# Patient Record
Sex: Female | Born: 1965
Health system: Southern US, Community
[De-identification: ages and names within clinical notes are randomized; demographics above are authoritative.]

## PROBLEM LIST (undated history)

## (undated) DIAGNOSIS — F329 Major depressive disorder, single episode, unspecified: Secondary | ICD-10-CM

## (undated) DIAGNOSIS — I10 Essential (primary) hypertension: Secondary | ICD-10-CM

## (undated) DIAGNOSIS — B009 Herpesviral infection, unspecified: Secondary | ICD-10-CM

## (undated) DIAGNOSIS — I509 Heart failure, unspecified: Secondary | ICD-10-CM

## (undated) DIAGNOSIS — B019 Varicella without complication: Secondary | ICD-10-CM

## (undated) DIAGNOSIS — D649 Anemia, unspecified: Secondary | ICD-10-CM

## (undated) DIAGNOSIS — F32A Depression, unspecified: Secondary | ICD-10-CM

## (undated) DIAGNOSIS — F419 Anxiety disorder, unspecified: Secondary | ICD-10-CM

## (undated) DIAGNOSIS — N760 Acute vaginitis: Secondary | ICD-10-CM

## (undated) DIAGNOSIS — B9689 Other specified bacterial agents as the cause of diseases classified elsewhere: Secondary | ICD-10-CM

## (undated) DIAGNOSIS — E559 Vitamin D deficiency, unspecified: Secondary | ICD-10-CM

## (undated) HISTORY — DX: Anemia, unspecified: D64.9

## (undated) HISTORY — DX: Varicella without complication: B01.9

## (undated) HISTORY — DX: Vitamin D deficiency, unspecified: E55.9

## (undated) HISTORY — DX: Major depressive disorder, single episode, unspecified: F32.9

## (undated) HISTORY — DX: Herpesviral infection, unspecified: B00.9

## (undated) HISTORY — DX: Depression, unspecified: F32.A

## (undated) HISTORY — DX: Other specified bacterial agents as the cause of diseases classified elsewhere: B96.89

## (undated) HISTORY — DX: Heart failure, unspecified: I50.9

## (undated) HISTORY — PX: FRACTURE SURGERY: SHX138

## (undated) HISTORY — PX: AUGMENTATION MAMMAPLASTY: SUR837

## (undated) HISTORY — PX: APPENDECTOMY: SHX54

## (undated) HISTORY — DX: Essential (primary) hypertension: I10

## (undated) HISTORY — DX: Anxiety disorder, unspecified: F41.9

## (undated) HISTORY — DX: Other specified bacterial agents as the cause of diseases classified elsewhere: N76.0

---

## 2004-10-19 ENCOUNTER — Encounter: Payer: Self-pay | Admitting: Orthopedic Surgery

## 2004-10-30 ENCOUNTER — Encounter: Payer: Self-pay | Admitting: Orthopedic Surgery

## 2007-05-22 ENCOUNTER — Other Ambulatory Visit: Payer: Self-pay

## 2007-05-22 ENCOUNTER — Emergency Department: Payer: Self-pay | Admitting: Emergency Medicine

## 2013-08-30 ENCOUNTER — Emergency Department: Payer: Self-pay | Admitting: Emergency Medicine

## 2016-09-28 ENCOUNTER — Emergency Department
Admission: EM | Admit: 2016-09-28 | Discharge: 2016-09-28 | Disposition: A | Discharge status: Home / Self Care | Payer: BLUE CROSS/BLUE SHIELD | Attending: Emergency Medicine | Admitting: Emergency Medicine

## 2016-09-28 ENCOUNTER — Encounter: Payer: Self-pay | Admitting: Emergency Medicine

## 2016-09-28 DIAGNOSIS — F17200 Nicotine dependence, unspecified, uncomplicated: Secondary | ICD-10-CM | POA: Insufficient documentation

## 2016-09-28 DIAGNOSIS — R112 Nausea with vomiting, unspecified: Secondary | ICD-10-CM | POA: Diagnosis present

## 2016-09-28 DIAGNOSIS — R197 Diarrhea, unspecified: Secondary | ICD-10-CM | POA: Diagnosis not present

## 2016-09-28 LAB — URINALYSIS, COMPLETE (UACMP) WITH MICROSCOPIC
Bacteria, UA: NONE SEEN
Bilirubin Urine: NEGATIVE
Glucose, UA: NEGATIVE mg/dL
Ketones, ur: NEGATIVE mg/dL
Leukocytes, UA: NEGATIVE
Nitrite: NEGATIVE
Protein, ur: NEGATIVE mg/dL
Specific Gravity, Urine: 1.016 (ref 1.005–1.030)
pH: 5 (ref 5.0–8.0)

## 2016-09-28 LAB — COMPREHENSIVE METABOLIC PANEL
ALT: 15 U/L (ref 14–54)
AST: 24 U/L (ref 15–41)
Albumin: 4.3 g/dL (ref 3.5–5.0)
Alkaline Phosphatase: 72 U/L (ref 38–126)
Anion gap: 8 (ref 5–15)
BUN: 17 mg/dL (ref 6–20)
CO2: 24 mmol/L (ref 22–32)
Calcium: 9.1 mg/dL (ref 8.9–10.3)
Chloride: 102 mmol/L (ref 101–111)
Creatinine, Ser: 0.67 mg/dL (ref 0.44–1.00)
GFR calc Af Amer: >60 mL/min (ref >60)
GFR calc non Af Amer: >60 mL/min (ref >60)
Glucose, Bld: 97 mg/dL (ref 65–99)
Potassium: 3.8 mmol/L (ref 3.5–5.1)
Sodium: 134 mmol/L — ABNORMAL LOW (ref 135–145)
Total Bilirubin: 0.8 mg/dL (ref 0.3–1.2)
Total Protein: 8 g/dL (ref 6.5–8.1)

## 2016-09-28 LAB — CBC
HCT: 44.2 % (ref 35.0–47.0)
Hemoglobin: 15.2 g/dL (ref 12.0–16.0)
MCH: 32.2 pg (ref 26.0–34.0)
MCHC: 34.3 g/dL (ref 32.0–36.0)
MCV: 93.8 fL (ref 80.0–100.0)
Platelets: 302 10*3/uL (ref 150–440)
RBC: 4.71 MIL/uL (ref 3.80–5.20)
RDW: 13.1 % (ref 11.5–14.5)
WBC: 7.9 10*3/uL (ref 3.6–11.0)

## 2016-09-28 LAB — LIPASE, BLOOD: Lipase: 24 U/L (ref 11–51)

## 2016-09-28 MED ORDER — ONDANSETRON HCL 4 MG/2ML IJ SOLN
4.0000 mg | Freq: Once | INTRAMUSCULAR | Status: AC
  Administered 2016-09-28: 4 mg via INTRAVENOUS
  Filled 2016-09-28: qty 2

## 2016-09-28 MED ORDER — SODIUM CHLORIDE 0.9 % IV BOLUS (SEPSIS)
1000.0000 mL | Freq: Once | INTRAVENOUS | Status: AC
  Administered 2016-09-28: 1000 mL via INTRAVENOUS

## 2016-09-28 MED ORDER — LOPERAMIDE HCL 2 MG PO CAPS
2.0000 mg | ORAL_CAPSULE | ORAL | Status: AC
  Administered 2016-09-28: 2 mg via ORAL
  Filled 2016-09-28: qty 1

## 2016-09-28 MED ORDER — ONDANSETRON 4 MG PO TBDP: 4.0000 mg | ORAL_TABLET | Freq: Four times a day (QID) | ORAL | 0 refills | Status: DC | PRN

## 2016-09-28 NOTE — Discharge Instructions (Signed)

## 2016-09-28 NOTE — ED Triage Notes (Signed)
Patient to ER for N/V/D x 2 days. Patient states a couple people at work have been sick and have had to go home. Patient reports 2 episodes per day. Patient states she feels fine, but then eats and vomits 45 mins later.

## 2016-09-28 NOTE — ED Provider Notes (Signed)
[facility] Emergency Department Provider Note   ____________________________________________   First MD Initiated Contact with Patient 09/28/16 0800     (approximate)  I have reviewed the triage vital signs and the nursing notes.   HISTORY  Chief Complaint Emesis and Diarrhea    HPI Shelly Stevenson is a 51 y.o. female reports she became sick on about Wednesday. Several people she is worked with have been sick with nausea and vomiting recently. She reports on Wednesday morning after eating cereal she suddenly became very nauseated and began to vomit multiple times. She's vomited several times each day mostly when trying to eat. She reports that yesterday she did vomit 2 times. Vomits up food and water. No black or bloody vomit or diarrhea. She does reports she's had loose stools, a couple daily for the last few days. Denies being in any pain or discomfort however, except uncomfortable when she does vomit.  No fevers or chills. No abdominal pain. Denies pregnancy. No chest pain or trouble breathing. Reports she feels "dehydrated".   History reviewed. No pertinent past medical history.  There are no active problems to display for this patient.   Past Surgical History:  Procedure Laterality Date  . APPENDECTOMY    . FRACTURE SURGERY     Left thumb    Prior to Admission medications   Medication Sig Start Date End Date Taking? Authorizing Provider  ondansetron (ZOFRAN ODT) 4 MG disintegrating tablet Take 1 tablet (4 mg total) by mouth every 6 (six) hours as needed for nausea or vomiting. 09/28/16   Sharyn Creamer, MD    Allergies Penicillins  No family history on file.  Social History Social History  Substance Use Topics  . Smoking status: Current Every Day Smoker  . Smokeless tobacco: Never Used  . Alcohol use No    Review of Systems Constitutional: No fever/chills Eyes: No visual changes. ENT: No sore throat. Cardiovascular: Denies chest  pain. Respiratory: Denies shortness of breath. Gastrointestinal: No abdominal pain.  Genitourinary: Negative for dysuria. Musculoskeletal: Negative for back pain. Skin: Negative for rash. Neurological: Negative for headaches, focal weakness or numbness.    ____________________________________________   PHYSICAL EXAM:  VITAL SIGNS: ED Triage Vitals  Enc Vitals Group     BP 09/28/16 0714 (!) 146/94     Pulse Rate 09/28/16 0714 77     Resp 09/28/16 0714 18     Temp 09/28/16 0714 98.4 F (36.9 C)     Temp Source 09/28/16 0714 Oral     SpO2 09/28/16 0714 97 %     Weight 09/28/16 0716 155 lb (70.3 kg)     Height 09/28/16 0716 5\' 5"  (1.651 m)     Head Circumference --      Peak Flow --      Pain Score --      Pain Loc --      Pain Edu? --      Excl. in GC? --     Constitutional: Alert and oriented. Well appearing and in no acute distress.Very pleasant. Eyes: Conjunctivae are normal. Head: Atraumatic. Nose: No congestion/rhinnorhea. Mouth/Throat: Mucous membranes are moist though slightly tacky. Neck: No stridor.   Cardiovascular: Normal rate, regular rhythm. Grossly normal heart sounds.  Good peripheral circulation. Respiratory: Normal respiratory effort.  No retractions. Lungs CTAB. Gastrointestinal: Soft and nontender. No distention. Negative Murphy. No rebound or guarding in any quadrant. Careful abdominal exam reveals no tenderness or discomfort elicited. No pain in McBurney's point. Musculoskeletal: No  lower extremity tenderness nor edema. Neurologic:  Normal speech and language. No gross focal neurologic deficits are appreciated.  Skin:  Skin is warm, dry and intact. No rash noted. Psychiatric: Mood and affect are normal. Speech and behavior are normal.  ____________________________________________   LABS (all labs ordered are listed, but only abnormal results are displayed)  Labs Reviewed  COMPREHENSIVE METABOLIC PANEL - Abnormal; Notable for the following:        Result Value   Sodium 134 (*)    All other components within normal limits  URINALYSIS, COMPLETE (UACMP) WITH MICROSCOPIC - Abnormal; Notable for the following:    Color, Urine YELLOW (*)    APPearance CLEAR (*)    Hgb urine dipstick MODERATE (*)    Squamous Epithelial / LPF 0-5 (*)    All other components within normal limits  LIPASE, BLOOD  CBC   ____________________________________________  EKG   ____________________________________________  RADIOLOGY   ____________________________________________   PROCEDURES  Procedure(s) performed: None  Procedures  Critical Care performed: No  ____________________________________________   INITIAL IMPRESSION / ASSESSMENT AND PLAN / ED COURSE  Pertinent labs & imaging results that were available during my care of the patient were reviewed by me and considered in my medical decision making (see chart for details).  Nausea vomiting and diarrhea. Nontoxic and well-appearing. Afebrile. Very reassuring examination with no evidence of an acute abdomen noted. Patient reports no pain but reports nausea with vomiting after eating and loose stools. Most likely a self-limited situation possibly a foodborne illness or gastroenteritis. No risk factors for C. difficile or bacterial infectious diarrheas.  Discussed with patient, we will hydrate her carefully. Reassess for ability to take by mouth prior to plans for discharge with careful return precautions.  ----------------------------------------- 9:24 AM on 09/28/2016 -----------------------------------------  Patient resting comfortably. No complaints or concerns.   I discussed careful return precautions with the patient. She is very agreeable.      ____________________________________________   FINAL CLINICAL IMPRESSION(S) / ED DIAGNOSES  Final diagnoses:  Nausea vomiting and diarrhea      NEW MEDICATIONS STARTED DURING THIS VISIT:  New Prescriptions   ONDANSETRON  (ZOFRAN ODT) 4 MG DISINTEGRATING TABLET    Take 1 tablet (4 mg total) by mouth every 6 (six) hours as needed for nausea or vomiting.     Note:  This document was prepared using Dragon voice recognition software and may include unintentional dictation errors.     Sharyn CreamerQuale, Tanja Gift, MD 09/28/16 [phone]

## 2017-02-17 ENCOUNTER — Other Ambulatory Visit: Payer: Self-pay | Admitting: Family Medicine

## 2017-02-17 DIAGNOSIS — Z1231 Encounter for screening mammogram for malignant neoplasm of breast: Secondary | ICD-10-CM

## 2017-03-19 ENCOUNTER — Emergency Department
Admission: EM | Admit: 2017-03-19 | Discharge: 2017-03-19 | Disposition: A | Discharge status: Home / Self Care | Payer: BLUE CROSS/BLUE SHIELD | Attending: Emergency Medicine | Admitting: Emergency Medicine

## 2017-03-19 ENCOUNTER — Encounter: Payer: Self-pay | Admitting: Emergency Medicine

## 2017-03-19 DIAGNOSIS — F17200 Nicotine dependence, unspecified, uncomplicated: Secondary | ICD-10-CM | POA: Insufficient documentation

## 2017-03-19 DIAGNOSIS — B349 Viral infection, unspecified: Secondary | ICD-10-CM | POA: Diagnosis not present

## 2017-03-19 DIAGNOSIS — J069 Acute upper respiratory infection, unspecified: Secondary | ICD-10-CM | POA: Diagnosis not present

## 2017-03-19 DIAGNOSIS — B9789 Other viral agents as the cause of diseases classified elsewhere: Secondary | ICD-10-CM

## 2017-03-19 DIAGNOSIS — R0981 Nasal congestion: Secondary | ICD-10-CM | POA: Diagnosis present

## 2017-03-19 MED ORDER — BENZONATATE 200 MG PO CAPS: 200.0000 mg | ORAL_CAPSULE | Freq: Three times a day (TID) | ORAL | 0 refills | Status: DC | PRN

## 2017-03-19 NOTE — Discharge Instructions (Signed)
Follow-up with your regular doctor or the acute care if you are worsening in 3-5 days, take it over-the-counter medications as needed, a prescription for Jerilynn Somessalon Perles has been given to you there for cough, return to the emergency department if your worsening

## 2017-03-19 NOTE — ED Triage Notes (Signed)
Patient presents to ED via POV from home. Patient states for the past week she has had the flu, she was diagnosed by her mother who is a Engineer, civil (consulting)nurse. Patient here today because she is still not feeling better. Patient reports nasal congestion. Afebrile here.

## 2017-03-19 NOTE — ED Provider Notes (Signed)
[facility] Emergency Department Provider Note  ____________________________________________   First MD Initiated Contact with Patient 03/19/17 1053     (approximate)  I have reviewed the triage vital signs and the nursing notes.   HISTORY  Chief Complaint Nasal Congestion    HPI Shelly Stevenson is a 51 y.o. female complains of cough and congestion with fever, states her fever broke last night, her temperature had been as high as 103, states she felt like she had the flu that she had the flu vaccine, the mucus is mainly clear but with some yellow, states she is feeling better than she did yesterday and will need a note to return to work, she denies vomiting or diarrhea  History reviewed. No pertinent past medical history.  There are no active problems to display for this patient.   Past Surgical History:  Procedure Laterality Date  . APPENDECTOMY    . FRACTURE SURGERY     Left thumb    Prior to Admission medications   Medication Sig Start Date End Date Taking? Authorizing Provider  benzonatate (TESSALON) 200 MG capsule Take 1 capsule (200 mg total) by mouth 3 (three) times daily as needed for cough. 03/19/17   Sherrie MustacheFisher, Roselyn BeringSusan W, PA-C  ondansetron (ZOFRAN ODT) 4 MG disintegrating tablet Take 1 tablet (4 mg total) by mouth every 6 (six) hours as needed for nausea or vomiting. 09/28/16   Sharyn CreamerQuale, Mark, MD    Allergies Penicillins  No family history on file.  Social History Social History   Tobacco Use  . Smoking status: Current Every Day Smoker  . Smokeless tobacco: Never Used  Substance Use Topics  . Alcohol use: No  . Drug use: No    Review of Systems  Constitutional: Positive fever/chills Eyes: No visual changes. ENT: No sore throat. Respiratory: Positive cough Genitourinary: Negative for dysuria. Musculoskeletal: Negative for back pain. Skin: Negative for rash.    ____________________________________________   PHYSICAL  EXAM:  VITAL SIGNS: ED Triage Vitals  Enc Vitals Group     BP 03/19/17 1040 (!) 153/101     Pulse Rate 03/19/17 1040 78     Resp 03/19/17 1040 15     Temp 03/19/17 1040 98 F (36.7 C)     Temp src --      SpO2 03/19/17 1040 99 %     Weight 03/19/17 1040 160 lb (72.6 kg)     Height 03/19/17 1040 5\' 6"  (1.676 m)     Head Circumference --      Peak Flow --      Pain Score 03/19/17 1101 3     Pain Loc --      Pain Edu? --      Excl. in GC? --     Constitutional: Alert and oriented. Well appearing and in no acute distress. Eyes: Conjunctivae are normal.  Head: Atraumatic. Nose: No congestion/rhinnorhea. Mouth/Throat: Mucous membranes are moist.  Throat is normal Cardiovascular: Normal rate, regular rhythm.  Heart sounds are normal Respiratory: Normal respiratory effort.  No retractions, lungs are clear to auscultation GU: deferred Musculoskeletal: FROM all extremities, warm and well perfused Neurologic:  Normal speech and language.  Skin:  Skin is warm, dry and intact. No rash noted. Psychiatric: Mood and affect are normal. Speech and behavior are normal.  ____________________________________________   LABS (all labs ordered are listed, but only abnormal results are displayed)  Labs Reviewed - No data to display ____________________________________________   ____________________________________________  RADIOLOGY  ____________________________________________   PROCEDURES  Procedure(s) performed: No      ____________________________________________   INITIAL IMPRESSION / ASSESSMENT AND PLAN / ED COURSE  Pertinent labs & imaging results that were available during my care of the patient were reviewed by me and considered in my medical decision making (see chart for details).  Patient is a 51 year old female who has flulike symptoms, since she has been sick for so many days a flu swab was not done, she states she feels better and is afebrile today, she  basically needs a work note and something for the cough, diagnosis is acute upper respiratory infection viral, she is to follow-up with her regular doctor if she is not better in 3-5 days, a work note was given, a prescription for [employer] Petroleum[employer]alon Perles 200 mg 3 times a day as needed for cough was also given, patient states she understands and will comply with the discharge instructions, she was discharged in stable condition      ____________________________________________   FINAL CLINICAL IMPRESSION(S) / ED DIAGNOSES  Final diagnoses:  Viral URI with cough      NEW MEDICATIONS STARTED DURING THIS VISIT:  This SmartLink is deprecated. Use AVSMEDLIST instead to display the medication list for a patient.   Note:  This document was prepared using Dragon voice recognition software and may include unintentional dictation errors.    Faythe GheeFisher, Susan W, PA-C 03/19/17 1119    Emily FilbertWilliams, Jonathan E, MD 03/19/17 [phone]

## 2017-03-19 NOTE — ED Notes (Signed)
Mask applied.

## 2017-03-24 ENCOUNTER — Other Ambulatory Visit (HOSPITAL_COMMUNITY)
Admission: RE | Admit: 2017-03-24 | Discharge: 2017-03-24 | Disposition: A | Discharge status: Home / Self Care | Payer: BLUE CROSS/BLUE SHIELD | Source: Ambulatory Visit | Attending: Internal Medicine | Admitting: Internal Medicine

## 2017-03-24 ENCOUNTER — Ambulatory Visit: Payer: BLUE CROSS/BLUE SHIELD | Admitting: Internal Medicine

## 2017-03-24 ENCOUNTER — Encounter: Payer: Self-pay | Admitting: Internal Medicine

## 2017-03-24 VITALS — BP 140/90 | HR 98 | Temp 98.1°F | Ht 65.25 in | Wt 169.1 lb

## 2017-03-24 DIAGNOSIS — F419 Anxiety disorder, unspecified: Secondary | ICD-10-CM | POA: Diagnosis not present

## 2017-03-24 DIAGNOSIS — Z0000 Encounter for general adult medical examination without abnormal findings: Secondary | ICD-10-CM

## 2017-03-24 DIAGNOSIS — M2550 Pain in unspecified joint: Secondary | ICD-10-CM | POA: Diagnosis not present

## 2017-03-24 DIAGNOSIS — Z113 Encounter for screening for infections with a predominantly sexual mode of transmission: Secondary | ICD-10-CM

## 2017-03-24 DIAGNOSIS — Z1211 Encounter for screening for malignant neoplasm of colon: Secondary | ICD-10-CM | POA: Insufficient documentation

## 2017-03-24 DIAGNOSIS — Z780 Asymptomatic menopausal state: Secondary | ICD-10-CM

## 2017-03-24 DIAGNOSIS — D649 Anemia, unspecified: Secondary | ICD-10-CM

## 2017-03-24 DIAGNOSIS — R232 Flushing: Secondary | ICD-10-CM | POA: Diagnosis not present

## 2017-03-24 DIAGNOSIS — G4700 Insomnia, unspecified: Secondary | ICD-10-CM | POA: Diagnosis not present

## 2017-03-24 DIAGNOSIS — Z1231 Encounter for screening mammogram for malignant neoplasm of breast: Secondary | ICD-10-CM | POA: Diagnosis not present

## 2017-03-24 DIAGNOSIS — F339 Major depressive disorder, recurrent, unspecified: Secondary | ICD-10-CM | POA: Diagnosis not present

## 2017-03-24 DIAGNOSIS — Z1239 Encounter for other screening for malignant neoplasm of breast: Secondary | ICD-10-CM

## 2017-03-24 DIAGNOSIS — R252 Cramp and spasm: Secondary | ICD-10-CM

## 2017-03-24 DIAGNOSIS — G5603 Carpal tunnel syndrome, bilateral upper limbs: Secondary | ICD-10-CM

## 2017-03-24 DIAGNOSIS — L409 Psoriasis, unspecified: Secondary | ICD-10-CM

## 2017-03-24 LAB — CBC WITH DIFFERENTIAL/PLATELET
Basophils Absolute: 62 cells/uL (ref 0–200)
Basophils Relative: 0.9 %
Eosinophils Absolute: 207 cells/uL (ref 15–500)
Eosinophils Relative: 3 %
HCT: 41 % (ref 35.0–45.0)
Hemoglobin: 14 g/dL (ref 11.7–15.5)
Lymphs Abs: 1787 cells/uL (ref 850–3900)
MCH: 31.8 pg (ref 27.0–33.0)
MCHC: 34.1 g/dL (ref 32.0–36.0)
MCV: 93.2 fL (ref 80.0–100.0)
MPV: 8.7 fL (ref 7.5–12.5)
Monocytes Relative: 7.8 %
Neutro Abs: 4306 cells/uL (ref 1500–7800)
Neutrophils Relative %: 62.4 %
Platelets: 284 10*3/uL (ref 140–400)
RBC: 4.4 10*6/uL (ref 3.80–5.10)
RDW: 12.9 % (ref 11.0–15.0)
Total Lymphocyte: 25.9 %
WBC mixed population: 538 cells/uL (ref 200–950)
WBC: 6.9 10*3/uL (ref 3.8–10.8)

## 2017-03-24 LAB — COMPREHENSIVE METABOLIC PANEL
AG Ratio: 1.6 (calc) (ref 1.0–2.5)
ALT: 14 U/L (ref 6–29)
AST: 18 U/L (ref 10–35)
Albumin: 4.9 g/dL (ref 3.6–5.1)
Alkaline phosphatase (APISO): 85 U/L (ref 33–130)
BUN: 15 mg/dL (ref 7–25)
CO2: 29 mmol/L (ref 20–32)
Calcium: 9.7 mg/dL (ref 8.6–10.4)
Chloride: 100 mmol/L (ref 98–110)
Creat: 0.75 mg/dL (ref 0.50–1.05)
Globulin: 3 g/dL (calc) (ref 1.9–3.7)
Glucose, Bld: 100 mg/dL — ABNORMAL HIGH (ref 65–99)
Potassium: 4.4 mmol/L (ref 3.5–5.3)
Sodium: 135 mmol/L (ref 135–146)
Total Bilirubin: 0.3 mg/dL (ref 0.2–1.2)
Total Protein: 7.9 g/dL (ref 6.1–8.1)

## 2017-03-24 MED ORDER — ZOLPIDEM TARTRATE 5 MG PO TABS: 5.0000 mg | ORAL_TABLET | Freq: Every evening | ORAL | 1 refills | Status: DC | PRN

## 2017-03-24 MED ORDER — ESCITALOPRAM OXALATE 10 MG PO TABS: 10.0000 mg | ORAL_TABLET | Freq: Every day | ORAL | 0 refills | Status: DC

## 2017-03-24 MED ORDER — TRIAMCINOLONE ACETONIDE 0.1 % EX CREA: TOPICAL_CREAM | CUTANEOUS | 1 refills | Status: DC

## 2017-03-24 MED ORDER — VENLAFAXINE HCL ER 37.5 MG PO CP24: 37.5000 mg | ORAL_CAPSULE | Freq: Every day | ORAL | 1 refills | Status: DC

## 2017-03-24 MED ORDER — CLOBETASOL PROPIONATE 0.05 % EX SOLN: 1.0000 "application " | Freq: Two times a day (BID) | CUTANEOUS | 0 refills | Status: DC | PRN

## 2017-03-24 MED ORDER — ALPRAZOLAM 0.5 MG PO TABS: 0.5000 mg | ORAL_TABLET | Freq: Every day | ORAL | 1 refills | Status: DC | PRN

## 2017-03-24 NOTE — Patient Instructions (Addendum)
Try Zeasorb AF Powder over the counter for sweating to keep you dry under breasts  Take Lexapro 10 mg x 1 week then stop and start Effexor daily in am    Venlafaxine extended-release capsules What is this medicine? VENLAFAXINE(VEN la fax een) is used to treat depression, anxiety and panic disorder. This medicine may be used for other purposes; ask your health care provider or pharmacist if you have questions. COMMON BRAND NAME(S): Effexor XR What should I tell my health care provider before I take this medicine? They need to know if you have any of these conditions: -bleeding disorders -glaucoma -heart disease -high blood pressure -high cholesterol -kidney disease -liver disease -low levels of sodium in the blood -mania or bipolar disorder -seizures -suicidal thoughts, plans, or attempt; a previous suicide attempt by you or a family -take medicines that treat or prevent blood clots -thyroid disease -an unusual or allergic reaction to venlafaxine, desvenlafaxine, other medicines, foods, dyes, or preservatives -pregnant or trying to get pregnant -breast-feeding How should I use this medicine? Take this medicine by mouth with a full glass of water. Follow the directions on the prescription label. Do not cut, crush, or chew this medicine. Take it with food. If needed, the capsule may be carefully opened and the entire contents sprinkled on a spoonful of cool applesauce. Swallow the applesauce/pellet mixture right away without chewing and follow with a glass of water to ensure complete swallowing of the pellets. Try to take your medicine at about the same time each day. Do not take your medicine more often than directed. Do not stop taking this medicine suddenly except upon the advice of your doctor. Stopping this medicine too quickly may cause serious side effects or your condition may worsen. A special MedGuide will be given to you by the pharmacist with each prescription and refill. Be sure  to read this information carefully each time. Talk to your pediatrician regarding the use of this medicine in children. Special care may be needed. Overdosage: If you think you have taken too much of this medicine contact a poison control center or emergency room at once. NOTE: This medicine is only for you. Do not share this medicine with others. What if I miss a dose? If you miss a dose, take it as soon as you can. If it is almost time for your next dose, take only that dose. Do not take double or extra doses. What may interact with this medicine? Do not take this medicine with any of the following medications: -certain medicines for fungal infections like fluconazole, itraconazole, ketoconazole, posaconazole, voriconazole -cisapride -desvenlafaxine -dofetilide -dronedarone -duloxetine -levomilnacipran -linezolid -MAOIs like Carbex, Eldepryl, Marplan, Nardil, and Parnate -methylene blue (injected into a vein) -milnacipran -pimozide -thioridazine -ziprasidone This medicine may also interact with the following medications: -amphetamines -aspirin and aspirin-like medicines -certain medicines for depression, anxiety, or psychotic disturbances -certain medicines for migraine headaches like almotriptan, eletriptan, frovatriptan, naratriptan, rizatriptan, sumatriptan, zolmitriptan -certain medicines for sleep -certain medicines that treat or prevent blood clots like dalteparin, enoxaparin, warfarin -cimetidine -clozapine -diuretics -fentanyl -furazolidone -indinavir -isoniazid -lithium -metoprolol -NSAIDS, medicines for pain and inflammation, like ibuprofen or naproxen -other medicines that prolong the QT interval (cause an abnormal heart rhythm) -procarbazine -rasagiline -supplements like St. John's wort, kava kava, valerian -tramadol -tryptophan This list may not describe all possible interactions. Give your health care provider a list of all the medicines, herbs,  non-prescription drugs, or dietary supplements you use. Also tell them if you smoke, drink alcohol,  or use illegal drugs. Some items may interact with your medicine. What should I watch for while using this medicine? Tell your doctor if your symptoms do not get better or if they get worse. Visit your doctor or health care professional for regular checks on your progress. Because it may take several weeks to see the full effects of this medicine, it is important to continue your treatment as prescribed by your doctor. Patients and their families should watch out for new or worsening thoughts of suicide or depression. Also watch out for sudden changes in feelings such as feeling anxious, agitated, panicky, irritable, hostile, aggressive, impulsive, severely restless, overly excited and hyperactive, or not being able to sleep. If this happens, especially at the beginning of treatment or after a change in dose, call your health care professional. This medicine can cause an increase in blood pressure. Check with your doctor for instructions on monitoring your blood pressure while taking this medicine. You may get drowsy or dizzy. Do not drive, use machinery, or do anything that needs mental alertness until you know how this medicine affects you. Do not stand or sit up quickly, especially if you are an older patient. This reduces the risk of dizzy or fainting spells. Alcohol may interfere with the effect of this medicine. Avoid alcoholic drinks. Your mouth may get dry. Chewing sugarless gum, sucking hard candy and drinking plenty of water will help. Contact your doctor if the problem does not go away or is severe. What side effects may I notice from receiving this medicine? Side effects that you should report to your doctor or health care professional as soon as possible: -allergic reactions like skin rash, itching or hives, swelling of the face, lips, or tongue -anxious -breathing  problems -confusion -changes in vision -chest pain -confusion -elevated mood, decreased need for sleep, racing thoughts, impulsive behavior -eye pain -fast, irregular heartbeat -feeling faint or lightheaded, falls -feeling agitated, angry, or irritable -hallucination, loss of contact with reality -high blood pressure -loss of balance or coordination -palpitations -redness, blistering, peeling or loosening of the skin, including inside the mouth -restlessness, pacing, inability to keep still -seizures -stiff muscles -suicidal thoughts or other mood changes -trouble passing urine or change in the amount of urine -trouble sleeping -unusual bleeding or bruising -unusually weak or tired -vomiting Side effects that usually do not require medical attention (report to your doctor or health care professional if they continue or are bothersome): -change in sex drive or performance -change in appetite or weight -constipation -dizziness -dry mouth -headache -increased sweating -nausea -tired This list may not describe all possible side effects. Call your doctor for medical advice about side effects. You may report side effects to FDA at 1-800-FDA-1088. Where should I keep my medicine? Keep out of the reach of children. Store at a controlled temperature between 20 and 25 degrees C (68 degrees and 77 degrees F), in a dry place. Throw away any unused medicine after the expiration date. NOTE: This sheet is a summary. It may not cover all possible information. If you have questions about this medicine, talk to your doctor, pharmacist, or health care provider.  2018 Elsevier/Gold Standard (2015-08-17 18:38:02)  Intertrigo Intertrigo is skin irritation (inflammation) that happens in warm, moist areas of the body. The irritation can cause a rash and make skin raw and itchy. The rash is usually pink or red. It happens mostly between folds of skin or where skin rubs together, such  as:  Toes.  Armpits.  Groin.  Belly.  Breasts.  Buttocks.  This condition is not passed from person to person (is not contagious). Follow these instructions at home:  Keep the affected area clean and dry.  Do not scratch your skin.  Stay cool as much as possible. Use an air conditioner or fan, if you can.  Apply over-the-counter and prescription medicines only as told by your doctor.  If you were prescribed an antibiotic medicine, use it as told by your doctor. Do not stop using the antibiotic even if your condition starts to get better.  Keep all follow-up visits as told by your doctor. This is important. How is this prevented?  Stay at a healthy weight.  Keep your feet dry. This is very important if you have diabetes. Wear cotton or wool socks.  Take care of and protect the skin in your groin and butt area as told by your doctor.  Do not wear tight clothes. Wear clothes that: ? Are loose. ? Take away moisture from your body. ? Are made of cotton.  Wear a bra that gives good support, if needed.  Shower and dry yourself fully after being active.  Keep your blood sugar under control if you have diabetes. Contact a doctor if:  Your symptoms do not get better with treatment.  Your symptoms get worse or they spread.  You notice more redness and warmth.  You have a fever. This information is not intended to replace advice given to you by your health care provider. Make sure you discuss any questions you have with your health care provider. Document Released: 04/20/2010 Document Revised: 08/24/2015 Document Reviewed: 09/19/2014 Elsevier Interactive Patient Education  [employer]2018 [employer]

## 2017-03-24 NOTE — Progress Notes (Signed)
Chief Complaint  Patient presents with  . Establish Care   New patient establish care 1. Agreeable to STD check  2. Psoriasis to scalp, ears, b/l elbows, face, dx'ed age 51 y.o  3. C/o muscle cramps in thighs at times and lower leg edema with working 12 hour shifts 4. C/o numbness/tingling b/l hands she thinks she has CTS b/l  5. Anxiety/depression/stress/memory/insomnia problems uncontrolled but she wants to go off Lexapro on 20 mg qd now PHQ 9 score 11. She has appt with Dr. Suzie Portela 04/2017 but last time she called he could not work her in and she does not want to see him again. She takes Xanax 0.5 1/2 to 1 pill qd prn and Ambien 10 mg qhs for sleep. Advised her max dose for women is 5 mg and will change the dose. She has tried Melatonin in the past it gave her nightmares, Buspar caused wt gain , nausea, Zoloft and Paxil did not work.  6. C/o hot flashes and spotted with cycle in the summer. She wants to try medication for hot flashes   Review of Systems  Constitutional: Negative for fever and weight loss.  HENT: Negative for hearing loss.   Eyes:       No vision problems   Respiratory: Negative for shortness of breath.   Cardiovascular: Positive for leg swelling. Negative for chest pain.  Gastrointestinal: Negative for abdominal pain and blood in stool.  Genitourinary:       +hot flashes   Musculoskeletal: Positive for joint pain.  Skin: Positive for rash.  Psychiatric/Behavioral: Positive for depression and memory loss. The patient is nervous/anxious and has insomnia.    Past Medical History:  Diagnosis Date  . Anemia    with PICA sx's   . Anxiety   . Chicken pox   . Depression    Past Surgical History:  Procedure Laterality Date  . APPENDECTOMY     51 y.o.   . FRACTURE SURGERY     Left thumb   Family History  Problem Relation Age of Onset  . Arthritis Mother   . Cancer Mother        breast  . Hearing loss Mother   . Hypertension Mother   . Arthritis Father   .  Cancer Father        colon dx'ed age 45s   . Hypertension Father   . Heart disease Father        MI  . Diabetes Father   . Mental illness Son        [facility]   Social History   Socioeconomic History  . Marital status: Divorced    Spouse name: Not on file  . Number of children: Not on file  . Years of education: Not on file  . Highest education level: Not on file  Social Needs  . Financial resource strain: Not on file  . Food insecurity - worry: Not on file  . Food insecurity - inability: Not on file  . Transportation needs - medical: Not on file  . Transportation needs - non-medical: Not on file  Occupational History  . Not on file  Tobacco Use  . Smoking status: Current Every Day Smoker  . Smokeless tobacco: Never Used  . Tobacco comment: <1ppd x 30 years no FH lung cancer   Substance and Sexual Activity  . Alcohol use: Yes    Comment: socially per pt   . Drug use: No  . Sexual activity: Yes  Comment: men  Other Topics Concern  . 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 ALPRAZolam (XANAX) 0.5 MG tablet Take 1 tablet (0.5 mg total) by mouth daily as needed for anxiety.  . benzonatate (TESSALON) 200 MG capsule Take 1 capsule (200 mg total) by mouth 3 (three) times daily as needed for cough.  . escitalopram (LEXAPRO) 10 MG tablet Take 1 tablet (10 mg total) by mouth daily. X 7 days then stop  . ondansetron (ZOFRAN ODT) 4 MG disintegrating tablet Take 1 tablet (4 mg total) by mouth every 6 (six) hours as needed for nausea or vomiting.  Marland Kitchen. zolpidem (AMBIEN) 5 MG tablet Take 1 tablet (5 mg total) by mouth at bedtime as needed for sleep.  . [DISCONTINUED] ALPRAZolam (XANAX) 0.5 MG tablet   . [DISCONTINUED] escitalopram (LEXAPRO) 20 MG tablet   . [DISCONTINUED] zolpidem (AMBIEN) 10 MG tablet    Allergies   Allergen Reactions  . Penicillins Swelling    Angioedema  . Codeine Rash    Elevated heartrate   No results found for this or any previous visit (from the past 2160 hour(s)). Objective  Body mass index is 27.93 kg/m. Wt Readings from Last 3 Encounters:  03/24/17 169 lb 2 oz (76.7 kg)  03/19/17 160 lb (72.6 kg)  09/28/16 155 lb (70.3 kg)   Temp Readings from Last 3 Encounters:  03/24/17 98.1 F (36.7 C) (Oral)  03/19/17 98 F (36.7 C)  09/28/16 98.4 F (36.9 C) (Oral)   BP Readings from Last 3 Encounters:  03/24/17 140/90  03/19/17 (!) 143/97  09/28/16 (!) 157/73   Pulse Readings from Last 3 Encounters:  03/24/17 98  03/19/17 78  09/28/16 63   O2 sat room air 96%  Physical Exam  Constitutional: She is oriented to person, place, and time and well-developed, well-nourished, and in no distress. Vital signs are normal.  HENT:  Head: Normocephalic and atraumatic.  Mouth/Throat: Oropharynx is clear and moist and mucous membranes are normal.  Mild flakiness behind right ear   Eyes: Conjunctivae are normal. Pupils are equal, round, and reactive to light.  Cardiovascular: Normal rate, regular rhythm and normal heart sounds.  No murmur heard. Neg leg edema b/l   Pulmonary/Chest: Effort normal and breath sounds normal.  Abdominal: Soft. Bowel sounds are normal. There is no tenderness.  Neurological: She is alert and oriented to person, place, and time. Gait normal. Gait normal.  Skin: Skin is warm, dry and intact.  Psychiatric: Mood, memory, affect and judgment normal.  Nursing note and vitals reviewed.   Assessment   1. Wellness  2. Anxiety/depression PHQ 9 score 11 today/insomnia/memory problems  3. Psoriasis ? Psoriatic arthritis worse on elbows b/l r/o other  4. Muscle cramps  5. C/w CTS b/l  6. Menopausal sx's  7. HM  Plan  1.  Check labs CMET, CBC, UA, TSH, T4, FSH, anemia labs, RA labs, hep B, STD check  Will need to check lipid at f/u not fasting   2. Taper off lexapro 20 >10 mg qd. Start effexor 37.5 room to inc. If needed in future Reduced ambien from 10 mg to 5 qhs prn  Prn Xanax 0.5   Refer to [facility] further tx Does not want to see Dr. Suzie PortelaMoffitt any longer  3. W/u as above  Trial TMC if this does not help increase strength with different agent   Clobetasol to scalp prn   Refer to dermatology   4. Labs as above  Increase hydration  5.  Rx wrist splints qhs and at work  6.  Check FSH sx's  Trial effexor XL 7.  Had flu and Tdap  Disc shingrix pt wants to hold on getting mom got shingles after shingles vaccine   Smoker <1ppd x 30 years no FH lung cancer does not want to quit rec cessation.   Drinks etoh "socially", no drugs   Will do pap at f/u no h/o abnormal per pt cycles irregular and spotted this summer   Refer colonoscopy Dr. Servando SnareWohl  Refer dermatology [facility]   [facility]   rec Zeasorb AF to intertrigo areas   Provider: Dr. French Anaracy McLean-Scocuzza-[facility]

## 2017-03-25 LAB — URINALYSIS, ROUTINE W REFLEX MICROSCOPIC
Bacteria, UA: NONE SEEN /HPF
Bilirubin Urine: NEGATIVE
Glucose, UA: NEGATIVE
Hyaline Cast: NONE SEEN /LPF
Ketones, ur: NEGATIVE
Leukocytes, UA: NEGATIVE
Nitrite: NEGATIVE
Protein, ur: NEGATIVE
Specific Gravity, Urine: 1.015 (ref 1.001–1.03)
Squamous Epithelial / LPF: NONE SEEN /HPF (ref <=5)
WBC, UA: NONE SEEN /HPF (ref 0–5)
pH: 7 (ref 5.0–8.0)

## 2017-03-26 ENCOUNTER — Other Ambulatory Visit: Payer: Self-pay | Admitting: Internal Medicine

## 2017-03-26 DIAGNOSIS — Z113 Encounter for screening for infections with a predominantly sexual mode of transmission: Secondary | ICD-10-CM

## 2017-03-27 LAB — URINE CYTOLOGY ANCILLARY ONLY
Chlamydia: NEGATIVE
Neisseria Gonorrhea: NEGATIVE
Trichomonas: NEGATIVE

## 2017-03-29 LAB — TSH: TSH: 3.04 mIU/L

## 2017-03-29 LAB — ANA: Anti Nuclear Antibody(ANA): POSITIVE — AB

## 2017-03-29 LAB — HSV 1 ANTIBODY, IGG: HSV 1 Glycoprotein G Ab, IgG: <0.9 index

## 2017-03-29 LAB — HIV ANTIBODY (ROUTINE TESTING W REFLEX): HIV 1&2 Ab, 4th Generation: NONREACTIVE

## 2017-03-29 LAB — IRON,TIBC AND FERRITIN PANEL
%SAT: 22 % (calc) (ref 11–50)
Ferritin: 32 ng/mL (ref 10–232)
Iron: 89 ug/dL (ref 45–160)
TIBC: 408 mcg/dL (calc) (ref 250–450)

## 2017-03-29 LAB — HSV 2 ANTIBODY, IGG: HSV 2 Glycoprotein G Ab, IgG: 23 index — ABNORMAL HIGH

## 2017-03-29 LAB — SEDIMENTATION RATE: Sed Rate: 11 mm/h (ref 0–30)

## 2017-03-29 LAB — TEST AUTHORIZATION

## 2017-03-29 LAB — HSV 1/2 AB (IGM), IFA W/RFLX TITER
HSV 1 IgM Screen: NEGATIVE
HSV 2 IgM Screen: NEGATIVE

## 2017-03-29 LAB — HEPATITIS B SURFACE ANTIGEN: Hepatitis B Surface Ag: NONREACTIVE

## 2017-03-29 LAB — T4, FREE: Free T4: 1 ng/dL (ref 0.8–1.8)

## 2017-03-29 LAB — HEPATITIS C ANTIBODY
Hepatitis C Ab: NONREACTIVE
SIGNAL TO CUT-OFF: 0.05 (ref <1.00)

## 2017-03-29 LAB — CYCLIC CITRUL PEPTIDE ANTIBODY, IGG: Cyclic Citrullin Peptide Ab: <16 UNITS

## 2017-03-29 LAB — RHEUMATOID FACTOR: Rhuematoid fact SerPl-aCnc: <14 IU/mL (ref <14)

## 2017-03-29 LAB — ANTI-NUCLEAR AB-TITER (ANA TITER): ANA Titer 1: 1:80 {titer} — ABNORMAL HIGH

## 2017-03-29 LAB — C-REACTIVE PROTEIN: CRP: 2.5 mg/L (ref <8.0)

## 2017-03-29 LAB — RPR: RPR Ser Ql: NONREACTIVE

## 2017-03-29 LAB — HEPATITIS B CORE ANTIBODY, TOTAL: Hep B Core Total Ab: NONREACTIVE

## 2017-03-29 LAB — HEPATITIS B SURFACE ANTIBODY, QUANTITATIVE: Hepatitis B-Post: <5 m[IU]/mL — ABNORMAL LOW (ref >=10)

## 2017-03-29 LAB — FOLLICLE STIMULATING HORMONE: FSH: 86.4 m[IU]/mL

## 2017-04-02 ENCOUNTER — Other Ambulatory Visit: Payer: Self-pay | Admitting: Internal Medicine

## 2017-04-02 DIAGNOSIS — N760 Acute vaginitis: Principal | ICD-10-CM

## 2017-04-02 DIAGNOSIS — B9689 Other specified bacterial agents as the cause of diseases classified elsewhere: Secondary | ICD-10-CM

## 2017-04-02 LAB — URINE CYTOLOGY ANCILLARY ONLY: Candida vaginitis: NEGATIVE

## 2017-04-02 MED ORDER — METRONIDAZOLE 500 MG PO TABS: 500.0000 mg | ORAL_TABLET | Freq: Two times a day (BID) | ORAL | 0 refills | Status: DC

## 2017-04-15 ENCOUNTER — Other Ambulatory Visit: Payer: Self-pay

## 2017-04-15 DIAGNOSIS — Z1211 Encounter for screening for malignant neoplasm of colon: Secondary | ICD-10-CM

## 2017-04-16 ENCOUNTER — Telehealth: Payer: Self-pay

## 2017-04-16 NOTE — Telephone Encounter (Signed)
Gastroenterology Pre-Procedure Review  Request Date: 05/02/17 Requesting Physician: Dr. Maximino Greenlandahiliani  PATIENT REVIEW QUESTIONS: The patient responded to the following health history questions as indicated:    1. Are you having any GI issues? no 2. Do you have a personal history of Polyps? no 3. Do you have a family history of Colon Cancer or Polyps? yes (Dad colon cancer) 4. Diabetes Mellitus? no 5. Joint replacements in the past 12 months?no 6. Major health problems in the past 3 months?no 7. Any artificial heart valves, MVP, or defibrillator?no    MEDICATIONS & ALLERGIES:    Patient reports the following regarding taking any anticoagulation/antiplatelet therapy:   Plavix, Coumadin, Eliquis, Xarelto, Lovenox, Pradaxa, Brilinta, or Effient? no Aspirin? no  Patient confirms/reports the following medications:  Current Outpatient Medications  Medication Sig Dispense Refill  . ALPRAZolam (XANAX) 0.5 MG tablet Take 1 tablet (0.5 mg total) by mouth daily as needed for anxiety. 30 tablet 1  . benzonatate (TESSALON) 200 MG capsule Take 1 capsule (200 mg total) by mouth 3 (three) times daily as needed for cough. 30 capsule 0  . clobetasol (TEMOVATE) 0.05 % external solution Apply 1 application topically 2 (two) times daily as needed. Scalp 50 mL 0  . escitalopram (LEXAPRO) 10 MG tablet Take 1 tablet (10 mg total) by mouth daily. X 7 days then stop 7 tablet 0  . metroNIDAZOLE (FLAGYL) 500 MG tablet Take 1 tablet (500 mg total) by mouth 2 (two) times daily. 14 tablet 0  . ondansetron (ZOFRAN ODT) 4 MG disintegrating tablet Take 1 tablet (4 mg total) by mouth every 6 (six) hours as needed for nausea or vomiting. 20 tablet 0  . triamcinolone cream (KENALOG) 0.1 % Bid elbows avoid face/groin/private 454 g 1  . venlafaxine XR (EFFEXOR XR) 37.5 MG 24 hr capsule Take 1 capsule (37.5 mg total) by mouth daily with breakfast. 30 capsule 1  . zolpidem (AMBIEN) 5 MG tablet Take 1 tablet (5 mg total) by mouth  at bedtime as needed for sleep. 30 tablet 1   No current facility-administered medications for this visit.     Patient confirms/reports the following allergies:  Allergies  Allergen Reactions  . Penicillins Swelling    Angioedema  . Codeine Rash    Elevated heartrate    No orders of the defined types were placed in this encounter.   AUTHORIZATION INFORMATION Primary Insurance: 1D#: Group #:  Secondary Insurance: 1D#: Group #:  SCHEDULE INFORMATION: Date: 05/02/17 Time: Location:[facility]

## 2017-04-28 ENCOUNTER — Other Ambulatory Visit (HOSPITAL_COMMUNITY)
Admission: RE | Admit: 2017-04-28 | Discharge: 2017-04-28 | Disposition: A | Discharge status: Home / Self Care | Payer: BLUE CROSS/BLUE SHIELD | Source: Ambulatory Visit | Attending: Internal Medicine | Admitting: Internal Medicine

## 2017-04-28 ENCOUNTER — Ambulatory Visit (INDEPENDENT_AMBULATORY_CARE_PROVIDER_SITE_OTHER): Payer: BLUE CROSS/BLUE SHIELD | Admitting: Internal Medicine

## 2017-04-28 ENCOUNTER — Encounter: Payer: Self-pay | Admitting: Internal Medicine

## 2017-04-28 VITALS — BP 136/90 | HR 76 | Temp 98.4°F | Ht 62.5 in | Wt 175.2 lb

## 2017-04-28 DIAGNOSIS — F419 Anxiety disorder, unspecified: Secondary | ICD-10-CM

## 2017-04-28 DIAGNOSIS — R7689 Other specified abnormal immunological findings in serum: Secondary | ICD-10-CM | POA: Insufficient documentation

## 2017-04-28 DIAGNOSIS — Z13220 Encounter for screening for lipoid disorders: Secondary | ICD-10-CM | POA: Diagnosis not present

## 2017-04-28 DIAGNOSIS — Z124 Encounter for screening for malignant neoplasm of cervix: Secondary | ICD-10-CM

## 2017-04-28 DIAGNOSIS — R768 Other specified abnormal immunological findings in serum: Secondary | ICD-10-CM | POA: Insufficient documentation

## 2017-04-28 DIAGNOSIS — Z23 Encounter for immunization: Secondary | ICD-10-CM

## 2017-04-28 MED ORDER — ALPRAZOLAM 0.5 MG PO TABS: 0.5000 mg | ORAL_TABLET | Freq: Every day | ORAL | 1 refills | Status: DC | PRN

## 2017-04-28 NOTE — Patient Instructions (Signed)
Please follow in 4 months  Ive already referred you to dermatology, psychiatry and mammogram [facility] these  Take care   Hepatitis B Vaccine, Recombinant injection What is this medicine? HEPATITIS B VACCINE (hep uh TAHY tis B VAK seen) is a vaccine. It is used to prevent an infection with the hepatitis B virus. This medicine may be used for other purposes; ask your health care provider or pharmacist if you have questions. COMMON BRAND NAME(S): Engerix-B, Recombivax HB What should I tell my health care provider before I take this medicine? They need to know if you have any of these conditions: -fever, infection -heart disease -hepatitis B infection -immune system problems -kidney disease -an unusual or allergic reaction to vaccines, yeast, other medicines, foods, dyes, or preservatives -pregnant or trying to get pregnant -breast-feeding How should I use this medicine? This vaccine is for injection into a muscle. It is given by a health care professional. A copy of Vaccine Information Statements will be given before each vaccination. Read this sheet carefully each time. The sheet may change frequently. Talk to your pediatrician regarding the use of this medicine in children. While this drug may be prescribed for children as young as newborn for selected conditions, precautions do apply. Overdosage: If you think you have taken too much of this medicine contact a poison control center or emergency room at once. NOTE: This medicine is only for you. Do not share this medicine with others. What if I miss a dose? It is important not to miss your dose. Call your doctor or health care professional if you are unable to keep an appointment. What may interact with this medicine? -medicines that suppress your immune function like adalimumab, anakinra, infliximab -medicines to treat cancer -steroid medicines like prednisone or cortisone This list may not describe all possible  interactions. Give your health care provider a list of all the medicines, herbs, non-prescription drugs, or dietary supplements you use. Also tell them if you smoke, drink alcohol, or use illegal drugs. Some items may interact with your medicine. What should I watch for while using this medicine? See your health care provider for all shots of this vaccine as directed. You must have 3 shots of this vaccine for protection from hepatitis B infection. Tell your doctor right away if you have any serious or unusual side effects after getting this vaccine. What side effects may I notice from receiving this medicine? Side effects that you should report to your doctor or health care professional as soon as possible: -allergic reactions like skin rash, itching or hives, swelling of the face, lips, or tongue -breathing problems -confused, irritated -fast, irregular heartbeat -flu-like syndrome -numb, tingling pain -seizures -unusually weak or tired Side effects that usually do not require medical attention (report to your doctor or health care professional if they continue or are bothersome): -diarrhea -fever -headache -loss of appetite -muscle pain -nausea -pain, redness, swelling, or irritation at site where injected -tiredness This list may not describe all possible side effects. Call your doctor for medical advice about side effects. You may report side effects to FDA at 1-800-FDA-1088. Where should I keep my medicine? This drug is given in a hospital or clinic and will not be stored at home. NOTE: This sheet is a summary. It may not cover all possible information. If you have questions about this medicine, talk to your doctor, pharmacist, or health care provider.  2018 Elsevier/Gold Standard (2013-07-19 13:26:01)

## 2017-04-28 NOTE — Progress Notes (Signed)
Chief Complaint  Patient presents with  . Follow-up   Follow up  1. Wants hep B injection  2. Reviewed labs HSV 2 + already knew about exposure per pt and had only 1 outbreak, +ANA 1:80 titer needs to f/u with rheumatology in the future  3. H/o BV last noted on urine tests tx'ed Flagyl x 1 week    Review of Systems  Constitutional: Negative for weight loss.  HENT: Negative for hearing loss.   Respiratory: Negative for shortness of breath.   Cardiovascular: Negative for chest pain.  Gastrointestinal: Negative for abdominal pain.  Skin: Negative for rash.  Psychiatric/Behavioral: The patient is nervous/anxious.    Past Medical History:  Diagnosis Date  . Anemia    with PICA sx's   . Anxiety   . Chicken pox   . Depression   . HSV (herpes simplex virus) infection    Past Surgical History:  Procedure Laterality Date  . APPENDECTOMY     52 y.o.   . FRACTURE SURGERY     Left thumb   Family History  Problem Relation Age of Onset  . Arthritis Mother   . Cancer Mother        breast  . Hearing loss Mother   . Hypertension Mother   . Arthritis Father   . Cancer Father        colon dx'ed age 6s   . Hypertension Father   . Heart disease Father        MI  . Diabetes Father   . Mental illness Son        [facility]   Social History   Socioeconomic History  . Marital status: Divorced    Spouse name: Not on file  . Number of children: Not on file  . Years of education: Not on file  . Highest education level: Not on file  Social Needs  . Financial resource strain: Not on file  . Food insecurity - worry: Not on file  . Food insecurity - inability: Not on file  . Transportation needs - medical: Not on file  . Transportation needs - non-medical: Not on file  Occupational History  . Not on file  Tobacco Use  . Smoking status: Current Every Day Smoker  . Smokeless tobacco: Never Used  . Tobacco comment: <1ppd x 30 years no FH lung cancer   Substance and Sexual Activity  .  Alcohol use: Yes    Comment: socially per pt   . Drug use: No  . Sexual activity: Yes    Comment: men  Other Topics Concern  . 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 ALPRAZolam (XANAX) 0.5 MG tablet Take 1 tablet (0.5 mg total) by mouth daily as needed for anxiety.  . benzonatate (TESSALON) 200 MG capsule Take 1 capsule (200 mg total) by mouth 3 (three) times daily as needed for cough.  . clobetasol (TEMOVATE) 0.05 % external solution Apply 1 application topically 2 (two) times daily as needed. Scalp  . ondansetron (ZOFRAN ODT) 4 MG disintegrating tablet Take 1 tablet (4 mg total) by mouth every 6 (six) hours as needed for nausea or vomiting.  . triamcinolone cream (KENALOG) 0.1 % Bid elbows avoid face/groin/private  .  venlafaxine XR (EFFEXOR XR) 37.5 MG 24 hr capsule Take 1 capsule (37.5 mg total) by mouth daily with breakfast.  . zolpidem (AMBIEN) 5 MG tablet Take 1 tablet (5 mg total) by mouth at bedtime as needed for sleep.  . [DISCONTINUED] ALPRAZolam (XANAX) 0.5 MG tablet Take 1 tablet (0.5 mg total) by mouth daily as needed for anxiety.   Allergies  Allergen Reactions  . Penicillins Swelling    Angioedema  . Codeine Rash    Elevated heartrate   Recent Results (from the past 2160 hour(s))  Urine cytology ancillary only     Status: None   Collection Time: 03/24/17 12:00 AM  Result Value Ref Range   Chlamydia Negative     Comment: Normal Reference Range - Negative   Neisseria gonorrhea Negative     Comment: Normal Reference Range - Negative   Trichomonas Negative     Comment: Normal Reference Range - Negative  Urine cytology ancillary only     Status: Abnormal   Collection Time: 03/24/17 12:00 AM  Result Value Ref Range   Bacterial vaginitis (A)     **POSITIVE for Atopobium vaginae, POSITIVE for  Megasphaera 1, POSITIVE for Gardnerella vaginalis, POSITIVE for BVAB2**    Comment: Normal Reference Range - Negative   Candida vaginitis Negative for Candida Vaginitis Microorganisms     Comment: Normal Reference Range - Negative  Urinalysis, Routine w reflex microscopic     Status: Abnormal   Collection Time: 03/24/17 12:13 PM  Result Value Ref Range   Color, Urine YELLOW YELLOW   APPearance CLEAR CLEAR   Specific Gravity, Urine 1.015 1.001 - 1.03   pH 7.0 5.0 - 8.0   Glucose, UA NEGATIVE NEGATIVE   Bilirubin Urine NEGATIVE NEGATIVE   Ketones, ur NEGATIVE NEGATIVE   Hgb urine dipstick TRACE (A) NEGATIVE   Protein, ur NEGATIVE NEGATIVE   Nitrite NEGATIVE NEGATIVE   Leukocytes, UA NEGATIVE NEGATIVE   WBC, UA NONE SEEN 0 - 5 /HPF   RBC / HPF 0-2 0 - 2 /HPF   Squamous Epithelial / LPF NONE SEEN < OR = 5 /HPF   Bacteria, UA NONE SEEN NONE SEEN /HPF   Hyaline Cast NONE SEEN NONE SEEN /LPF  TSH     Status: None   Collection Time: 03/24/17 12:14 PM  Result Value Ref Range   TSH 3.04 mIU/L    Comment:           Reference Range .           > or = 20 Years  0.40-4.50 .                Pregnancy Ranges           First trimester    0.26-2.66           Second trimester   0.55-2.73           Third trimester    0.43-2.91   T4, free     Status: None   Collection Time: 03/24/17 12:14 PM  Result Value Ref Range   Free T4 1.0 0.8 - 1.8 ng/dL  Antinuclear Antib (ANA)     Status: Abnormal   Collection Time: 03/24/17 12:14 PM  Result Value Ref Range   Anit Nuclear Antibody(ANA) POSITIVE (A) NEGATIVE    Comment: ANA IFA is a first line screen for detecting the presence of up to approximately 150 autoantibodies in various autoimmune diseases. A positive ANA IFA result is suggestive of autoimmune  disease and reflexes to titer and pattern. Further laboratory testing may be considered if clinically indicated. . Visit Physician FAQs for interpretation of all antibodies in the [place],  prevalence, and association with diseases at http://education.[facility].com/ [email or web address] .   Sedimentation rate     Status: None   Collection Time: 03/24/17 12:14 PM  Result Value Ref Range   Sed Rate 11 0 - 30 mm/h  C-reactive protein     Status: None   Collection Time: 03/24/17 12:14 PM  Result Value Ref Range   CRP 2.5 <8.0 mg/L  Cyclic citrul peptide antibody, IgG     Status: None   Collection Time: 03/24/17 12:14 PM  Result Value Ref Range   Cyclic Citrullin Peptide Ab <40 UNITS    Comment: Reference Range Negative:            <20 Weak Positive:       20-39 Moderate Positive:   40-59 Strong Positive:     >59 .   Rheumatoid Factor     Status: None   Collection Time: 03/24/17 12:14 PM  Result Value Ref Range   Rhuematoid fact SerPl-aCnc <14 <14 IU/mL  FSH     Status: None   Collection Time: 03/24/17 12:14 PM  Result Value Ref Range   FSH 86.4 mIU/mL    Comment:                     Reference Range .              Follicular Phase       2.5-10.2              Mid-cycle Peak         3.1-17.7              Luteal Phase           1.5- 9.1              Postmenopausal       23.0-116.3              .   Iron, TIBC and Ferritin Panel     Status: None   Collection Time: 03/24/17 12:14 PM  Result Value Ref Range   Iron 89 45 - 160 mcg/dL   TIBC 981 191 - 478 mcg/dL (calc)   %SAT 22 11 - 50 % (calc)   Ferritin 32 10 - 232 ng/mL  RPR     Status: None   Collection Time: 03/24/17 12:14 PM  Result Value Ref Range   RPR Ser Ql NON-REACTIVE NON-REACTI  HIV antibody     Status: None   Collection Time: 03/24/17 12:14 PM  Result Value Ref Range   HIV 1&2 Ab, 4th Generation NON-REACTIVE NON-REACTI    Comment: HIV-1 antigen and HIV-1/HIV-2 antibodies were not detected. There is no laboratory evidence of HIV infection. Marland Kitchen PLEASE NOTE: This information has been disclosed to you from records whose confidentiality may be protected by state law.  If your state requires  such protection, then the state law prohibits you from making any further disclosure of the information without the specific written consent of the person to whom it pertains, or as otherwise permitted by law. A general authorization for the release of medical or other information is NOT sufficient for this purpose. . For additional information please refer to http://education.[facility].com/faq/FAQ106 (This link is being provided for informational/ educational purposes only.) . Marland Kitchen The performance of this assay has not been  clinically validated in patients less than 63 years old. .   Hepatitis B surface antibody     Status: Abnormal   Collection Time: 03/24/17 12:14 PM  Result Value Ref Range   Hepatitis B-Post <5 (L) > OR = 10 mIU/mL    Comment: . Patient does not have immunity to hepatitis B virus. . For additional information, please refer to http://education.[facility].com/faq/FAQ105 (This link is being provided for informational/ educational purposes only).   Hepatitis B core antibody, total     Status: None   Collection Time: 03/24/17 12:14 PM  Result Value Ref Range   Hep B Core Total Ab NON-REACTIVE NON-REACTI  Hepatitis C antibody     Status: None   Collection Time: 03/24/17 12:14 PM  Result Value Ref Range   Hepatitis C Ab NON-REACTIVE NON-REACTI   SIGNAL TO CUT-OFF 0.05 <1.00  Hepatitis B surface antigen     Status: None   Collection Time: 03/24/17 12:14 PM  Result Value Ref Range   Hepatitis B Surface Ag NON-REACTIVE NON-REACTI  HSV 2 antibody, IgG     Status: Abnormal   Collection Time: 03/24/17 12:14 PM  Result Value Ref Range   HSV 2 Glycoprotein G Ab, IgG 23.00 (H) index    Comment:                           Index          Interpretation                           -----          --------------                           <0.90          Negative                           0.90-1.09      Equivocal                           >1.09           Positive . This assay utilizes recombinant type-specific antigens to differentiate HSV-1 from HSV-2 infections. A positive result cannot distinguish between recent and past infection. If recent HSV infection is suspected but the results are negative or equivocal, the assay should be repeated in 4-6 weeks. The performance characteristics of the assay have not been established for pediatric populations, immunocompromised patients, or neonatal screening.   HSV 1/2 Ab (IgM), IFA w/rflx Titer     Status: None   Collection Time: 03/24/17 12:14 PM  Result Value Ref Range   HSV 1 IgM Screen Negative Negative   HSV 2 IgM Screen Negative Negative    Comment: . The IFA procedure for measuring IgM antibodies to HSV 1 and HSV 2 detects both type-common and type-specific HSV antibodies. Thus, IgM reactivity to both HSV 1 and HSV 2 may represent crossreactive HSV antibodies rather than exposure to both HSV 1 and HSV 2. . This test was developed and its analytical performance characteristics have been determined by [employer], [place], [place]. It has not been cleared or approved by the FDA. This assay has been validated pursuant to the CLIA regulations and is used for clinical purposes. Marland Kitchen  HSV 1 antibody, IgG     Status: None   Collection Time: 03/24/17 12:14 PM  Result Value Ref Range   HSV 1 Glycoprotein G Ab, IgG <0.90 index    Comment:                           Index          Interpretation                           -----          --------------                           <0.90          Negative                           0.90-1.09      Equivocal                           >1.09          Positive . This assay utilizes recombinant type-specific antigens to differentiate HSV-1 from HSV-2 infections. A positive result cannot distinguish between recent and past infection. If recent HSV infection is suspected but the results are negative or equivocal, the  assay should be repeated in 4-6 weeks. The performance characteristics of the assay have not been established for pediatric populations, immunocompromised patients, or neonatal screening.   TEST AUTHORIZATION     Status: None   Collection Time: 03/24/17 12:14 PM  Result Value Ref Range   TEST NAME: HSV 1 IGG, TYPE SPECIFIC HSV     TEST CODE: [id number]    CLIENT CONTACT: LATOYA WRIGHT    REPORT ALWAYS MESSAGE SIGNATURE      Comment: . The laboratory testing on this patient was verbally requested or confirmed by the ordering physician or his or her authorized representative after contact with an employee of [employer]. Federal regulations require that we maintain on file written authorization for all laboratory testing.  Accordingly we are asking that the ordering physician or his or her authorized representative sign a copy of this report and promptly return it to the client service representative. . . Signature:____________________________________________________ . Please fax this signed page to [phone] or return it via your [employer] courier.   Anti-nuclear ab-titer (ANA titer)     Status: Abnormal   Collection Time: 03/24/17 12:14 PM  Result Value Ref Range   ANA Pattern 1 NUCLEOLAR (A)     Comment: Nucleolar pattern is associated with systemic sclerosis (scleroderma), systemic sclerosis/ polymyositis overlap and Sjogren's syndrome.    ANA Titer 1 1:80 (H) titer    Comment: A low level ANA titer may be present in pre-clinical autoimmune diseases and normal individuals.                 Reference Range                 <1:40        Negative                 1:40-1:80    Low Antibody Level                 >1:80  Elevated Antibody Level .   Comprehensive metabolic panel     Status: Abnormal   Collection Time: 03/24/17 12:16 PM  Result Value Ref Range   Glucose, Bld 100 (H) 65 - 99 mg/dL    Comment: .            Fasting reference  interval . For someone without known diabetes, a glucose value between 100 and 125 mg/dL is consistent with prediabetes and should be confirmed with a follow-up test. .    BUN 15 7 - 25 mg/dL   Creat 1.61 0.96 - 0.45 mg/dL    Comment: For patients >19 years of age, the reference limit for Creatinine is approximately 13% higher for people identified as African-American. .    BUN/Creatinine Ratio NOT APPLICABLE 6 - 22 (calc)   Sodium 135 135 - 146 mmol/L   Potassium 4.4 3.5 - 5.3 mmol/L   Chloride 100 98 - 110 mmol/L   CO2 29 20 - 32 mmol/L   Calcium 9.7 8.6 - 10.4 mg/dL   Total Protein 7.9 6.1 - 8.1 g/dL   Albumin 4.9 3.6 - 5.1 g/dL   Globulin 3.0 1.9 - 3.7 g/dL (calc)   AG Ratio 1.6 1.0 - 2.5 (calc)   Total Bilirubin 0.3 0.2 - 1.2 mg/dL   Alkaline phosphatase (APISO) 85 33 - 130 U/L   AST 18 10 - 35 U/L   ALT 14 6 - 29 U/L  CBC with Differential/Platelet     Status: None   Collection Time: 03/24/17 12:16 PM  Result Value Ref Range   WBC 6.9 3.8 - 10.8 Thousand/uL   RBC 4.40 3.80 - 5.10 Million/uL   Hemoglobin 14.0 11.7 - 15.5 g/dL   HCT 40.9 81.1 - 91.4 %   MCV 93.2 80.0 - 100.0 fL   MCH 31.8 27.0 - 33.0 pg   MCHC 34.1 32.0 - 36.0 g/dL   RDW 78.2 95.6 - 21.3 %   Platelets 284 140 - 400 Thousand/uL   MPV 8.7 7.5 - 12.5 fL   Neutro Abs 4,306 1,500 - 7,800 cells/uL   Lymphs Abs 1,787 850 - 3,900 cells/uL   WBC mixed population 538 200 - 950 cells/uL   Eosinophils Absolute 207 15 - 500 cells/uL   Basophils Absolute 62 0 - 200 cells/uL   Neutrophils Relative % 62.4 %   Total Lymphocyte 25.9 %   Monocytes Relative 7.8 %   Eosinophils Relative 3.0 %   Basophils Relative 0.9 %   Objective  Body mass index is 31.54 kg/m. Wt Readings from Last 3 Encounters:  04/28/17 175 lb 4 oz (79.5 kg)  03/24/17 169 lb 2 oz (76.7 kg)  03/19/17 160 lb (72.6 kg)   Temp Readings from Last 3 Encounters:  04/28/17 98.4 F (36.9 C) (Oral)  03/24/17 98.1 F (36.7 C) (Oral)   03/19/17 98 F (36.7 C)   BP Readings from Last 3 Encounters:  04/28/17 136/90  03/24/17 140/90  03/19/17 (!) 143/97   Pulse Readings from Last 3 Encounters:  04/28/17 76  03/24/17 98  03/19/17 78   O2 sat room air 97%  Physical Exam  Constitutional: She is oriented to person, place, and time and well-developed, well-nourished, and in no distress. Vital signs are normal.  HENT:  Head: Normocephalic and atraumatic.  Mouth/Throat: Oropharynx is clear and moist and mucous membranes are normal.  Cardiovascular: Normal rate, regular rhythm and normal heart sounds.  Pulmonary/Chest: Effort normal and breath sounds normal.  Abdominal: Soft. Bowel  sounds are normal. There is no tenderness.  Genitourinary: Uterus normal, cervix normal, right adnexa normal, left adnexa normal and vulva normal. Thin  odorless  white and vaginal discharge found.  Genitourinary Comments: Mild bleeding from cervix  Neurological: She is alert and oriented to person, place, and time. Gait normal. Gait normal.  Skin: Skin is warm, dry and intact.  Psychiatric: Mood, memory, affect and judgment normal.  Nursing note and vitals reviewed.   Assessment   1. Anxiety  2. HSV 2 known to pt only 1 outbreak in past  3. Depression/anxiety  4. +ANA titer 1:80  5. Psoriasis  6. H m Plan  1. Refilled Xanax  2. NTd monitor  3. Cont meds  Refilled xanax  Has not heard about psych referral  4. Consider rheum referral at f/u  5. Pt has not heard about dermatology referral needs tbse as well  6.  Had flu and Tdap  Given hep B vaccine today will need in 2 months and 6 months   Pap today  Has not heard from mammo referral  Colonoscopy Friday  rec smoking cessation  Zeasorb af to skin folds  Check with Efraim Kaufmannmelissa about referrals   Provider: Dr. French Anaracy McLean-Scocuzza-[facility]

## 2017-04-29 ENCOUNTER — Other Ambulatory Visit (INDEPENDENT_AMBULATORY_CARE_PROVIDER_SITE_OTHER): Payer: BLUE CROSS/BLUE SHIELD

## 2017-04-29 DIAGNOSIS — Z13220 Encounter for screening for lipoid disorders: Secondary | ICD-10-CM

## 2017-04-29 LAB — LIPID PANEL
Cholesterol: 204 mg/dL — ABNORMAL HIGH (ref 0–200)
HDL: 75.2 mg/dL (ref >39.00)
LDL Cholesterol: 106 mg/dL — ABNORMAL HIGH (ref 0–99)
NonHDL: 128.84
Total CHOL/HDL Ratio: 3
Triglycerides: 112 mg/dL (ref 0.0–149.0)
VLDL: 22.4 mg/dL (ref 0.0–40.0)

## 2017-04-30 LAB — CYTOLOGY - PAP
Bacterial vaginitis: POSITIVE — AB
Candida vaginitis: NEGATIVE
Diagnosis: NEGATIVE
Diagnosis: REACTIVE
HPV: NOT DETECTED

## 2017-05-01 ENCOUNTER — Telehealth: Payer: Self-pay | Admitting: Internal Medicine

## 2017-05-01 NOTE — Telephone Encounter (Signed)
Pt given results per notes of Dr Marty HeckMcLean-Socuzza on 04/29/17 Unable to document in result note due to result note not being routed to [facility].

## 2017-05-01 NOTE — Telephone Encounter (Signed)
Please advise 

## 2017-05-01 NOTE — Telephone Encounter (Signed)
Medication is not on current list. Patient last OV [id number]

## 2017-05-01 NOTE — Telephone Encounter (Signed)
Pt asking for a RX for Phentramine.

## 2017-05-02 ENCOUNTER — Ambulatory Visit: Payer: BLUE CROSS/BLUE SHIELD | Admitting: Anesthesiology

## 2017-05-02 ENCOUNTER — Encounter
Admission: RE | Disposition: A | Discharge status: Home / Self Care | Payer: Self-pay | Source: Ambulatory Visit | Attending: Gastroenterology

## 2017-05-02 ENCOUNTER — Ambulatory Visit
Admission: RE | Admit: 2017-05-02 | Discharge: 2017-05-02 | Disposition: A | Discharge status: Home / Self Care | Payer: BLUE CROSS/BLUE SHIELD | Source: Ambulatory Visit | Attending: Gastroenterology | Admitting: Gastroenterology

## 2017-05-02 DIAGNOSIS — F329 Major depressive disorder, single episode, unspecified: Secondary | ICD-10-CM | POA: Diagnosis not present

## 2017-05-02 DIAGNOSIS — Z79899 Other long term (current) drug therapy: Secondary | ICD-10-CM | POA: Insufficient documentation

## 2017-05-02 DIAGNOSIS — F419 Anxiety disorder, unspecified: Secondary | ICD-10-CM | POA: Insufficient documentation

## 2017-05-02 DIAGNOSIS — K5730 Diverticulosis of large intestine without perforation or abscess without bleeding: Secondary | ICD-10-CM | POA: Diagnosis not present

## 2017-05-02 DIAGNOSIS — Z1211 Encounter for screening for malignant neoplasm of colon: Secondary | ICD-10-CM | POA: Diagnosis not present

## 2017-05-02 DIAGNOSIS — F17200 Nicotine dependence, unspecified, uncomplicated: Secondary | ICD-10-CM | POA: Diagnosis not present

## 2017-05-02 HISTORY — PX: COLONOSCOPY WITH PROPOFOL: SHX5780

## 2017-05-02 LAB — POCT PREGNANCY, URINE: Preg Test, Ur: NEGATIVE

## 2017-05-02 SURGERY — COLONOSCOPY WITH PROPOFOL
Anesthesia: General

## 2017-05-02 MED ORDER — LIDOCAINE HCL (PF) 1 % IJ SOLN
INTRAMUSCULAR | Status: AC
  Administered 2017-05-02: 0.3 mL via INTRADERMAL
  Filled 2017-05-02: qty 2

## 2017-05-02 MED ORDER — FENTANYL CITRATE (PF) 100 MCG/2ML IJ SOLN
INTRAMUSCULAR | Status: AC
  Filled 2017-05-02: qty 2

## 2017-05-02 MED ORDER — PROPOFOL 10 MG/ML IV BOLUS
INTRAVENOUS | Status: DC | PRN
  Administered 2017-05-02: 100 mg via INTRAVENOUS

## 2017-05-02 MED ORDER — LIDOCAINE HCL (PF) 1 % IJ SOLN
2.0000 mL | Freq: Once | INTRAMUSCULAR | Status: AC
  Administered 2017-05-02: 0.3 mL via INTRADERMAL

## 2017-05-02 MED ORDER — SODIUM CHLORIDE 0.9 % IV SOLN
INTRAVENOUS | Status: DC
  Administered 2017-05-02: 10:00:00 via INTRAVENOUS
  Administered 2017-05-02: 1000 mL via INTRAVENOUS

## 2017-05-02 MED ORDER — FENTANYL CITRATE (PF) 100 MCG/2ML IJ SOLN
INTRAMUSCULAR | Status: DC | PRN
  Administered 2017-05-02 (×2): 50 ug via INTRAVENOUS

## 2017-05-02 MED ORDER — PHENYLEPHRINE HCL 10 MG/ML IJ SOLN
INTRAMUSCULAR | Status: DC | PRN
  Administered 2017-05-02: 100 ug via INTRAVENOUS

## 2017-05-02 MED ORDER — LIDOCAINE 2% (20 MG/ML) 5 ML SYRINGE
INTRAMUSCULAR | Status: DC | PRN
  Administered 2017-05-02: 30 mg via INTRAVENOUS

## 2017-05-02 MED ORDER — PROPOFOL 500 MG/50ML IV EMUL
INTRAVENOUS | Status: DC | PRN
  Administered 2017-05-02: 140 ug/kg/min via INTRAVENOUS

## 2017-05-02 MED ORDER — PROPOFOL 500 MG/50ML IV EMUL
INTRAVENOUS | Status: AC
  Filled 2017-05-02: qty 50

## 2017-05-02 NOTE — Anesthesia Post-op Follow-up Note (Signed)
Anesthesia QCDR form completed.        

## 2017-05-02 NOTE — Anesthesia Postprocedure Evaluation (Signed)
Anesthesia Post Note  Patient: Shelly Stevenson  Procedure(s) Performed: COLONOSCOPY WITH PROPOFOL (N/A )  Patient location during evaluation: Endoscopy Anesthesia Type: General Level of consciousness: awake and alert and oriented Pain management: pain level controlled Vital Signs Assessment: post-procedure vital signs reviewed and stable Respiratory status: spontaneous breathing, nonlabored ventilation and respiratory function stable Cardiovascular status: blood pressure returned to baseline and stable Postop Assessment: no signs of nausea or vomiting Anesthetic complications: no     Last Vitals:  Vitals:   05/02/17 1107 05/02/17 1117  BP: (!) 112/54 113/76  Pulse: 69 67  Resp: 15 17  Temp:    SpO2: 100% 99%    Last Pain:  Vitals:   05/02/17 1117  TempSrc:   PainSc: 0-No pain                 Zahmir Lalla

## 2017-05-02 NOTE — Transfer of Care (Signed)
Immediate Anesthesia Transfer of Care Note  Patient: Shelly Stevenson  Procedure(s) Performed: COLONOSCOPY WITH PROPOFOL (N/A )  Patient Location: PACU and Endoscopy Unit  Anesthesia Type:General  Level of Consciousness: awake and patient cooperative  Airway & Oxygen Therapy: Patient Spontanous Breathing and Patient connected to nasal cannula oxygen  Post-op Assessment: Report given to RN and Post -op Vital signs reviewed and stable  Post vital signs: Reviewed and stable  Last Vitals:  Vitals:   05/02/17 0858 05/02/17 1057  BP: 136/83   Pulse: 72   Resp: 17 (P) 14  Temp: (!) 36.1 C (!) (P) 36.3 C  SpO2: 98%     Last Pain:  Vitals:   05/02/17 0858  TempSrc: Tympanic         Complications: No apparent anesthesia complications

## 2017-05-02 NOTE — Anesthesia Preprocedure Evaluation (Addendum)
Anesthesia Evaluation  Patient identified by MRN, date of birth, ID band Patient awake    Reviewed: Allergy & Precautions, NPO status , Patient's Chart, lab work & pertinent test results  History of Anesthesia Complications Negative for: history of anesthetic complications  Airway Mallampati: II  TM Distance: >3 FB Neck ROM: Full    Dental no notable dental hx.    Pulmonary neg sleep apnea, neg COPD, Current Smoker,    breath sounds clear to auscultation- rhonchi (-) wheezing      Cardiovascular Exercise Tolerance: Good (-) hypertension(-) CAD, (-) Past MI, (-) Cardiac Stents and (-) CABG  Rhythm:Regular Rate:Normal - Systolic murmurs and - Diastolic murmurs    Neuro/Psych PSYCHIATRIC DISORDERS Anxiety Depression negative neurological ROS     GI/Hepatic negative GI ROS, Neg liver ROS,   Endo/Other  negative endocrine ROSneg diabetes  Renal/GU negative Renal ROS     Musculoskeletal negative musculoskeletal ROS (+)   Abdominal (+) - obese,   Peds  Hematology  (+) anemia ,   Anesthesia Other Findings Past Medical History: No date: Anemia     Comment:  with PICA sx's  No date: Anxiety No date: Chicken pox No date: Depression No date: HSV (herpes simplex virus) infection   Reproductive/Obstetrics                             Anesthesia Physical Anesthesia Plan  ASA: II  Anesthesia Plan: General   Post-op Pain Management:    Induction: Intravenous  PONV Risk Score and Plan: 1 and Propofol infusion  Airway Management Planned: Natural Airway  Additional Equipment:   Intra-op Plan:   Post-operative Plan:   Informed Consent: I have reviewed the patients History and Physical, chart, labs and discussed the procedure including the risks, benefits and alternatives for the proposed anesthesia with the patient or authorized representative who has indicated his/her understanding and  acceptance.   Dental advisory given  Plan Discussed with: CRNA and Anesthesiologist  Anesthesia Plan Comments:         Anesthesia Quick Evaluation

## 2017-05-02 NOTE — H&P (Signed)
Melodie BouillonVarnita Demitria Hay, MD [address], [address], [place], [place], [address] [address], [address], [place], [place], [address] Phone: [phone]  Fax: [phone]  Primary Care Physician:  McLean-Scocuzza, Pasty Spillersracy N, MD   Pre-Procedure History & Physical: HPI:  Shelly Stevenson is a 52 y.o. female is here for a colonoscopy.   Past Medical History:  Diagnosis Date  . Anemia    with PICA sx's   . Anxiety   . Chicken pox   . Depression   . HSV (herpes simplex virus) infection     Past Surgical History:  Procedure Laterality Date  . APPENDECTOMY     52 y.o.   . FRACTURE SURGERY     Left thumb    Prior to Admission medications   Medication Sig Start Date End Date Taking? Authorizing Provider  ALPRAZolam Prudy Feeler(XANAX) 0.5 MG tablet Take 1 tablet (0.5 mg total) by mouth daily as needed for anxiety. 04/28/17   McLean-Scocuzza, Pasty Spillersracy N, MD  benzonatate (TESSALON) 200 MG capsule Take 1 capsule (200 mg total) by mouth 3 (three) times daily as needed for cough. 03/19/17   Fisher, Roselyn BeringSusan W, PA-C  clobetasol (TEMOVATE) 0.05 % external solution Apply 1 application topically 2 (two) times daily as needed. Scalp 03/24/17   McLean-Scocuzza, Pasty Spillersracy N, MD  ondansetron (ZOFRAN ODT) 4 MG disintegrating tablet Take 1 tablet (4 mg total) by mouth every 6 (six) hours as needed for nausea or vomiting. 09/28/16   Sharyn CreamerQuale, Mark, MD  triamcinolone cream (KENALOG) 0.1 % Bid elbows avoid face/groin/private 03/24/17   McLean-Scocuzza, Pasty Spillersracy N, MD  venlafaxine XR (EFFEXOR XR) 37.5 MG 24 hr capsule Take 1 capsule (37.5 mg total) by mouth daily with breakfast. 03/24/17   McLean-Scocuzza, Pasty Spillersracy N, MD  zolpidem (AMBIEN) 5 MG tablet Take 1 tablet (5 mg total) by mouth at bedtime as needed for sleep. 03/24/17   McLean-Scocuzza, Pasty Spillersracy N, MD    Allergies as of 04/15/2017 - Review Complete 03/24/2017  Allergen Reaction Noted  . Penicillins Swelling 09/28/2016  . Codeine Rash 03/24/2017    Family History  Problem Relation  Age of Onset  . Arthritis Mother   . Cancer Mother        breast  . Hearing loss Mother   . Hypertension Mother   . Arthritis Father   . Cancer Father        colon dx'ed age 4670s   . Hypertension Father   . Heart disease Father        MI  . Diabetes Father   . Mental illness Son        [facility]    Social History   Socioeconomic History  . Marital status: Divorced    Spouse name: Not on file  . Number of children: Not on file  . Years of education: Not on file  . Highest education level: Not on file  Social Needs  . Financial resource strain: Not on file  . Food insecurity - worry: Not on file  . Food insecurity - inability: Not on file  . Transportation needs - medical: Not on file  . Transportation needs - non-medical: Not on file  Occupational History  . Not on file  Tobacco Use  . Smoking status: Current Every Day Smoker  . Smokeless tobacco: Never Used  . Tobacco comment: <1ppd x 30 years no FH lung cancer   Substance and Sexual Activity  . Alcohol use: Yes    Comment: socially per pt   . Drug use:  No  . Sexual activity: Yes    Comment: men  Other Topics Concern  . 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 Neck:  Supple; no masses or thyromegaly. Lungs:  Clear throughout to auscultation, normal respiratory effort.    Heart:  +S1, +S2, Regular rate and rhythm, No edema. Abdomen:  Soft, nontender and nondistended. Normal bowel sounds, without guarding, and without rebound.   Neurologic:  Alert and  oriented x4;  grossly  normal neurologically.  Impression/Plan: Shelly Stevenson is here for a colonoscopy to be performed for average risk screening.  Risks, benefits, limitations, and alternatives regarding  colonoscopy have been reviewed with the patient.  Questions have been answered.  All parties agreeable.   Pasty Spillers, MD  05/02/2017, 10:24 AM

## 2017-05-02 NOTE — Op Note (Signed)
[facility] Patient Name: Shelly Stevenson Procedure Date: 05/02/2017 10:17 AM MRN: [id number] Account #: [id number] Date of Birth: 11-Jun-1965 Admit Type: Outpatient Age: 52 Room: [facility] ENDO ROOM 1 Gender: Female Note Status: Finalized Procedure:            Colonoscopy Indications:          Screening for colorectal malignant neoplasm Providers:            Treyson Axel B. Maximino Greenland MD, MD Medicines:            Monitored Anesthesia Care Complications:        No immediate complications. Procedure:            Pre-Anesthesia Assessment:                       - ASA Grade Assessment: II - A patient with mild                        systemic disease.                       - Prior to the procedure, a History and Physical was                        performed, and patient medications, allergies and                        sensitivities were reviewed. The patient's tolerance of                        previous anesthesia was reviewed.                       After obtaining informed consent, the colonoscope was                        passed under direct vision. Throughout the procedure,                        the patient's blood pressure, pulse, and oxygen                        saturations were monitored continuously. The                        Colonoscope was introduced through the anus and                        advanced to the the cecum, identified by appendiceal                        orifice and ileocecal valve. The colonoscopy was                        performed with ease. The patient tolerated the                        procedure well. The quality of the bowel preparation                        was fair. A lot of water and suctioning was used  to                        clean the colon. Findings:      The perianal and digital rectal examinations were normal.      The rectum, sigmoid colon, descending colon, transverse colon, ascending       colon and cecum appeared  normal.      The entire examined colon appeared normal.      The retroflexed view of the distal rectum and anal verge was normal and       showed no anal or rectal abnormalities.      A few small-mouthed diverticula were found in the sigmoid colon. Impression:           - Preparation of the colon was fair.                       - The rectum, sigmoid colon, descending colon,                        transverse colon, ascending colon and cecum are normal.                       - The entire examined colon is normal.                       - The distal rectum and anal verge are normal on                        retroflexion view.                       - Diverticulosis in the sigmoid colon.                       - No specimens collected. Recommendation:       - Discharge patient to home (with escort).                       - Advance diet as tolerated.                       - Continue present medications.                       - Repeat colonoscopy in 5 years for screening purposes,                        due to fair prep on this exam (patient had solid food                        the day prior to the procedure).                       - The findings and recommendations were discussed with                        the patient.                       - The findings and recommendations were discussed with  the patient's family.                       - Return to primary care physician as previously                        scheduled.                       - High fiber diet. Procedure Code(s):    --- Professional ---                       Z6109G0121, Colorectal cancer screening; colonoscopy on                        individual not meeting criteria for high risk Diagnosis Code(s):    --- Professional ---                       Z12.11, Encounter for screening for malignant neoplasm                        of colon CPT copyright 2016 American Medical Association. All rights reserved. The  codes documented in this report are preliminary and upon coder review may  be revised to meet current compliance requirements.  Melodie BouillonVarnita Champion Corales, MD Michel BickersVarnita B. Maximino Greenlandahiliani MD, MD 05/02/2017 10:55:20 AM This report has been signed electronically. Number of Addenda: 0 Note Initiated On: 05/02/2017 10:17 AM Scope Withdrawal Time: 0 hours 16 minutes 54 seconds  Total Procedure Duration: 0 hours 23 minutes 52 seconds  Estimated Blood Loss: Estimated blood loss: none.      [facility]

## 2017-05-05 ENCOUNTER — Encounter: Payer: Self-pay | Admitting: Gastroenterology

## 2017-05-05 ENCOUNTER — Telehealth: Payer: Self-pay | Admitting: Internal Medicine

## 2017-05-05 NOTE — Telephone Encounter (Signed)
Patient stated at last OV she discussed with PCP weight loss concerns and would like to know if PCP will prescribe Phentermine? Patient has signed DPR can leave message on voicemail.

## 2017-05-06 NOTE — Telephone Encounter (Signed)
Please advise 

## 2017-05-07 ENCOUNTER — Other Ambulatory Visit: Payer: Self-pay | Admitting: Internal Medicine

## 2017-05-07 DIAGNOSIS — B9689 Other specified bacterial agents as the cause of diseases classified elsewhere: Secondary | ICD-10-CM

## 2017-05-07 DIAGNOSIS — E669 Obesity, unspecified: Secondary | ICD-10-CM

## 2017-05-07 DIAGNOSIS — N760 Acute vaginitis: Secondary | ICD-10-CM

## 2017-05-07 MED ORDER — METRONIDAZOLE 0.75 % VA GEL: 1.0000 | Freq: Two times a day (BID) | VAGINAL | 0 refills | Status: DC

## 2017-05-07 MED ORDER — PHENTERMINE HCL 37.5 MG PO TABS: 37.5000 mg | ORAL_TABLET | Freq: Every day | ORAL | 0 refills | Status: DC

## 2017-05-07 NOTE — Telephone Encounter (Signed)
Will Rx adipex please sch f/u in 1 month

## 2017-05-07 NOTE — Telephone Encounter (Signed)
Patient schedule appointment in one month.

## 2017-05-13 ENCOUNTER — Telehealth: Payer: Self-pay | Admitting: Internal Medicine

## 2017-05-13 ENCOUNTER — Other Ambulatory Visit: Payer: BLUE CROSS/BLUE SHIELD

## 2017-05-13 NOTE — Telephone Encounter (Signed)
Sent labs via efax

## 2017-05-13 NOTE — Telephone Encounter (Signed)
Copied from CRM [phone]. Topic: Quick Communication - Lab Results >> May 13, 2017 11:23 AM Darletta MollLander, Lumin L wrote: Patient called to inform Dr. Shirlee LatchMclean that she would like her  04/29/2017 lipid lab results faxed to [facility] at her work. Fax: [phone]  ATTN: Babette Relicammy

## 2017-05-14 ENCOUNTER — Other Ambulatory Visit: Payer: Self-pay | Admitting: Internal Medicine

## 2017-05-14 DIAGNOSIS — F419 Anxiety disorder, unspecified: Secondary | ICD-10-CM

## 2017-05-14 DIAGNOSIS — G4700 Insomnia, unspecified: Secondary | ICD-10-CM

## 2017-05-14 DIAGNOSIS — F32A Depression, unspecified: Secondary | ICD-10-CM

## 2017-05-14 DIAGNOSIS — F329 Major depressive disorder, single episode, unspecified: Secondary | ICD-10-CM

## 2017-06-09 ENCOUNTER — Ambulatory Visit: Payer: BLUE CROSS/BLUE SHIELD | Admitting: Internal Medicine

## 2017-06-13 ENCOUNTER — Ambulatory Visit (INDEPENDENT_AMBULATORY_CARE_PROVIDER_SITE_OTHER): Payer: BLUE CROSS/BLUE SHIELD | Admitting: Internal Medicine

## 2017-06-13 ENCOUNTER — Encounter: Payer: Self-pay | Admitting: Internal Medicine

## 2017-06-13 VITALS — BP 122/82 | HR 74 | Temp 98.1°F | Resp 16 | Ht 62.5 in | Wt 172.0 lb

## 2017-06-13 DIAGNOSIS — N760 Acute vaginitis: Secondary | ICD-10-CM | POA: Diagnosis not present

## 2017-06-13 DIAGNOSIS — Z1283 Encounter for screening for malignant neoplasm of skin: Secondary | ICD-10-CM

## 2017-06-13 DIAGNOSIS — G4700 Insomnia, unspecified: Secondary | ICD-10-CM | POA: Diagnosis not present

## 2017-06-13 DIAGNOSIS — F339 Major depressive disorder, recurrent, unspecified: Secondary | ICD-10-CM

## 2017-06-13 DIAGNOSIS — E785 Hyperlipidemia, unspecified: Secondary | ICD-10-CM | POA: Diagnosis not present

## 2017-06-13 DIAGNOSIS — Z780 Asymptomatic menopausal state: Secondary | ICD-10-CM

## 2017-06-13 DIAGNOSIS — F419 Anxiety disorder, unspecified: Secondary | ICD-10-CM | POA: Diagnosis not present

## 2017-06-13 DIAGNOSIS — B9689 Other specified bacterial agents as the cause of diseases classified elsewhere: Secondary | ICD-10-CM

## 2017-06-13 DIAGNOSIS — E669 Obesity, unspecified: Secondary | ICD-10-CM | POA: Diagnosis not present

## 2017-06-13 DIAGNOSIS — R21 Rash and other nonspecific skin eruption: Secondary | ICD-10-CM

## 2017-06-13 DIAGNOSIS — Z23 Encounter for immunization: Secondary | ICD-10-CM

## 2017-06-13 MED ORDER — METRONIDAZOLE 500 MG PO TABS: 500.0000 mg | ORAL_TABLET | Freq: Two times a day (BID) | ORAL | 0 refills | Status: DC

## 2017-06-13 MED ORDER — PHENTERMINE HCL 37.5 MG PO TABS: 37.5000 mg | ORAL_TABLET | Freq: Every day | ORAL | 0 refills | Status: DC

## 2017-06-13 MED ORDER — ZOLPIDEM TARTRATE 5 MG PO TABS: 5.0000 mg | ORAL_TABLET | Freq: Every evening | ORAL | 1 refills | Status: DC | PRN

## 2017-06-13 MED ORDER — VENLAFAXINE HCL ER 75 MG PO CP24: 75.0000 mg | ORAL_CAPSULE | Freq: Every day | ORAL | Status: DC

## 2017-06-13 NOTE — Patient Instructions (Addendum)
Refer to dermatology Dr. Lucy AntiguaArin Isenstein [address] [phone]  F/u in 1 month   Cholesterol Cholesterol is a white, waxy, fat-like substance that is needed by the human body in small amounts. The liver makes all the cholesterol we need. Cholesterol is carried from the liver by the blood through the blood vessels. Deposits of cholesterol (plaques) may build up on blood vessel (artery) walls. Plaques make the arteries narrower and stiffer. Cholesterol plaques increase the risk for heart attack and stroke. You cannot feel your cholesterol level even if it is very high. The only way to know that it is high is to have a blood test. Once you know your cholesterol levels, you should keep a record of the test results. Work with your health care provider to keep your levels in the desired range. What do the results mean?  Total cholesterol is a rough measure of all the cholesterol in your blood.  LDL (low-density lipoprotein) is the "bad" cholesterol. This is the type that causes plaque to build up on the artery walls. You want this level to be low.  HDL (high-density lipoprotein) is the "good" cholesterol because it cleans the arteries and carries the LDL away. You want this level to be high.  Triglycerides are fat that the body can either burn for energy or store. High levels are closely linked to heart disease. What are the desired levels of cholesterol?  Total cholesterol below 200.  LDL below 100 for people who are at risk, below 70 for people at very high risk.  HDL above 40 is good. A level of 60 or higher is considered to be protective against heart disease.  Triglycerides below 150. How can I lower my cholesterol? Diet Follow your diet program as told by your health care provider.  Choose fish or white meat chicken and [place]key, roasted or baked. Limit fatty cuts of red meat, fried foods, and processed meats, such as sausage and lunch meats.  Eat lots of fresh fruits and  vegetables.  Choose whole grains, beans, pasta, potatoes, and cereals.  Choose olive oil, corn oil, or canola oil, and use only small amounts.  Avoid butter, mayonnaise, shortening, or palm kernel oils.  Avoid foods with trans fats.  Drink skim or nonfat milk and eat low-fat or nonfat yogurt and cheeses. Avoid whole milk, cream, ice cream, egg yolks, and full-fat cheeses.  Healthier desserts include angel food cake, ginger snaps, animal crackers, hard candy, popsicles, and low-fat or nonfat frozen yogurt. Avoid pastries, cakes, pies, and cookies.  Exercise  Follow your exercise program as told by your health care provider. A regular program: ? Helps to decrease LDL and raise HDL. ? Helps with weight control.  Do things that increase your activity level, such as gardening, walking, and taking the stairs.  Ask your health care provider about ways that you can be more active in your daily life.  Medicine  Take over-the-counter and prescription medicines only as told by your health care provider. ? Medicine may be prescribed by your health care provider to help lower cholesterol and decrease the risk for heart disease. This is usually done if diet and exercise have failed to bring down cholesterol levels. ? If you have several risk factors, you may need medicine even if your levels are normal.  This information is not intended to replace advice given to you by your health care provider. Make sure you discuss any questions you have with your health care provider. Document  Released: 12/11/2000 Document Revised: 10/14/2015 Document Reviewed: 09/16/2015 Elsevier Interactive Patient Education  2018 [employer].    Bacterial Vaginosis Bacterial vaginosis is a vaginal infection that occurs when the normal balance of bacteria in the vagina is disrupted. It results from an overgrowth of certain bacteria. This is the most common vaginal infection among women ages 107-44. Because bacterial  vaginosis increases your risk for STIs (sexually transmitted infections), getting treated can help reduce your risk for chlamydia, gonorrhea, herpes, and HIV (human immunodeficiency virus). Treatment is also important for preventing complications in pregnant women, because this condition can cause an early (premature) delivery. What are the causes? This condition is caused by an increase in harmful bacteria that are normally present in small amounts in the vagina. However, the reason that the condition develops is not fully understood. What increases the risk? The following factors may make you more likely to develop this condition:  Having a new sexual partner or multiple sexual partners.  Having unprotected sex.  Douching.  Having an intrauterine device (IUD).  Smoking.  Drug and alcohol abuse.  Taking certain antibiotic medicines.  Being pregnant.  You cannot get bacterial vaginosis from toilet seats, bedding, swimming pools, or contact with objects around you. What are the signs or symptoms? Symptoms of this condition include:  Grey or white vaginal discharge. The discharge can also be watery or foamy.  A fish-like odor with discharge, especially after sexual intercourse or during menstruation.  Itching in and around the vagina.  Burning or pain with urination.  Some women with bacterial vaginosis have no signs or symptoms. How is this diagnosed? This condition is diagnosed based on:  Your medical history.  A physical exam of the vagina.  Testing a sample of vaginal fluid under a microscope to look for a large amount of bad bacteria or abnormal cells. Your health care provider may use a cotton swab or a small wooden spatula to collect the sample.  How is this treated? This condition is treated with antibiotics. These may be given as a pill, a vaginal cream, or a medicine that is put into the vagina (suppository). If the condition comes back after treatment, a second  round of antibiotics may be needed. Follow these instructions at home: Medicines  Take over-the-counter and prescription medicines only as told by your health care provider.  Take or use your antibiotic as told by your health care provider. Do not stop taking or using the antibiotic even if you start to feel better. General instructions  If you have a female sexual partner, tell her that you have a vaginal infection. She should see her health care provider and be treated if she has symptoms. If you have a female sexual partner, he does not need treatment.  During treatment: ? Avoid sexual activity until you finish treatment. ? Do not douche. ? Avoid alcohol as directed by your health care provider. ? Avoid breastfeeding as directed by your health care provider.  Drink enough water and fluids to keep your urine clear or pale yellow.  Keep the area around your vagina and rectum clean. ? Wash the area daily with warm water. ? Wipe yourself from front to back after using the toilet.  Keep all follow-up visits as told by your health care provider. This is important. How is this prevented?  Do not douche.  Wash the outside of your vagina with warm water only.  Use protection when having sex. This includes latex condoms and dental dams.  Limit how many sexual partners you have. To help prevent bacterial vaginosis, it is best to have sex with just one partner (monogamous).  Make sure you and your sexual partner are tested for STIs.  Wear cotton or cotton-lined underwear.  Avoid wearing tight pants and pantyhose, especially during summer.  Limit the amount of alcohol that you drink.  Do not use any products that contain nicotine or tobacco, such as cigarettes and e-cigarettes. If you need help quitting, ask your health care provider.  Do not use illegal drugs. Where to find more information:  Centers for Disease Control and Prevention: SolutionApps.co.za  American Sexual Health  Association (ASHA): www.ashastd.org  U.S. Department of Health and Health and safety inspector, Office on Women's Health: ConventionalMedicines.si or http://www.anderson-williamson.info/ Contact a health care provider if:  Your symptoms do not improve, even after treatment.  You have more discharge or pain when urinating.  You have a fever.  You have pain in your abdomen.  You have pain during sex.  You have vaginal bleeding between periods. Summary  Bacterial vaginosis is a vaginal infection that occurs when the normal balance of bacteria in the vagina is disrupted.  Because bacterial vaginosis increases your risk for STIs (sexually transmitted infections), getting treated can help reduce your risk for chlamydia, gonorrhea, herpes, and HIV (human immunodeficiency virus). Treatment is also important for preventing complications in pregnant women, because the condition can cause an early (premature) delivery.  This condition is treated with antibiotic medicines. These may be given as a pill, a vaginal cream, or a medicine that is put into the vagina (suppository). This information is not intended to replace advice given to you by your health care provider. Make sure you discuss any questions you have with your health care provider. Document Released: 03/18/2005 Document Revised: 07/22/2016 Document Reviewed: 12/02/2015 Elsevier Interactive Patient Education  [employer].

## 2017-06-13 NOTE — Progress Notes (Signed)
Chief Complaint  Patient presents with  . Follow-up   F/u  1. Lost 3 lbs on Adipex and wants refill  2. She wants #2 of hepatitis B vacine  3. Anxiety better she saw Dr. Kateri Plummer at [facility] and has another appt 07/03/17 4. C/o rash to b/l lower legs and no sx's and she notices when she gets hot 5. BV has not tried topical metrogel vaginally due to cost and want to repeat the pills  6. Disc hot flashes and her fried uses Divigel to shoulder. Reviewed side effects of HRT and pt declines to use this for hot flashes effexor just increased to 75 mg qd disc with pt this may help   Review of Systems  Constitutional: Positive for weight loss.  HENT: Negative for hearing loss.   Eyes: Negative for blurred vision.  Respiratory: Negative for shortness of breath.   Cardiovascular: Negative for chest pain.  Gastrointestinal: Negative for abdominal pain.  Genitourinary:       Vaginal discharge   Skin: Positive for rash.  Psychiatric/Behavioral: The patient is nervous/anxious.    Past Medical History:  Diagnosis Date  . Anemia    with PICA sx's   . Anxiety   . Bacterial vaginosis   . Chicken pox   . Depression   . HSV (herpes simplex virus) infection    Past Surgical History:  Procedure Laterality Date  . APPENDECTOMY     52 y.o.   . COLONOSCOPY WITH PROPOFOL N/A 05/02/2017   Procedure: COLONOSCOPY WITH PROPOFOL;  Surgeon: Pasty Spillers, MD;  Location: [facility] ENDOSCOPY;  Service: Endoscopy;  Laterality: N/A;  . FRACTURE SURGERY     Left thumb   Family History  Problem Relation Age of Onset  . Arthritis Mother   . Cancer Mother        breast  . Hearing loss Mother   . Hypertension Mother   . Arthritis Father   . Cancer Father        colon dx'ed age 68s   . Hypertension Father   . Heart disease Father        MI  . Diabetes Father   . Mental illness Son        [facility]   Social History   Socioeconomic History  . Marital status: Divorced    Spouse name: Not on  file  . Number of children: Not on file  . Years of education: Not on file  . Highest education level: Not on file  Social Needs  . Financial resource strain: Not on file  . Food insecurity - worry: Not on file  . Food insecurity - inability: Not on file  . Transportation needs - medical: Not on file  . Transportation needs - non-medical: Not on file  Occupational History  . Not on file  Tobacco Use  . Smoking status: Current Every Day Smoker  . Smokeless tobacco: Never Used  . Tobacco comment: <1ppd x 30 years no FH lung cancer   Substance and Sexual Activity  . Alcohol use: Yes    Comment: socially per pt   . Drug use: No  . Sexual activity: Yes    Comment: men  Other Topics Concern  . Not on file  Social History Narrative   Works in [employer]    12 grade education    Enjoys yard work    1 dog    3 kids 2 boys and 1 girl and 1 grandson [employer]  Current Meds  Medication Sig  . ALPRAZolam (XANAX) 0.5 MG tablet Take 1 tablet (0.5 mg total) by mouth daily as needed for anxiety.  . clobetasol (TEMOVATE) 0.05 % external solution Apply 1 application topically 2 (two) times daily as needed. Scalp  . phentermine (ADIPEX-P) 37.5 MG tablet Take 1 tablet (37.5 mg total) by mouth daily before breakfast.  . triamcinolone cream (KENALOG) 0.1 % Bid elbows avoid face/groin/private  . venlafaxine XR (EFFEXOR XR) 37.5 MG 24 hr capsule Take 1 capsule (37.5 mg total) by mouth daily with breakfast.  . zolpidem (AMBIEN) 5 MG tablet Take 1 tablet (5 mg total) by mouth at bedtime as needed for sleep.  . [DISCONTINUED] benzonatate (TESSALON) 200 MG capsule Take 1 capsule (200 mg total) by mouth 3 (three) times daily as needed for cough.  . [DISCONTINUED] ondansetron (ZOFRAN ODT) 4 MG disintegrating tablet Take 1 tablet (4 mg total) by mouth every 6 (six) hours as needed for nausea or vomiting.  . [DISCONTINUED] phentermine (ADIPEX-P) 37.5 MG tablet Take 1 tablet (37.5 mg total) by  mouth daily before breakfast. (Patient taking differently: Take 75 mg by mouth daily before breakfast. )  . [DISCONTINUED] zolpidem (AMBIEN) 5 MG tablet Take 1 tablet (5 mg total) by mouth at bedtime as needed for sleep.   Allergies  Allergen Reactions  . Penicillins Swelling    Angioedema  . Codeine Rash    Elevated heartrate   Recent Results (from the past 2160 hour(s))  Urine cytology ancillary only     Status: None   Collection Time: 03/24/17 12:00 AM  Result Value Ref Range   Chlamydia Negative     Comment: Normal Reference Range - Negative   Neisseria gonorrhea Negative     Comment: Normal Reference Range - Negative   Trichomonas Negative     Comment: Normal Reference Range - Negative  Urine cytology ancillary only     Status: Abnormal   Collection Time: 03/24/17 12:00 AM  Result Value Ref Range   Bacterial vaginitis (A)     **POSITIVE for Atopobium vaginae, POSITIVE for Megasphaera 1, POSITIVE for Gardnerella vaginalis, POSITIVE for BVAB2**    Comment: Normal Reference Range - Negative   Candida vaginitis Negative for Candida Vaginitis Microorganisms     Comment: Normal Reference Range - Negative  Urinalysis, Routine w reflex microscopic     Status: Abnormal   Collection Time: 03/24/17 12:13 PM  Result Value Ref Range   Color, Urine YELLOW YELLOW   APPearance CLEAR CLEAR   Specific Gravity, Urine 1.015 1.001 - 1.03   pH 7.0 5.0 - 8.0   Glucose, UA NEGATIVE NEGATIVE   Bilirubin Urine NEGATIVE NEGATIVE   Ketones, ur NEGATIVE NEGATIVE   Hgb urine dipstick TRACE (A) NEGATIVE   Protein, ur NEGATIVE NEGATIVE   Nitrite NEGATIVE NEGATIVE   Leukocytes, UA NEGATIVE NEGATIVE   WBC, UA NONE SEEN 0 - 5 /HPF   RBC / HPF 0-2 0 - 2 /HPF   Squamous Epithelial / LPF NONE SEEN < OR = 5 /HPF   Bacteria, UA NONE SEEN NONE SEEN /HPF   Hyaline Cast NONE SEEN NONE SEEN /LPF  TSH     Status: None   Collection Time: 03/24/17 12:14 PM  Result Value Ref Range   TSH 3.04 mIU/L     Comment:           Reference Range .           > or = 20 Years  0.40-4.50 .  Pregnancy Ranges           First trimester    0.26-2.66           Second trimester   0.55-2.73           Third trimester    0.43-2.91   T4, free     Status: None   Collection Time: 03/24/17 12:14 PM  Result Value Ref Range   Free T4 1.0 0.8 - 1.8 ng/dL  Antinuclear Antib (ANA)     Status: Abnormal   Collection Time: 03/24/17 12:14 PM  Result Value Ref Range   Anit Nuclear Antibody(ANA) POSITIVE (A) NEGATIVE    Comment: ANA IFA is a first line screen for detecting the presence of up to approximately 150 autoantibodies in various autoimmune diseases. A positive ANA IFA result is suggestive of autoimmune disease and reflexes to titer and pattern. Further laboratory testing may be considered if clinically indicated. . Visit Physician FAQs for interpretation of all antibodies in the [place], prevalence, and association with diseases at http://education.[facility].com/ [email or web address] .   Sedimentation rate     Status: None   Collection Time: 03/24/17 12:14 PM  Result Value Ref Range   Sed Rate 11 0 - 30 mm/h  C-reactive protein     Status: None   Collection Time: 03/24/17 12:14 PM  Result Value Ref Range   CRP 2.5 <8.0 mg/L  Cyclic citrul peptide antibody, IgG     Status: None   Collection Time: 03/24/17 12:14 PM  Result Value Ref Range   Cyclic Citrullin Peptide Ab <87 UNITS    Comment: Reference Range Negative:            <20 Weak Positive:       20-39 Moderate Positive:   40-59 Strong Positive:     >59 .   Rheumatoid Factor     Status: None   Collection Time: 03/24/17 12:14 PM  Result Value Ref Range   Rhuematoid fact SerPl-aCnc <14 <14 IU/mL  FSH     Status: None   Collection Time: 03/24/17 12:14 PM  Result Value Ref Range   FSH 86.4 mIU/mL    Comment:                     Reference Range .              Follicular Phase       2.5-10.2              Mid-cycle Peak          3.1-17.7              Luteal Phase           1.5- 9.1              Postmenopausal       23.0-116.3              .   Iron, TIBC and Ferritin Panel     Status: None   Collection Time: 03/24/17 12:14 PM  Result Value Ref Range   Iron 89 45 - 160 mcg/dL   TIBC 564 332 - 951 mcg/dL (calc)   %SAT 22 11 - 50 % (calc)   Ferritin 32 10 - 232 ng/mL  RPR     Status: None   Collection Time: 03/24/17 12:14 PM  Result Value Ref Range   RPR Ser Ql NON-REACTIVE NON-REACTI  HIV antibody     Status: None  Collection Time: 03/24/17 12:14 PM  Result Value Ref Range   HIV 1&2 Ab, 4th Generation NON-REACTIVE NON-REACTI    Comment: HIV-1 antigen and HIV-1/HIV-2 antibodies were not detected. There is no laboratory evidence of HIV infection. Marland Kitchen PLEASE NOTE: This information has been disclosed to you from records whose confidentiality may be protected by state law.  If your state requires such protection, then the state law prohibits you from making any further disclosure of the information without the specific written consent of the person to whom it pertains, or as otherwise permitted by law. A general authorization for the release of medical or other information is NOT sufficient for this purpose. . For additional information please refer to http://education.[facility].com/faq/FAQ106 (This link is being provided for informational/ educational purposes only.) . Marland Kitchen The performance of this assay has not been clinically validated in patients less than 10 years old. .   Hepatitis B surface antibody     Status: Abnormal   Collection Time: 03/24/17 12:14 PM  Result Value Ref Range   Hepatitis B-Post <5 (L) > OR = 10 mIU/mL    Comment: . Patient does not have immunity to hepatitis B virus. . For additional information, please refer to http://education.[facility].com/faq/FAQ105 (This link is being provided for informational/ educational purposes only).   Hepatitis B core antibody,  total     Status: None   Collection Time: 03/24/17 12:14 PM  Result Value Ref Range   Hep B Core Total Ab NON-REACTIVE NON-REACTI  Hepatitis C antibody     Status: None   Collection Time: 03/24/17 12:14 PM  Result Value Ref Range   Hepatitis C Ab NON-REACTIVE NON-REACTI   SIGNAL TO CUT-OFF 0.05 <1.00  Hepatitis B surface antigen     Status: None   Collection Time: 03/24/17 12:14 PM  Result Value Ref Range   Hepatitis B Surface Ag NON-REACTIVE NON-REACTI  HSV 2 antibody, IgG     Status: Abnormal   Collection Time: 03/24/17 12:14 PM  Result Value Ref Range   HSV 2 Glycoprotein G Ab, IgG 23.00 (H) index    Comment:                           Index          Interpretation                           -----          --------------                           <0.90          Negative                           0.90-1.09      Equivocal                           >1.09          Positive . This assay utilizes recombinant type-specific antigens to differentiate HSV-1 from HSV-2 infections. A positive result cannot distinguish between recent and past infection. If recent HSV infection is suspected but the results are negative or equivocal, the assay should be repeated in 4-6 weeks. The performance characteristics of the assay have not been established for pediatric populations, immunocompromised patients,  or neonatal screening.   HSV 1/2 Ab (IgM), IFA w/rflx Titer     Status: None   Collection Time: 03/24/17 12:14 PM  Result Value Ref Range   HSV 1 IgM Screen Negative Negative   HSV 2 IgM Screen Negative Negative    Comment: . The IFA procedure for measuring IgM antibodies to HSV 1 and HSV 2 detects both type-common and type-specific HSV antibodies. Thus, IgM reactivity to both HSV 1 and HSV 2 may represent crossreactive HSV antibodies rather than exposure to both HSV 1 and HSV 2. . This test was developed and its analytical performance characteristics have been determined by [employer], [place], [place]. It has not been cleared or approved by the FDA. This assay has been validated pursuant to the CLIA regulations and is used for clinical purposes. Marland Kitchen   HSV 1 antibody, IgG     Status: None   Collection Time: 03/24/17 12:14 PM  Result Value Ref Range   HSV 1 Glycoprotein G Ab, IgG <0.90 index    Comment:                           Index          Interpretation                           -----          --------------                           <0.90          Negative                           0.90-1.09      Equivocal                           >1.09          Positive . This assay utilizes recombinant type-specific antigens to differentiate HSV-1 from HSV-2 infections. A positive result cannot distinguish between recent and past infection. If recent HSV infection is suspected but the results are negative or equivocal, the assay should be repeated in 4-6 weeks. The performance characteristics of the assay have not been established for pediatric populations, immunocompromised patients, or neonatal screening.   TEST AUTHORIZATION     Status: None   Collection Time: 03/24/17 12:14 PM  Result Value Ref Range   TEST NAME: HSV 1 IGG, TYPE SPECIFIC HSV     TEST CODE: [id number]    CLIENT CONTACT: LATOYA WRIGHT    REPORT ALWAYS MESSAGE SIGNATURE      Comment: . The laboratory testing on this patient was verbally requested or confirmed by the ordering physician or his or her authorized representative after contact with an employee of [employer]. Federal regulations require that we maintain on file written authorization for all laboratory testing.  Accordingly we are asking that the ordering physician or his or her authorized representative sign a copy of this report and promptly return it to the client service representative. . . Signature:____________________________________________________ . Please fax this signed page to  [phone] or return it via your [employer] courier.   Anti-nuclear ab-titer (ANA titer)     Status: Abnormal   Collection Time: 03/24/17 12:14 PM  Result  Value Ref Range   ANA Pattern 1 NUCLEOLAR (A)     Comment: Nucleolar pattern is associated with systemic sclerosis (scleroderma), systemic sclerosis/ polymyositis overlap and Sjogren's syndrome.    ANA Titer 1 1:80 (H) titer    Comment: A low level ANA titer may be present in pre-clinical autoimmune diseases and normal individuals.                 Reference Range                 <1:40        Negative                 1:40-1:80    Low Antibody Level                 >1:80        Elevated Antibody Level .   Comprehensive metabolic panel     Status: Abnormal   Collection Time: 03/24/17 12:16 PM  Result Value Ref Range   Glucose, Bld 100 (H) 65 - 99 mg/dL    Comment: .            Fasting reference interval . For someone without known diabetes, a glucose value between 100 and 125 mg/dL is consistent with prediabetes and should be confirmed with a follow-up test. .    BUN 15 7 - 25 mg/dL   Creat 1.61 0.96 - 0.45 mg/dL    Comment: For patients >23 years of age, the reference limit for Creatinine is approximately 13% higher for people identified as African-American. .    BUN/Creatinine Ratio NOT APPLICABLE 6 - 22 (calc)   Sodium 135 135 - 146 mmol/L   Potassium 4.4 3.5 - 5.3 mmol/L   Chloride 100 98 - 110 mmol/L   CO2 29 20 - 32 mmol/L   Calcium 9.7 8.6 - 10.4 mg/dL   Total Protein 7.9 6.1 - 8.1 g/dL   Albumin 4.9 3.6 - 5.1 g/dL   Globulin 3.0 1.9 - 3.7 g/dL (calc)   AG Ratio 1.6 1.0 - 2.5 (calc)   Total Bilirubin 0.3 0.2 - 1.2 mg/dL   Alkaline phosphatase (APISO) 85 33 - 130 U/L   AST 18 10 - 35 U/L   ALT 14 6 - 29 U/L  CBC with Differential/Platelet     Status: None   Collection Time: 03/24/17 12:16 PM  Result Value Ref Range   WBC 6.9 3.8 - 10.8 Thousand/uL   RBC 4.40 3.80 - 5.10 Million/uL   Hemoglobin  14.0 11.7 - 15.5 g/dL   HCT 40.9 81.1 - 91.4 %   MCV 93.2 80.0 - 100.0 fL   MCH 31.8 27.0 - 33.0 pg   MCHC 34.1 32.0 - 36.0 g/dL   RDW 78.2 95.6 - 21.3 %   Platelets 284 140 - 400 Thousand/uL   MPV 8.7 7.5 - 12.5 fL   Neutro Abs 4,306 1,500 - 7,800 cells/uL   Lymphs Abs 1,787 850 - 3,900 cells/uL   WBC mixed population 538 200 - 950 cells/uL   Eosinophils Absolute 207 15 - 500 cells/uL   Basophils Absolute 62 0 - 200 cells/uL   Neutrophils Relative % 62.4 %   Total Lymphocyte 25.9 %   Monocytes Relative 7.8 %   Eosinophils Relative 3.0 %   Basophils Relative 0.9 %  Cytology - PAP     Status: Abnormal   Collection Time: 04/28/17 12:00 AM  Result Value Ref Range   Adequacy  Satisfactory for evaluation  endocervical/transformation zone component PRESENT.   Diagnosis      -  NEGATIVE FOR INTRAEPITHELIAL LESIONS OR MALIGNANCY   Diagnosis -  BENIGN REACTIVE/REPARATIVE CHANGES    Bacterial vaginitis POSITIVE for Gardnerella vaginalis (A)     Comment: Normal Reference Range - Negative   Candida vaginitis Negative for Candida species     Comment: Normal Reference Range - Negative   HPV NOT DETECTED     Comment: Normal Reference Range - NOT Detected   Material Submitted CervicoVaginal Pap [ThinPrep Imaged]   Lipid panel     Status: Abnormal   Collection Time: 04/29/17 10:22 AM  Result Value Ref Range   Cholesterol 204 (H) 0 - 200 mg/dL    Comment: ATP III Classification       Desirable:  < 200 mg/dL               Borderline High:  200 - 239 mg/dL          High:  > = 409240 mg/dL   Triglycerides 811.9112.0 0.0 - 149.0 mg/dL    Comment: Normal:  <147<150 mg/dLBorderline High:  150 - 199 mg/dL   HDL 82.9575.20 >62.13>39.00 mg/dL   VLDL 08.622.4 0.0 - 57.840.0 mg/dL   LDL Cholesterol 469106 (H) 0 - 99 mg/dL   Total CHOL/HDL Ratio 3     Comment:                Men          Women1/2 Average Risk     3.4          3.3Average Risk          5.0          4.42X Average Risk          9.6          7.13X Average Risk           15.0          11.0                       NonHDL 128.84     Comment: NOTE:  Non-HDL goal should be 30 mg/dL higher than patient's LDL goal (i.e. LDL goal of < 70 mg/dL, would have non-HDL goal of < 100 mg/dL)  Pregnancy, urine POC     Status: None   Collection Time: 05/02/17  9:05 AM  Result Value Ref Range   Preg Test, Ur NEGATIVE NEGATIVE    Comment:        THE SENSITIVITY OF THIS METHODOLOGY IS >24 mIU/mL    Objective  Body mass index is 30.96 kg/m. Wt Readings from Last 3 Encounters:  06/13/17 172 lb (78 kg)  05/02/17 175 lb (79.4 kg)  04/28/17 175 lb 4 oz (79.5 kg)   Temp Readings from Last 3 Encounters:  06/13/17 98.1 F (36.7 C) (Oral)  05/02/17 (!) 97.3 F (36.3 C)  04/28/17 98.4 F (36.9 C) (Oral)   BP Readings from Last 3 Encounters:  06/13/17 122/82  05/02/17 113/76  04/28/17 136/90   Pulse Readings from Last 3 Encounters:  06/13/17 74  05/02/17 67  04/28/17 76   O2 Sat room air 97%  Physical Exam  Constitutional: She is oriented to person, place, and time and well-developed, well-nourished, and in no distress. Vital signs are normal.  HENT:  Head: Normocephalic and atraumatic.  Mouth/Throat: Oropharynx is clear and moist and mucous membranes are  normal.  Eyes: Conjunctivae are normal. Pupils are equal, round, and reactive to light.  Cardiovascular: Normal rate, regular rhythm and normal heart sounds.  Pulmonary/Chest: Effort normal and breath sounds normal.  Neurological: She is alert and oriented to person, place, and time. Gait normal. Gait normal.  Skin: Skin is warm and dry. Rash noted.     Psychiatric: Mood, memory, affect and judgment normal.  Nursing note and vitals reviewed.   Assessment   1. Obesity and HLD  2. Anxiety/insomnia 3. Rash b/l lower legs ? Heat vs vasculitis vs dermatitis vs ? H/o psoriasis. Needs tbse  4. Bacterial vaginosis  5. Hot flashes  6. HM Plan  1. Refilled Adipiex see in 1 month  2. Rx refill Ambien 5 mg qhs  prn on Xanax 0.5 as well  On effexor 75 mg qd  F/u with CBC Dr. Kateri Plummer as well appt 07/03/17  3.  Referred to Dr. Roseanne Kaufman for appt and also tbse  Unclear etiology of rash on legs  4. Flagyl bid x 1 week topical too expensive  5. Disc side effects HRT pt does not want to try See how higher dose of Effexor work  6.  Had flu and Tdap  Given hep B vaccine 2/3 today   Pap today 04/28/17 neg neg hpv +BV (see above) mammo sch 06/24/17  Colonoscopy had 05/02/17 normal diverticulosis repeat in 5 years  rec smoking cessation  Refer to dermatology today Dr. Roseanne Kaufman    Provider: Dr. French Ana McLean-Scocuzza-[facility]

## 2017-06-24 ENCOUNTER — Ambulatory Visit
Admission: RE | Admit: 2017-06-24 | Discharge: 2017-06-24 | Disposition: A | Discharge status: Home / Self Care | Payer: BLUE CROSS/BLUE SHIELD | Source: Ambulatory Visit | Attending: Internal Medicine | Admitting: Internal Medicine

## 2017-06-24 DIAGNOSIS — Z1231 Encounter for screening mammogram for malignant neoplasm of breast: Secondary | ICD-10-CM | POA: Insufficient documentation

## 2017-06-25 ENCOUNTER — Other Ambulatory Visit: Payer: Self-pay | Admitting: Internal Medicine

## 2017-06-25 DIAGNOSIS — N6489 Other specified disorders of breast: Secondary | ICD-10-CM

## 2017-06-25 DIAGNOSIS — R928 Other abnormal and inconclusive findings on diagnostic imaging of breast: Secondary | ICD-10-CM

## 2017-07-02 ENCOUNTER — Ambulatory Visit: Payer: BLUE CROSS/BLUE SHIELD

## 2017-07-08 ENCOUNTER — Other Ambulatory Visit: Payer: Self-pay | Admitting: Internal Medicine

## 2017-07-08 ENCOUNTER — Ambulatory Visit
Admission: RE | Admit: 2017-07-08 | Discharge: 2017-07-08 | Disposition: A | Discharge status: Home / Self Care | Payer: BLUE CROSS/BLUE SHIELD | Source: Ambulatory Visit | Attending: Internal Medicine | Admitting: Internal Medicine

## 2017-07-08 DIAGNOSIS — R928 Other abnormal and inconclusive findings on diagnostic imaging of breast: Secondary | ICD-10-CM

## 2017-07-08 DIAGNOSIS — N6489 Other specified disorders of breast: Secondary | ICD-10-CM

## 2017-07-21 ENCOUNTER — Ambulatory Visit: Payer: BLUE CROSS/BLUE SHIELD | Admitting: Internal Medicine

## 2017-07-21 DIAGNOSIS — Z2089 Contact with and (suspected) exposure to other communicable diseases: Secondary | ICD-10-CM

## 2017-07-22 ENCOUNTER — Telehealth: Payer: Self-pay | Admitting: Internal Medicine

## 2017-07-22 ENCOUNTER — Other Ambulatory Visit: Payer: Self-pay | Admitting: Internal Medicine

## 2017-07-22 DIAGNOSIS — F419 Anxiety disorder, unspecified: Secondary | ICD-10-CM

## 2017-07-22 MED ORDER — ALPRAZOLAM 0.5 MG PO TABS: 0.5000 mg | ORAL_TABLET | Freq: Every day | ORAL | 1 refills | Status: DC | PRN

## 2017-07-22 NOTE — Telephone Encounter (Signed)
Refill request forXananx, last seen 15MAR2019, last filled [id number].  Please advise.

## 2017-07-22 NOTE — Telephone Encounter (Signed)
Pt needs a refill on ALPRAZolam (XANAX) 0.5 MG tablet. Please advise

## 2017-07-30 ENCOUNTER — Ambulatory Visit (INDEPENDENT_AMBULATORY_CARE_PROVIDER_SITE_OTHER): Payer: BLUE CROSS/BLUE SHIELD

## 2017-07-30 ENCOUNTER — Ambulatory Visit (INDEPENDENT_AMBULATORY_CARE_PROVIDER_SITE_OTHER): Payer: BLUE CROSS/BLUE SHIELD | Admitting: Internal Medicine

## 2017-07-30 ENCOUNTER — Encounter: Payer: Self-pay | Admitting: Internal Medicine

## 2017-07-30 VITALS — BP 126/68 | HR 75 | Temp 97.8°F | Ht 62.5 in | Wt 166.6 lb

## 2017-07-30 DIAGNOSIS — M545 Low back pain, unspecified: Secondary | ICD-10-CM | POA: Insufficient documentation

## 2017-07-30 DIAGNOSIS — R768 Other specified abnormal immunological findings in serum: Secondary | ICD-10-CM | POA: Diagnosis not present

## 2017-07-30 DIAGNOSIS — E669 Obesity, unspecified: Secondary | ICD-10-CM

## 2017-07-30 DIAGNOSIS — F419 Anxiety disorder, unspecified: Secondary | ICD-10-CM

## 2017-07-30 DIAGNOSIS — B9689 Other specified bacterial agents as the cause of diseases classified elsewhere: Secondary | ICD-10-CM | POA: Diagnosis not present

## 2017-07-30 DIAGNOSIS — E785 Hyperlipidemia, unspecified: Secondary | ICD-10-CM

## 2017-07-30 DIAGNOSIS — N760 Acute vaginitis: Secondary | ICD-10-CM

## 2017-07-30 DIAGNOSIS — G4700 Insomnia, unspecified: Secondary | ICD-10-CM

## 2017-07-30 DIAGNOSIS — G8929 Other chronic pain: Secondary | ICD-10-CM | POA: Diagnosis not present

## 2017-07-30 MED ORDER — PREDNISONE 20 MG PO TABS: 20.0000 mg | ORAL_TABLET | Freq: Every day | ORAL | 0 refills | Status: DC

## 2017-07-30 MED ORDER — OXYCODONE-ACETAMINOPHEN 5-325 MG PO TABS: 1.0000 | ORAL_TABLET | Freq: Two times a day (BID) | ORAL | 0 refills | Status: DC | PRN

## 2017-07-30 MED ORDER — ZOLPIDEM TARTRATE 5 MG PO TABS: 5.0000 mg | ORAL_TABLET | Freq: Every evening | ORAL | 2 refills | Status: DC | PRN

## 2017-07-30 MED ORDER — CYCLOBENZAPRINE HCL 5 MG PO TABS: 5.0000 mg | ORAL_TABLET | Freq: Every evening | ORAL | 0 refills | Status: DC | PRN

## 2017-07-30 MED ORDER — PHENTERMINE HCL 37.5 MG PO TABS: 37.5000 mg | ORAL_TABLET | Freq: Every day | ORAL | 0 refills | Status: DC

## 2017-07-30 NOTE — Progress Notes (Signed)
Chief Complaint  Patient presents with  . Follow-up   F/u  1. C/o low back pain w/o radiation 5/10 today tried aleve without much relief. Back pain is throbbing, She has a chiropractor at work who just worked on right shoulder which helped.  2. +ANA pending rheumatology to schedule pt has not scheduled and dermatology appt is scheduled.   3. She needs medication refills of adipex and has lost 6 lbs also needs ambien. She has psych f/u 07/2017    Review of Systems  Constitutional: Positive for weight loss.  HENT: Negative for hearing loss.   Eyes: Negative for blurred vision.  Respiratory: Negative for shortness of breath.   Cardiovascular: Negative for chest pain.  Gastrointestinal: Negative for abdominal pain.  Musculoskeletal: Positive for back pain and joint pain.  Skin: Negative for rash.  Psychiatric/Behavioral: The patient has insomnia.    Past Medical History:  Diagnosis Date  . Anemia    with PICA sx's   . Anxiety   . Bacterial vaginosis   . Chicken pox   . Depression   . HSV (herpes simplex virus) infection    Past Surgical History:  Procedure Laterality Date  . APPENDECTOMY     52 y.o.   . AUGMENTATION MAMMAPLASTY Bilateral   . COLONOSCOPY WITH PROPOFOL N/A 05/02/2017   Procedure: COLONOSCOPY WITH PROPOFOL;  Surgeon: Pasty Spillers, MD;  Location: [facility] ENDOSCOPY;  Service: Endoscopy;  Laterality: N/A;  . FRACTURE SURGERY     Left thumb   Family History  Problem Relation Age of Onset  . Arthritis Mother   . Cancer Mother        breast  . Hearing loss Mother   . Hypertension Mother   . Breast cancer Mother 5  . Arthritis Father   . Cancer Father        colon dx'ed age 9s   . Hypertension Father   . Heart disease Father        MI  . Diabetes Father   . Mental illness Son        [facility]   Social History   Socioeconomic History  . Marital status: Divorced    Spouse name: Not on file  . Number of children: Not on file  . Years of education: Not  on file  . Highest education level: Not on file  Occupational History  . Not on file  Social Needs  . Financial resource strain: Not on file  . Food insecurity:    Worry: Not on file    Inability: Not on file  . Transportation needs:    Medical: Not on file    Non-medical: Not on file  Tobacco Use  . Smoking status: Current Every Day Smoker  . Smokeless tobacco: Never Used  . Tobacco comment: <1ppd x 30 years no FH lung cancer   Substance and Sexual Activity  . Alcohol use: Yes    Comment: socially per pt   . Drug use: No  . Sexual activity: Yes    Comment: men  Lifestyle  . Physical activity:    Days per week: Not on file    Minutes per session: Not on file  . Stress: Not on file  Relationships  . Social connections:    Talks on phone: Not on file    Gets together: Not on file    Attends religious service: Not on file    Active member of club or organization: Not on file    Attends  meetings of clubs or organizations: Not on file    Relationship status: Not on file  . Intimate partner violence:    Fear of current or ex partner: Not on file    Emotionally abused: Not on file    Physically abused: Not on file    Forced sexual activity: Not on file  Other Topics Concern  . 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 ALPRAZolam (XANAX) 0.5 MG tablet Take 1 tablet (0.5 mg total) by mouth daily as needed for anxiety.  . clobetasol (TEMOVATE) 0.05 % external solution Apply 1 application topically 2 (two) times daily as needed. Scalp  . phentermine (ADIPEX-P) 37.5 MG tablet Take 1 tablet (37.5 mg total) by mouth daily before breakfast.  . triamcinolone cream (KENALOG) 0.1 % Bid elbows avoid face/groin/private  . venlafaxine XR (EFFEXOR-XR) 75 MG 24 hr capsule Take 1 capsule (75 mg total) by mouth daily with  breakfast.  . zolpidem (AMBIEN) 5 MG tablet Take 1 tablet (5 mg total) by mouth at bedtime as needed for sleep.  . [DISCONTINUED] metroNIDAZOLE (FLAGYL) 500 MG tablet Take 1 tablet (500 mg total) by mouth 2 (two) times daily.  . [DISCONTINUED] phentermine (ADIPEX-P) 37.5 MG tablet Take 1 tablet (37.5 mg total) by mouth daily before breakfast.  . [DISCONTINUED] zolpidem (AMBIEN) 5 MG tablet Take 1 tablet (5 mg total) by mouth at bedtime as needed for sleep.   Allergies  Allergen Reactions  . Penicillins Swelling    Angioedema  . Codeine Rash    Elevated heartrate   Recent Results (from the past 2160 hour(s))  Pregnancy, urine POC     Status: None   Collection Time: 05/02/17  9:05 AM  Result Value Ref Range   Preg Test, Ur NEGATIVE NEGATIVE    Comment:        THE SENSITIVITY OF THIS METHODOLOGY IS >24 mIU/mL    Objective  Body mass index is 29.99 kg/m. Wt Readings from Last 3 Encounters:  07/30/17 166 lb 9.6 oz (75.6 kg)  06/13/17 172 lb (78 kg)  05/02/17 175 lb (79.4 kg)   Temp Readings from Last 3 Encounters:  07/30/17 97.8 F (36.6 C) (Oral)  06/13/17 98.1 F (36.7 C) (Oral)  05/02/17 (!) 97.3 F (36.3 C)   BP Readings from Last 3 Encounters:  07/30/17 126/68  06/13/17 122/82  05/02/17 113/76   Pulse Readings from Last 3 Encounters:  07/30/17 75  06/13/17 74  05/02/17 67    Physical Exam  Constitutional: She is oriented to person, place, and time. Vital signs are normal. She appears well-developed and well-nourished. She is cooperative.  HENT:  Head: Normocephalic and atraumatic.  Mouth/Throat: Oropharynx is clear and moist and mucous membranes are normal.  Eyes: Pupils are equal, round, and reactive to light. Conjunctivae are normal.  Cardiovascular: Normal rate, regular rhythm and normal heart sounds.  Pulmonary/Chest: Effort normal and breath sounds normal.  Musculoskeletal:       Lumbar back: She exhibits tenderness.  Neg str8 leg test b/l    Neurological: She is alert and oriented to person, place, and time. Gait normal.  Skin: Skin is warm, dry and intact.  Psychiatric: She has a normal mood and affect.  Her speech is normal and behavior is normal. Judgment and thought content normal. Cognition and memory are normal.  Nursing note and vitals reviewed.   Assessment   1. Low back pain w/o radiculopathy b/l left and right  2. Obesity with wt loss 6 lbs since last visit  3. Insomnia, anxiety and depression  4. +ANA 5. HM Plan  1. Xray low back today  Pt wants to wait on PT due to time and money will disc with chiropractor at her job  Prn percocet, flexeril, prednisone 20 mg x 5 days, prn otc meds  Demonstrated back exercises  2. 3/3 adipex today  F/u in 3-4 months  Cont. Healthy diet choices and exericse  3. Refilled Remus Loffler  F/u with psych  4.  Has not sch appt rheumatology  5.   Had flu and Tdap  Given hep B vaccine 2/3, 3rd due 10/26/17   Pap today 04/28/17 neg neg hpv +BV (see above) sx's resolved  mammo sch 07/08/17 neg  Colonoscopy had 05/02/17 normal diverticulosis repeat in 5 years [facility]  rec smoking cessation  Refer to dermatology today Dr. Roseanne Kaufman appt pending   Provider: Dr. French Ana McLean-Scocuzza-[facility]

## 2017-07-30 NOTE — Progress Notes (Signed)
Pre visit review using our clinic review tool, if applicable. No additional management support is needed unless otherwise documented below in the visit note. 

## 2017-07-30 NOTE — Patient Instructions (Addendum)
F/u in 3 months sooner if needed   Back Pain, Adult Many adults have back pain from time to time. Common causes of back pain include:  A strained muscle or ligament.  Wear and tear (degeneration) of the spinal disks.  Arthritis.  A hit to the back.  Back pain can be short-lived (acute) or last a long time (chronic). A physical exam, lab tests, and imaging studies may be done to find the cause of your pain. Follow these instructions at home: Managing pain and stiffness  Take over-the-counter and prescription medicines only as told by your health care provider.  If directed, apply heat to the affected area as often as told by your health care provider. Use the heat source that your health care provider recommends, such as a moist heat pack or a heating pad. ? Place a towel between your skin and the heat source. ? Leave the heat on for 20-30 minutes. ? Remove the heat if your skin turns bright red. This is especially important if you are unable to feel pain, heat, or cold. You have a greater risk of getting burned.  If directed, apply ice to the injured area: ? Put ice in a plastic bag. ? Place a towel between your skin and the bag. ? Leave the ice on for 20 minutes, 2-3 times a day for the first 2-3 days. Activity  Do not stay in bed. Resting more than 1-2 days can delay your recovery.  Take short walks on even surfaces as soon as you are able. Try to increase the length of time you walk each day.  Do not sit, drive, or stand in one place for more than 30 minutes at a time. Sitting or standing for long periods of time can put stress on your back.  Use proper lifting techniques. When you bend and lift, use positions that put less stress on your back: ? C-Road your knees. ? Keep the load close to your body. ? Avoid twisting.  Exercise regularly as told by your health care provider. Exercising will help your back heal faster. This also helps prevent back injuries by keeping muscles  strong and flexible.  Your health care provider may recommend that you see a physical therapist. This person can help you come up with a safe exercise program. Do any exercises as told by your physical therapist. Lifestyle  Maintain a healthy weight. Extra weight puts stress on your back and makes it difficult to have good posture.  Avoid activities or situations that make you feel anxious or stressed. Learn ways to manage anxiety and stress. One way to manage stress is through exercise. Stress and anxiety increase muscle tension and can make back pain worse. General instructions  Sleep on a firm mattress in a comfortable position. Try lying on your side with your knees slightly bent. If you lie on your back, put a pillow under your knees.  Follow your treatment plan as told by your health care provider. This may include: ? Cognitive or behavioral therapy. ? Acupuncture or massage therapy. ? Meditation or yoga. Contact a health care provider if:  You have pain that is not relieved with rest or medicine.  You have increasing pain going down into your legs or buttocks.  Your pain does not improve in 2 weeks.  You have pain at night.  You lose weight.  You have a fever or chills. Get help right away if:  You develop new bowel or bladder control problems.  You have unusual weakness or numbness in your arms or legs.  You develop nausea or vomiting.  You develop abdominal pain.  You feel faint. Summary  Many adults have back pain from time to time. A physical exam, lab tests, and imaging studies may be done to find the cause of your pain.  Use proper lifting techniques. When you bend and lift, use positions that put less stress on your back.  Take over-the-counter and prescription medicines and apply heat or ice as directed by your health care provider. This information is not intended to replace advice given to you by your health care provider. Make sure you discuss any  questions you have with your health care provider. Document Released: 03/18/2005 Document Revised: 04/22/2016 Document Reviewed: 04/22/2016 Elsevier Interactive Patient Education  [employer].

## 2017-08-01 NOTE — Progress Notes (Signed)
error 

## 2017-08-22 ENCOUNTER — Ambulatory Visit: Payer: BLUE CROSS/BLUE SHIELD | Admitting: Internal Medicine

## 2017-08-26 ENCOUNTER — Ambulatory Visit: Payer: BLUE CROSS/BLUE SHIELD | Admitting: Internal Medicine

## 2017-08-27 ENCOUNTER — Other Ambulatory Visit: Payer: Self-pay | Admitting: Internal Medicine

## 2017-08-27 DIAGNOSIS — M545 Low back pain, unspecified: Secondary | ICD-10-CM

## 2017-08-27 DIAGNOSIS — G8929 Other chronic pain: Secondary | ICD-10-CM

## 2017-08-27 NOTE — Telephone Encounter (Signed)
Oxycodone refill Last Refill:07/30/17 #10 Last OV: 07/30/17 PCP: Dr. Judie Grieve Pharmacy:[facility]    [address]  Flexeril refill Last Refill:07/30/17 #30 Last OV: 07/30/17

## 2017-08-27 NOTE — Telephone Encounter (Signed)
Copied from CRM [id number]. Topic: Quick Communication - Rx Refill/Question >> Aug 27, 2017 11:51 AM Louie Bun, Rosey Bath D wrote: Medication: oxyCODONE-acetaminophen (PERCOCET) 5-325 MG tablet, cyclobenzaprine (FLEXERIL) 5 MG tablet   Has the patient contacted their pharmacy? Yes (Agent: If no, request that the patient contact the pharmacy for the refill.) (Agent: If yes, when and what did the pharmacy advise?)  Preferred Pharmacy (with phone number or street name): [id number] - [place] ([place]), [place] - [address]  Agent: Please be advised that RX refills may take up to 3 business days. We ask that you follow-up with your pharmacy.

## 2017-08-28 ENCOUNTER — Telehealth: Payer: Self-pay | Admitting: Internal Medicine

## 2017-08-28 ENCOUNTER — Other Ambulatory Visit: Payer: Self-pay | Admitting: Internal Medicine

## 2017-08-28 DIAGNOSIS — M545 Low back pain, unspecified: Secondary | ICD-10-CM

## 2017-08-28 DIAGNOSIS — G8929 Other chronic pain: Secondary | ICD-10-CM

## 2017-08-28 MED ORDER — CYCLOBENZAPRINE HCL 5 MG PO TABS: 5.0000 mg | ORAL_TABLET | Freq: Every evening | ORAL | 0 refills | Status: DC | PRN

## 2017-08-28 NOTE — Telephone Encounter (Signed)
Pain medications not long term denied percocet   Does she want PT or referral to ortho spine or neurosurgery for her back  Can try mobic does she want to do that?   and alternate with tylenol 500 mg up to 6 pills in 1 day   TMS

## 2017-08-29 NOTE — Telephone Encounter (Signed)
Left detailed VM informing patient of below and to return call and let us know her answers to the questions from Dr. French Ana

## 2017-09-09 ENCOUNTER — Telehealth: Payer: Self-pay

## 2017-09-09 ENCOUNTER — Telehealth: Payer: Self-pay | Admitting: Internal Medicine

## 2017-09-09 NOTE — Telephone Encounter (Signed)
Copied from CRM [phone]. Topic: Referral - Request >> Sep 09, 2017  3:32 PM Cipriano BunkerLambe, Annette S wrote: Reason for CRM:  pt. Is asking for referral for Rheumatologist in [place]. Per doctor

## 2017-09-09 NOTE — Telephone Encounter (Signed)
Copied from CRM [phone] [phone]. Topic: Referral - Request >> Sep 09, 2017  3:32 PM Cipriano BunkerLambe, Annette S wrote: Reason for CRM:  pt. Is asking for referral for Rheumatologist in [place].  >> Sep 09, 2017  3:36 PM Cipriano BunkerLambe, Annette S wrote: She is also asking for the Dermatologist referral too.

## 2017-09-09 NOTE — Telephone Encounter (Signed)
Sent phone encounter for this

## 2017-09-10 NOTE — Telephone Encounter (Signed)
Please advise 

## 2017-09-23 ENCOUNTER — Other Ambulatory Visit: Payer: Self-pay | Admitting: Internal Medicine

## 2017-09-23 DIAGNOSIS — R768 Other specified abnormal immunological findings in serum: Secondary | ICD-10-CM

## 2017-09-23 NOTE — Telephone Encounter (Signed)
Dermatology referral placed please check status  Made rheumatology referral   TMS

## 2017-09-24 ENCOUNTER — Encounter: Payer: Self-pay | Admitting: Internal Medicine

## 2017-09-24 NOTE — Telephone Encounter (Signed)
[facility] had tried calling her multiple times to schedule but she never returned their call. I will give her [facility] number to call to schedule this.

## 2017-09-26 ENCOUNTER — Telehealth: Payer: Self-pay | Admitting: Internal Medicine

## 2017-09-26 NOTE — Telephone Encounter (Signed)
Copied from CRM [id number]. Topic: Quick Communication - See Telephone Encounter >> Sep 26, 2017  9:38 AM Mare LoanBurton, Donna F wrote: Pt is needing a rx called in for a yeast infection and tessalon pearls   Best number [phone] ok to leave a message   [facility] [address]

## 2017-09-29 NOTE — Telephone Encounter (Signed)
Please advise 

## 2017-09-29 NOTE — Telephone Encounter (Signed)
Ok to refill 

## 2017-09-30 NOTE — Telephone Encounter (Signed)
Yes infection or bacterial vaginosis?  Why does she need tessalon perles?  Has she tried Mucinex DM or robitussin DM otc? For cough ? If cough not better needs appt    Sounds like needs appt if having new discharge sx's we have tx'ed BV in the past    TMS

## 2017-10-01 NOTE — Telephone Encounter (Signed)
Left message for patient to return call back. [facility] may give obtain information.  

## 2017-10-01 NOTE — Telephone Encounter (Signed)
Patient thinks she has BV.  She needs tessalon pearls for a cough she's had off and on that is irritating at night.   She has tried both otc medications and says she's spent too much money on those and it's not helping.  Please advise.

## 2017-10-03 NOTE — Telephone Encounter (Signed)
Called left message for patient to call office she will need appointment o be evaluated for BV and cough. [facility] pool may advise and schedule.

## 2017-10-03 NOTE — Telephone Encounter (Signed)
Patient will call back and schedule as soon as she checks schedule at work.

## 2017-10-28 ENCOUNTER — Ambulatory Visit: Payer: BLUE CROSS/BLUE SHIELD

## 2017-10-29 ENCOUNTER — Ambulatory Visit: Payer: BLUE CROSS/BLUE SHIELD

## 2017-10-31 ENCOUNTER — Ambulatory Visit: Payer: BLUE CROSS/BLUE SHIELD | Admitting: Internal Medicine

## 2017-10-31 ENCOUNTER — Encounter: Payer: Self-pay | Admitting: Internal Medicine

## 2017-10-31 DIAGNOSIS — Z0289 Encounter for other administrative examinations: Secondary | ICD-10-CM

## 2017-11-18 ENCOUNTER — Other Ambulatory Visit: Payer: Self-pay | Admitting: Internal Medicine

## 2017-11-18 DIAGNOSIS — M545 Low back pain, unspecified: Secondary | ICD-10-CM

## 2017-11-18 DIAGNOSIS — G8929 Other chronic pain: Secondary | ICD-10-CM

## 2017-11-18 NOTE — Telephone Encounter (Signed)
Copied from CRM [id number]. Topic: Quick Communication - Rx Refill/Question >> Nov 18, 2017 12:38 PM Baldo DaubAlexander, Amber L wrote: Medication: cyclobenzaprine (FLEXERIL) 5 MG tablet  Has the patient contacted their pharmacy? Yes - states they need script from doctor (Agent: If no, request that the patient contact the pharmacy for the refill.) (Agent: If yes, when and what did the pharmacy advise?)  Preferred Pharmacy (with phone number or street name): [facility] [address] - [place] ([place]), [place] - [address] [phone] (Phone) [phone] (Fax)  Agent: Please be advised that RX refills may take up to 3 business days. We ask that you follow-up with your pharmacy.

## 2017-11-18 NOTE — Telephone Encounter (Signed)
Refill of flexeril  LOV 07/30/17  Dr. Judie GrieveMcLean-Scocuzza  LRF 08/28/17  #30  0 refills  [facility] [address] - [place] ([place]), [place] - [address]   [phone] (Phone) [phone] (Fax)

## 2017-11-25 ENCOUNTER — Ambulatory Visit: Payer: BLUE CROSS/BLUE SHIELD

## 2017-11-26 ENCOUNTER — Other Ambulatory Visit: Payer: Self-pay | Admitting: Internal Medicine

## 2017-11-26 ENCOUNTER — Telehealth: Payer: Self-pay | Admitting: Internal Medicine

## 2017-11-26 DIAGNOSIS — F419 Anxiety disorder, unspecified: Secondary | ICD-10-CM

## 2017-11-26 MED ORDER — ALPRAZOLAM 0.5 MG PO TABS: 0.5000 mg | ORAL_TABLET | Freq: Every day | ORAL | 2 refills | Status: DC | PRN

## 2017-11-26 NOTE — Telephone Encounter (Signed)
Copied from CRM [id number]. Topic: Quick Communication - Rx Refill/Question >> Nov 26, 2017  8:49 AM Shelly Stevenson, Shelly Stevenson wrote: Pt lost medications  Medication: ALPRAZolam (XANAX) 0.5 MG tablet [[id number]][[id number]]  zolpidem (AMBIEN) 5 MG tablet [[id number]][[id number]]   Has the patient contacted their pharmacy? Yes.   (Agent: If no, request that the patient contact the pharmacy for the refill.) (Agent: If yes, when and what did the pharmacy advise?)  [facility]  Agent: Please be advised that RX refills may take up to 3 business days. We ask that you follow-up with your pharmacy.

## 2017-12-04 ENCOUNTER — Ambulatory Visit (INDEPENDENT_AMBULATORY_CARE_PROVIDER_SITE_OTHER): Payer: BLUE CROSS/BLUE SHIELD

## 2017-12-04 DIAGNOSIS — Z23 Encounter for immunization: Secondary | ICD-10-CM

## 2017-12-04 MED ORDER — CYCLOBENZAPRINE HCL 5 MG PO TABS: 5.0000 mg | ORAL_TABLET | Freq: Every evening | ORAL | 2 refills | Status: DC | PRN

## 2017-12-04 NOTE — Progress Notes (Signed)
Patient comes in for Hepatitis B injection .  Injected left deltoid . Patient tolerated injection well.  

## 2017-12-06 ENCOUNTER — Other Ambulatory Visit: Payer: Self-pay

## 2017-12-06 ENCOUNTER — Encounter: Payer: Self-pay | Admitting: *Deleted

## 2017-12-06 ENCOUNTER — Emergency Department
Admission: EM | Admit: 2017-12-06 | Discharge: 2017-12-06 | Disposition: A | Discharge status: Home / Self Care | Payer: BLUE CROSS/BLUE SHIELD | Attending: Emergency Medicine | Admitting: Emergency Medicine

## 2017-12-06 DIAGNOSIS — F17210 Nicotine dependence, cigarettes, uncomplicated: Secondary | ICD-10-CM | POA: Insufficient documentation

## 2017-12-06 DIAGNOSIS — M62830 Muscle spasm of back: Secondary | ICD-10-CM | POA: Diagnosis not present

## 2017-12-06 DIAGNOSIS — Z79899 Other long term (current) drug therapy: Secondary | ICD-10-CM | POA: Diagnosis not present

## 2017-12-06 DIAGNOSIS — M5489 Other dorsalgia: Secondary | ICD-10-CM | POA: Diagnosis present

## 2017-12-06 MED ORDER — ORPHENADRINE CITRATE 30 MG/ML IJ SOLN
60.0000 mg | Freq: Once | INTRAMUSCULAR | Status: AC
  Administered 2017-12-06: 60 mg via INTRAMUSCULAR
  Filled 2017-12-06: qty 2

## 2017-12-06 MED ORDER — MELOXICAM 15 MG PO TABS: 15.0000 mg | ORAL_TABLET | Freq: Every day | ORAL | 0 refills | Status: DC

## 2017-12-06 MED ORDER — KETOROLAC TROMETHAMINE 30 MG/ML IJ SOLN
30.0000 mg | Freq: Once | INTRAMUSCULAR | Status: AC
  Administered 2017-12-06: 30 mg via INTRAMUSCULAR
  Filled 2017-12-06: qty 1

## 2017-12-06 MED ORDER — METHOCARBAMOL 500 MG PO TABS: 500.0000 mg | ORAL_TABLET | Freq: Four times a day (QID) | ORAL | 0 refills | Status: DC

## 2017-12-06 NOTE — ED Triage Notes (Signed)
Pt to ED reporting chronic back pain and spasms that have worsened today. Pt reports the"knots" in her lower back have been hurting today with intermittent spasms.

## 2017-12-06 NOTE — ED Notes (Addendum)
Pt presents to ED with c/o lower back pain and spasms. Pt states hx of scoliosis, arthritis, and lower back spasms. Pt states no known injury, is being followed by Dr. Jaci Lazier at [facility].

## 2017-12-06 NOTE — ED Provider Notes (Signed)
[facility] Emergency Department Provider Note  ____________________________________________  Time seen: Approximately 3:18 PM  I have reviewed the triage vital signs and the nursing notes.   HISTORY  Chief Complaint Back Pain    HPI Shelly Stevenson is a 52 y.o. female patient presents the emergency department complaining of diffuse back pain and spasms.  Patient reports that she has a history of scoliosis, degenerative disc disease, intermittent muscle spasms.  Patient states that she has no known injury.  No falls, direct trauma to the area.  Patient reports that it is gradually been worsening today.  Patient states that she had to leave work due to the tightness and pain.  She denies any radicular symptoms in the upper or lower extremity.  Patient has not tried medication prior to arrival.   Patient denies any headache, visual changes, chest pain, shortness of breath, abdominal pain, nausea vomiting, dysuria, polyuria, hematuria.  No bowel or bladder dysfunction, saddle anesthesia, paresthesias.   Past Medical History:  Diagnosis Date  . Anemia    with PICA sx's   . Anxiety   . Bacterial vaginosis   . Chicken pox   . Depression   . HSV (herpes simplex virus) infection     Patient Active Problem List   Diagnosis Date Noted  . Low back pain 07/30/2017  . HLD (hyperlipidemia) 06/13/2017  . Bacterial vaginosis 06/13/2017  . Obesity (BMI 30-39.9) 06/13/2017  . Rash 06/13/2017  . Special screening for malignant neoplasms, colon   . Positive ANA (antinuclear antibody) 04/28/2017  . Anxiety 03/24/2017  . Depression, recurrent (HCC) 03/24/2017  . Menopause 03/24/2017  . Insomnia 03/24/2017  . Psoriasis 03/24/2017  . Arthralgia 03/24/2017  . Hot flashes 03/24/2017  . Anemia 03/24/2017  . Bilateral carpal tunnel syndrome 03/24/2017    Past Surgical History:  Procedure Laterality Date  . APPENDECTOMY     52 y.o.   . AUGMENTATION MAMMAPLASTY Bilateral    . COLONOSCOPY WITH PROPOFOL N/A 05/02/2017   Procedure: COLONOSCOPY WITH PROPOFOL;  Surgeon: Pasty Spillers, MD;  Location: [facility] ENDOSCOPY;  Service: Endoscopy;  Laterality: N/A;  . FRACTURE SURGERY     Left thumb    Prior to Admission medications   Medication Sig Start Date End Date Taking? Authorizing Provider  ALPRAZolam Prudy Feeler) 0.5 MG tablet Take 1 tablet (0.5 mg total) by mouth daily as needed for anxiety. 11/26/17   McLean-Scocuzza, Pasty Spillers, MD  clobetasol (TEMOVATE) 0.05 % external solution Apply 1 application topically 2 (two) times daily as needed. Scalp 03/24/17   McLean-Scocuzza, Pasty Spillers, MD  cyclobenzaprine (FLEXERIL) 5 MG tablet Take 1 tablet (5 mg total) by mouth at bedtime as needed for muscle spasms. 12/04/17   McLean-Scocuzza, Pasty Spillers, MD  meloxicam (MOBIC) 15 MG tablet Take 1 tablet (15 mg total) by mouth daily. 12/06/17   Maksymilian Mabey, Delorise Royals, PA-C  methocarbamol (ROBAXIN) 500 MG tablet Take 1 tablet (500 mg total) by mouth 4 (four) times daily. 12/06/17   Kayte Borchard, Delorise Royals, PA-C  oxyCODONE-acetaminophen (PERCOCET) 5-325 MG tablet Take 1 tablet by mouth 2 (two) times daily as needed for severe pain. 07/30/17   McLean-Scocuzza, Pasty Spillers, MD  phentermine (ADIPEX-P) 37.5 MG tablet Take 1 tablet (37.5 mg total) by mouth daily before breakfast. 07/30/17   McLean-Scocuzza, Pasty Spillers, MD  predniSONE (DELTASONE) 20 MG tablet Take 1 tablet (20 mg total) by mouth daily with breakfast. 07/30/17   McLean-Scocuzza, Pasty Spillers, MD  triamcinolone cream (KENALOG) 0.1 % Bid elbows  avoid face/groin/private 03/24/17   McLean-Scocuzza, Pasty Spillers, MD  venlafaxine XR (EFFEXOR-XR) 75 MG 24 hr capsule Take 1 capsule (75 mg total) by mouth daily with breakfast. 06/13/17   McLean-Scocuzza, Pasty Spillers, MD  zolpidem (AMBIEN) 5 MG tablet Take 1 tablet (5 mg total) by mouth at bedtime as needed for sleep. 07/30/17   McLean-Scocuzza, Pasty Spillers, MD    Allergies Penicillins and Codeine  Family History  Problem Relation Age  of Onset  . Arthritis Mother   . Cancer Mother        breast  . Hearing loss Mother   . Hypertension Mother   . Breast cancer Mother 32  . Arthritis Father   . Cancer Father        colon dx'ed age 46s   . Hypertension Father   . Heart disease Father        MI  . Diabetes Father   . Mental illness Son        [facility]    Social History Social History   Tobacco Use  . Smoking status: Current Every Day Smoker  . Smokeless tobacco: Never Used  . Tobacco comment: <1ppd x 30 years no FH lung cancer   Substance Use Topics  . Alcohol use: Yes    Comment: socially per pt   . Drug use: No     Review of Systems  Constitutional: No fever/chills Eyes: No visual changes. No discharge ENT: No upper respiratory complaints. Cardiovascular: no chest pain. Respiratory: no cough. No SOB. Gastrointestinal: No abdominal pain.  No nausea, no vomiting.   Genitourinary: Negative for dysuria. No hematuria Musculoskeletal: Positive for diffuse back pain and muscle spasms.  No radicular symptoms. Skin: Negative for rash, abrasions, lacerations, ecchymosis. Neurological: Negative for headaches, focal weakness or numbness. 10-point ROS otherwise negative.  ____________________________________________   PHYSICAL EXAM:  VITAL SIGNS: ED Triage Vitals  Enc Vitals Group     BP 12/06/17 1358 123/90     Pulse Rate 12/06/17 1358 80     Resp 12/06/17 1358 16     Temp 12/06/17 1358 98.1 F (36.7 C)     Temp Source 12/06/17 1358 Oral     SpO2 12/06/17 1358 96 %     Weight 12/06/17 1359 159 lb (72.1 kg)     Height 12/06/17 1359 5\' 5"  (1.651 m)     Head Circumference --      Peak Flow --      Pain Score 12/06/17 1358 7     Pain Loc --      Pain Edu? --      Excl. in GC? --      Constitutional: Alert and oriented. Well appearing and in no acute distress. Eyes: Conjunctivae are normal. PERRL. EOMI. Head: Atraumatic. Neck: No stridor.  No cervical spine tenderness to palpation.   Cardiovascular: Normal rate, regular rhythm. Normal S1 and S2.  Good peripheral circulation. Respiratory: Normal respiratory effort without tachypnea or retractions. Lungs CTAB. Good air entry to the bases with no decreased or absent breath sounds. Gastrointestinal: Bowel sounds 4 quadrants. Soft and nontender to palpation. No guarding or rigidity. No palpable masses. No distention. No CVA tenderness. Musculoskeletal: Full range of motion to all extremities. No gross deformities appreciated.  Visualization of the lumbar spine reveals no gross deformity, abnormality.  Patient has no tenderness to palpation midline.  Intermittent tenderness to palpation bilaterally over paraspinal muscle groups.  Intermittent spasms are also appreciated.  No other palpable abnormality.  No  tenderness to palpation of bilateral sciatic notches.  Negative straight leg raise bilaterally.  Dorsalis pedis pulse intact bilateral lower extremities.  Sensation intact and equal in all dermatomal distributions bilateral lower extremities. Neurologic:  Normal speech and language. No gross focal neurologic deficits are appreciated.  Skin:  Skin is warm, dry and intact. No rash noted. Psychiatric: Mood and affect are normal. Speech and behavior are normal. Patient exhibits appropriate insight and judgement.   ____________________________________________   LABS (all labs ordered are listed, but only abnormal results are displayed)  Labs Reviewed - No data to display ____________________________________________  EKG   ____________________________________________  RADIOLOGY   No results found.  ____________________________________________    PROCEDURES  Procedure(s) performed:    Procedures    Medications  ketorolac (TORADOL) 30 MG/ML injection 30 mg (has no administration in time range)  orphenadrine (NORFLEX) injection 60 mg (has no administration in time range)      ____________________________________________   INITIAL IMPRESSION / ASSESSMENT AND PLAN / ED COURSE  Pertinent labs & imaging results that were available during my care of the patient were reviewed by me and considered in my medical decision making (see chart for details).  Review of the [place] CSRS was performed in accordance of the [facility] prior to dispensing any controlled drugs.      Patient's diagnosis is consistent with muscle spasms of the back.  Patient presents the emergency department complaining of diffuse back pain with muscle spasms.  On exam, no tenderness midline.  No indication for labs or imaging.  Patient does have multiple areas of palpable spasms in the paraspinal muscle group.  Patient is given Toradol and Norflex injections in the emergency department for symptom relief.. Patient will be discharged home with prescriptions for meloxicam and Robaxin for symptom control. Patient is to follow up with primary care as needed or otherwise directed. Patient is given ED precautions to return to the ED for any worsening or new symptoms.     ____________________________________________  FINAL CLINICAL IMPRESSION(S) / ED DIAGNOSES  Final diagnoses:  Muscle spasm of back      NEW MEDICATIONS STARTED DURING THIS VISIT:  ED Discharge Orders         Ordered    meloxicam (MOBIC) 15 MG tablet  Daily     12/06/17 1524    methocarbamol (ROBAXIN) 500 MG tablet  4 times daily     12/06/17 1524              This chart was dictated using voice recognition software/Dragon. Despite best efforts to proofread, errors can occur which can change the meaning. Any change was purely unintentional.    Racheal Patches, PA-C 12/06/17 1527    Jene Every, MD 12/06/17 1550

## 2017-12-12 ENCOUNTER — Encounter: Payer: Self-pay | Admitting: Internal Medicine

## 2017-12-12 ENCOUNTER — Ambulatory Visit (INDEPENDENT_AMBULATORY_CARE_PROVIDER_SITE_OTHER): Payer: BLUE CROSS/BLUE SHIELD | Admitting: Internal Medicine

## 2017-12-12 VITALS — BP 130/64 | HR 86 | Temp 98.1°F | Ht 65.0 in | Wt 169.5 lb

## 2017-12-12 DIAGNOSIS — E663 Overweight: Secondary | ICD-10-CM

## 2017-12-12 DIAGNOSIS — M62838 Other muscle spasm: Secondary | ICD-10-CM | POA: Diagnosis not present

## 2017-12-12 DIAGNOSIS — F339 Major depressive disorder, recurrent, unspecified: Secondary | ICD-10-CM

## 2017-12-12 DIAGNOSIS — Z780 Asymptomatic menopausal state: Secondary | ICD-10-CM

## 2017-12-12 DIAGNOSIS — R490 Dysphonia: Secondary | ICD-10-CM

## 2017-12-12 DIAGNOSIS — E785 Hyperlipidemia, unspecified: Secondary | ICD-10-CM

## 2017-12-12 DIAGNOSIS — F419 Anxiety disorder, unspecified: Secondary | ICD-10-CM

## 2017-12-12 MED ORDER — PHENTERMINE HCL 37.5 MG PO TABS: 37.5000 mg | ORAL_TABLET | Freq: Every day | ORAL | 0 refills | Status: DC

## 2017-12-12 MED ORDER — METHOCARBAMOL 500 MG PO TABS: 500.0000 mg | ORAL_TABLET | Freq: Two times a day (BID) | ORAL | 2 refills | Status: DC | PRN

## 2017-12-12 MED ORDER — VENLAFAXINE HCL ER 150 MG PO CP24: 150.0000 mg | ORAL_CAPSULE | Freq: Every day | ORAL | 1 refills | Status: DC

## 2017-12-12 NOTE — Progress Notes (Signed)
Pre visit review using our clinic review tool, if applicable. No additional management support is needed unless otherwise documented below in the visit note. 

## 2017-12-12 NOTE — Progress Notes (Signed)
No chief complaint on file.  F/u 1. C/o hoarseness x > 1 month and not improving no fever and at the back of her tongue she has pain. Denies GERD sx's. She is a smoker 2. overweight wants to restart adipex  3. Not sch dermatology or rheumatology appts yet  4. C/o neck pain and back pain right sided chronic mobic and robaxin helped better than flexeril  5. Anxiety and depression better on venlafaxine 150 mg qd wants refill   Review of Systems  Constitutional: Negative for fever.  HENT: Negative for sore throat.        +hoarseness   Eyes: Negative for blurred vision.  Respiratory: Negative for cough.   Cardiovascular: Negative for chest pain.  Musculoskeletal: Negative for back pain.  Psychiatric/Behavioral: Negative for depression. The patient is not nervous/anxious.        Mood improved    Past Medical History:  Diagnosis Date  . Anemia    with PICA sx's   . Anxiety   . Bacterial vaginosis   . Chicken pox   . Depression   . HSV (herpes simplex virus) infection    Past Surgical History:  Procedure Laterality Date  . APPENDECTOMY     52 y.o.   . AUGMENTATION MAMMAPLASTY Bilateral   . COLONOSCOPY WITH PROPOFOL N/A 05/02/2017   Procedure: COLONOSCOPY WITH PROPOFOL;  Surgeon: Pasty Spillersahiliani, Varnita B, MD;  Location: [facility] ENDOSCOPY;  Service: Endoscopy;  Laterality: N/A;  . FRACTURE SURGERY     Left thumb   Family History  Problem Relation Age of Onset  . Arthritis Mother   . Cancer Mother        breast  . Hearing loss Mother   . Hypertension Mother   . Breast cancer Mother [id number]  . Arthritis Father   . Cancer Father        colon dx'ed age 5870s   . Hypertension Father   . Heart disease Father        MI  . Diabetes Father   . Mental illness Son        [facility]   Social History   Socioeconomic History  . Marital status: Divorced    Spouse name: Not on file  . Number of children: Not on file  . Years of education: Not on file  . Highest education level: Not on file   Occupational History  . Not on file  Social Needs  . Financial resource strain: Not on file  . Food insecurity:    Worry: Not on file    Inability: Not on file  . Transportation needs:    Medical: Not on file    Non-medical: Not on file  Tobacco Use  . Smoking status: Current Every Day Smoker  . Smokeless tobacco: Never Used  . Tobacco comment: <1ppd x 30 years no FH lung cancer   Substance and Sexual Activity  . Alcohol use: Yes    Comment: socially per pt   . Drug use: No  . Sexual activity: Yes    Comment: men  Lifestyle  . Physical activity:    Days per week: Not on file    Minutes per session: Not on file  . Stress: Not on file  Relationships  . Social connections:    Talks on phone: Not on file    Gets together: Not on file    Attends religious service: Not on file    Active member of club or organization: Not on file  Attends meetings of clubs or organizations: Not on file    Relationship status: Not on file  . Intimate partner violence:    Fear of current or ex partner: Not on file    Emotionally abused: Not on file    Physically abused: Not on file    Forced sexual activity: Not on file  Other Topics Concern  . 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 ALPRAZolam (XANAX) 0.5 MG tablet Take 1 tablet (0.5 mg total) by mouth daily as needed for anxiety.  . clobetasol (TEMOVATE) 0.05 % external solution Apply 1 application topically 2 (two) times daily as needed. Scalp  . cyclobenzaprine (FLEXERIL) 5 MG tablet Take 1 tablet (5 mg total) by mouth at bedtime as needed for muscle spasms.  . meloxicam (MOBIC) 15 MG tablet Take 1 tablet (15 mg total) by mouth daily.  . methocarbamol (ROBAXIN) 500 MG tablet Take 1 tablet (500 mg total) by mouth 4 (four) times daily.  Marland Kitchen oxyCODONE-acetaminophen  (PERCOCET) 5-325 MG tablet Take 1 tablet by mouth 2 (two) times daily as needed for severe pain.  . phentermine (ADIPEX-P) 37.5 MG tablet Take 1 tablet (37.5 mg total) by mouth daily before breakfast.  . predniSONE (DELTASONE) 20 MG tablet Take 1 tablet (20 mg total) by mouth daily with breakfast.  . triamcinolone cream (KENALOG) 0.1 % Bid elbows avoid face/groin/private  . venlafaxine XR (EFFEXOR-XR) 75 MG 24 hr capsule Take 1 capsule (75 mg total) by mouth daily with breakfast.  . zolpidem (AMBIEN) 5 MG tablet Take 1 tablet (5 mg total) by mouth at bedtime as needed for sleep.   Allergies  Allergen Reactions  . Penicillins Swelling    Angioedema  . Codeine Rash    Elevated heartrate   No results found for this or any previous visit (from the past 2160 hour(s)). Objective  Body mass index is 28.21 kg/m. Wt Readings from Last 3 Encounters:  12/12/17 169 lb 8 oz (76.9 kg)  12/06/17 159 lb (72.1 kg)  07/30/17 166 lb 9.6 oz (75.6 kg)   Temp Readings from Last 3 Encounters:  12/12/17 98.1 F (36.7 C) (Oral)  12/06/17 97.8 F (36.6 C) (Oral)  07/30/17 97.8 F (36.6 C) (Oral)   BP Readings from Last 3 Encounters:  12/12/17 130/64  12/06/17 132/77  07/30/17 126/68   Pulse Readings from Last 3 Encounters:  12/12/17 86  12/06/17 78  07/30/17 75    Physical Exam  Constitutional: She is oriented to person, place, and time. Vital signs are normal. She appears well-developed and well-nourished. She is cooperative.  HENT:  Head: Normocephalic and atraumatic.  Mouth/Throat: Oropharynx is clear and moist and mucous membranes are normal. No oropharyngeal exudate, posterior oropharyngeal edema, posterior oropharyngeal erythema or tonsillar abscesses.  Eyes: Pupils are equal, round, and reactive to light. Conjunctivae are normal.  Cardiovascular: Normal rate, regular rhythm and normal heart sounds.  Pulmonary/Chest: Effort normal and breath sounds normal.  Neurological: She is alert  and oriented to person, place, and time. Gait normal.  Skin: Skin is warm, dry and intact.  Psychiatric: She has a normal mood and affect. Her speech is normal and behavior is normal. Judgment and thought content normal. Cognition and  memory are normal.  Nursing note and vitals reviewed.   Assessment   1. Hoarseness in smoker  2. Anxiety and depression  3. 28.21 overweight 4. Muscle spasm and neck and chronic back right sided pain  5. HM Plan   1. Referred ENT Dr. Jenne Campus  2. Refilled Venlafaxine 150 mg qd prn Xanax  3. adipex x 2 months f/u in 2 months for another 2 months Rx  4.refilled robaxin stop flexeril not effective  5.  Flu will get at work  utd Tdap  Given hep B vaccine2/3, 3rd due 10/26/17   Pap today1/28/19 neg neg hpv +BV (see above) sx's resolved  mammo neg 07/08/17 neg  [facility]  rec smoking cessation Refer to dermatology today Dr. Garnette Gunner pending as well as rheumatology      Provider: Dr. French Ana McLean-Scocuzza-Internal Medicine

## 2017-12-12 NOTE — Patient Instructions (Addendum)
Referred to ENT Dr. Jenne CampusMcQueen   Laryngitis Laryngitis is inflammation of your vocal cords. This causes hoarseness, coughing, loss of voice, sore throat, or a dry throat. Your vocal cords are two bands of muscles that are found in your throat. When you speak, these cords come together and vibrate. These vibrations come out through your mouth as sound. When your vocal cords are inflamed, your voice sounds different. Laryngitis can be temporary (acute) or long-term (chronic). Most cases of acute laryngitis improve with time. Chronic laryngitis is laryngitis that lasts for more than three weeks. What are the causes? Acute laryngitis may be caused by:  A viral infection.  Lots of talking, yelling, or singing. This is also called vocal strain.  Bacterial infections.  Chronic laryngitis may be caused by:  Vocal strain.  Injury to your vocal cords.  Acid reflux (gastroesophageal reflux disease or GERD).  Allergies.  Sinus infection.  Smoking.  Alcohol abuse.  Breathing in chemicals or dust.  Growths on the vocal cords.  What increases the risk? Risk factors for laryngitis include:  Smoking.  Alcohol abuse.  Having allergies.  What are the signs or symptoms? Symptoms of laryngitis may include:  Low, hoarse voice.  Loss of voice.  Dry cough.  Sore throat.  Stuffy nose.  How is this diagnosed? Laryngitis may be diagnosed by:  Physical exam.  Throat culture.  Blood test.  Laryngoscopy. This procedure allows your health care provider to look at your vocal cords with a mirror or viewing tube.  How is this treated? Treatment for laryngitis depends on what is causing it. Usually, treatment involves resting your voice and using medicines to soothe your throat. However, if your laryngitis is caused by a bacterial infection, you may need to take antibiotic medicine. If your laryngitis is caused by a growth, you may need to have a procedure to remove it. Follow these  instructions at home:  Drink enough fluid to keep your urine clear or pale yellow.  Breathe in moist air. Use a humidifier if you live in a dry climate.  Take medicines only as directed by your health care provider.  If you were prescribed an antibiotic medicine, finish it all even if you start to feel better.  Do not smoke cigarettes or electronic cigarettes. If you need help quitting, ask your health care provider.  Talk as little as possible. Also avoid whispering, which can cause vocal strain.  Write instead of talking. Do this until your voice is back to normal. Contact a health care provider if:  You have a fever.  You have increasing pain.  You have difficulty swallowing. Get help right away if:  You cough up blood.  You have trouble breathing. This information is not intended to replace advice given to you by your health care provider. Make sure you discuss any questions you have with your health care provider. Document Released: 03/18/2005 Document Revised: 08/24/2015 Document Reviewed: 08/31/2013 Elsevier Interactive Patient Education  [employer]2018 Elsevier Inc.

## 2017-12-22 DIAGNOSIS — M47816 Spondylosis without myelopathy or radiculopathy, lumbar region: Secondary | ICD-10-CM | POA: Insufficient documentation

## 2018-01-09 ENCOUNTER — Ambulatory Visit: Payer: BLUE CROSS/BLUE SHIELD | Admitting: Internal Medicine

## 2018-01-15 DIAGNOSIS — R200 Anesthesia of skin: Secondary | ICD-10-CM | POA: Insufficient documentation

## 2018-01-15 DIAGNOSIS — R768 Other specified abnormal immunological findings in serum: Secondary | ICD-10-CM | POA: Insufficient documentation

## 2018-01-21 ENCOUNTER — Other Ambulatory Visit: Payer: Self-pay | Admitting: Internal Medicine

## 2018-01-21 ENCOUNTER — Telehealth: Payer: Self-pay | Admitting: Internal Medicine

## 2018-01-21 DIAGNOSIS — F419 Anxiety disorder, unspecified: Secondary | ICD-10-CM

## 2018-01-21 DIAGNOSIS — G4700 Insomnia, unspecified: Secondary | ICD-10-CM

## 2018-01-21 MED ORDER — ZOLPIDEM TARTRATE 5 MG PO TABS: 5.0000 mg | ORAL_TABLET | Freq: Every evening | ORAL | 2 refills | Status: DC | PRN

## 2018-01-21 NOTE — Telephone Encounter (Signed)
Copied from CRM [id number]. Topic: Quick Communication - See Telephone Encounter >> Jan 21, 2018  4:39 PM Lorrine Kin, NT wrote: CRM for notification. See Telephone encounter for: 01/21/18. Patient calling and states that she would like to change back to the zolpidem (AMBIEN) 5 MG tablet , instead of taking the lunesta. States that the [place] is not working. Please advise. [id number] - [place] ([place]), [place] - [address]

## 2018-01-21 NOTE — Telephone Encounter (Signed)
Sent ambien 5 mg qhs prn    TMS

## 2018-02-11 ENCOUNTER — Encounter

## 2018-02-11 ENCOUNTER — Ambulatory Visit: Payer: BLUE CROSS/BLUE SHIELD | Admitting: Internal Medicine

## 2018-02-23 ENCOUNTER — Telehealth: Payer: Self-pay | Admitting: Internal Medicine

## 2018-02-23 ENCOUNTER — Other Ambulatory Visit: Payer: Self-pay | Admitting: Internal Medicine

## 2018-02-23 DIAGNOSIS — F419 Anxiety disorder, unspecified: Secondary | ICD-10-CM

## 2018-02-23 NOTE — Telephone Encounter (Signed)
Pt should not be out of ambien call the pharmacy and call Remus Lofflerambien  Should not run out until [employer]

## 2018-02-23 NOTE — Telephone Encounter (Signed)
Copied from CRM [id number]. Topic: Quick Communication - Rx Refill/Question >> Feb 23, 2018  2:46 PM Jens SomMedley, Jennifer A wrote: Medication: zolpidem (AMBIEN) 5 MG tablet [[id number]][[id number]]   Has the patient contacted their pharmacy? Yes  (Agent: If no, request that the patient contact the pharmacy for the refill.) (Agent: If yes, when and what did the pharmacy advise?)  Preferred Pharmacy (with phone number or street name):[facility] [address] - [place] ([place]), [place] - [address] [place] ([place]) [place] [address] Phone: [phone] Fax: [phone]    Agent: Please be advised that RX refills may take up to 3 business days. We ask that you follow-up with your pharmacy.

## 2018-02-23 NOTE — Telephone Encounter (Signed)
Copied from CRM [id number]. Topic: Quick Communication - Rx Refill/Question >> Feb 23, 2018  9:57 AM Arlyss Gandyichardson, Natacia Chaisson N, NT wrote: Medication: ALPRAZolam Prudy Feeler(XANAX) 0.5 MG tablet  Has the patient contacted their pharmacy? Yes.   (Agent: If no, request that the patient contact the pharmacy for the refill.) (Agent: If yes, when and what did the pharmacy advise?) Already used refills   Preferred Pharmacy (with phone number or street name): [facility] [address] - [place] ([place]), [place] - [phone] (Phone) [phone] (Fax)    Agent: Please be advised that RX refills may take up to 3 business days. We ask that you follow-up with your pharmacy.

## 2018-02-24 ENCOUNTER — Other Ambulatory Visit: Payer: Self-pay | Admitting: Internal Medicine

## 2018-02-24 DIAGNOSIS — F419 Anxiety disorder, unspecified: Secondary | ICD-10-CM

## 2018-02-24 MED ORDER — ALPRAZOLAM 0.5 MG PO TABS: 0.5000 mg | ORAL_TABLET | Freq: Every day | ORAL | 2 refills | Status: DC | PRN

## 2018-02-24 NOTE — Telephone Encounter (Signed)
Requested medication (s) are due for refill today: yes  Requested medication (s) are on the active medication list: yes    Last refill: 11/26/17  Future visit scheduled yes 03/06/18  Notes to clinic:not delegated  Requested Prescriptions  Pending Prescriptions Disp Refills   ALPRAZolam (XANAX) 0.5 MG tablet 30 tablet 2    Sig: Take 1 tablet (0.5 mg total) by mouth daily as needed for anxiety.     Not Delegated - Psychiatry:  Anxiolytics/Hypnotics Failed - 02/23/2018  2:34 PM      Failed - This refill cannot be delegated      Failed - Urine Drug Screen completed in last 360 days.      Passed - Valid encounter within last 6 months    Recent Outpatient Visits          2 months ago Hoarseness of voice   [facility] McLean-Scocuzza, Pasty Spillersracy N, MD   6 months ago Chronic low back pain without sciatica, unspecified back pain laterality   [facility] [place] McLean-Scocuzza, Pasty Spillersracy N, MD   8 months ago Rash   [facility] [place] McLean-Scocuzza, Pasty Spillersracy N, MD   10 months ago Anxiety   [facility] [place] McLean-Scocuzza, Pasty Spillersracy N, MD   11 months ago Anxiety    [facility] [place] McLean-Scocuzza, Pasty Spillersracy N, MD      Future Appointments            In 1 week McLean-Scocuzza, Pasty Spillersracy N, MD [facility] [place] [place] [facility]

## 2018-02-25 NOTE — Telephone Encounter (Signed)
Left message for patient to return call back. [facility] may give and obtain information.  

## 2018-03-06 ENCOUNTER — Ambulatory Visit (INDEPENDENT_AMBULATORY_CARE_PROVIDER_SITE_OTHER): Payer: BLUE CROSS/BLUE SHIELD | Admitting: Internal Medicine

## 2018-03-06 ENCOUNTER — Encounter: Payer: Self-pay | Admitting: Internal Medicine

## 2018-03-06 VITALS — BP 126/78 | HR 81 | Temp 98.2°F | Ht 65.0 in | Wt 166.8 lb

## 2018-03-06 DIAGNOSIS — E663 Overweight: Secondary | ICD-10-CM

## 2018-03-06 DIAGNOSIS — M62838 Other muscle spasm: Secondary | ICD-10-CM | POA: Diagnosis not present

## 2018-03-06 DIAGNOSIS — M47816 Spondylosis without myelopathy or radiculopathy, lumbar region: Secondary | ICD-10-CM

## 2018-03-06 DIAGNOSIS — R490 Dysphonia: Secondary | ICD-10-CM

## 2018-03-06 DIAGNOSIS — F419 Anxiety disorder, unspecified: Secondary | ICD-10-CM

## 2018-03-06 DIAGNOSIS — G4700 Insomnia, unspecified: Secondary | ICD-10-CM

## 2018-03-06 DIAGNOSIS — L4050 Arthropathic psoriasis, unspecified: Secondary | ICD-10-CM | POA: Diagnosis not present

## 2018-03-06 MED ORDER — PHENTERMINE HCL 37.5 MG PO TABS: 37.5000 mg | ORAL_TABLET | Freq: Every day | ORAL | 0 refills | Status: DC

## 2018-03-06 MED ORDER — ZOLPIDEM TARTRATE ER 6.25 MG PO TBCR: 6.2500 mg | EXTENDED_RELEASE_TABLET | Freq: Every evening | ORAL | 2 refills | Status: DC | PRN

## 2018-03-06 MED ORDER — METHOCARBAMOL 500 MG PO TABS: 500.0000 mg | ORAL_TABLET | Freq: Two times a day (BID) | ORAL | 2 refills | Status: DC | PRN

## 2018-03-06 NOTE — Progress Notes (Signed)
Chief Complaint  Patient presents with  . Follow-up   F/u  1. Seeing Dr. Renard MatterBock for lumbar spondylosis and psoriatic arthritis given diclofenac 50 mg bid which helps  2. Insomnia she was taking ambien 5 mg x 2 pills not written like that and ran out of pills  3. Anxiety/insomnia CBC discharged her due to missed appt which she called to cancel she needs new psychiatrist will refer to Dr. Maryruth BunKapur  4. Hoarseness has not seen ENT yet due to schedule and money referral placed she is still smoking < 1ppd    Review of Systems  Constitutional: Positive for weight loss.  HENT: Negative for hearing loss.   Eyes: Negative for blurred vision.  Respiratory: Negative for shortness of breath.   Cardiovascular: Negative for chest pain.  Gastrointestinal: Negative for abdominal pain.  Musculoskeletal: Positive for back pain.  Skin: Negative for rash.  Neurological: Negative for headaches.  Psychiatric/Behavioral: The patient is nervous/anxious and has insomnia.    Past Medical History:  Diagnosis Date  . Anemia    with PICA sx's   . Anxiety   . Bacterial vaginosis   . Chicken pox   . Depression   . HSV (herpes simplex virus) infection    Past Surgical History:  Procedure Laterality Date  . APPENDECTOMY     52 y.o.   . AUGMENTATION MAMMAPLASTY Bilateral   . COLONOSCOPY WITH PROPOFOL N/A 05/02/2017   Procedure: COLONOSCOPY WITH PROPOFOL;  Surgeon: Pasty Spillersahiliani, Varnita B, MD;  Location: [facility] ENDOSCOPY;  Service: Endoscopy;  Laterality: N/A;  . FRACTURE SURGERY     Left thumb   Family History  Problem Relation Age of Onset  . Arthritis Mother   . Cancer Mother        breast  . Hearing loss Mother   . Hypertension Mother   . Breast cancer Mother 338  . Arthritis Father   . Cancer Father        colon dx'ed age 3570s   . Hypertension Father   . Heart disease Father        MI  . Diabetes Father   . Mental illness Son        [facility]   Social History   Socioeconomic History  . Marital status:  Divorced    Spouse name: Not on file  . Number of children: Not on file  . Years of education: Not on file  . Highest education level: Not on file  Occupational History  . Not on file  Social Needs  . Financial resource strain: Not on file  . Food insecurity:    Worry: Not on file    Inability: Not on file  . Transportation needs:    Medical: Not on file    Non-medical: Not on file  Tobacco Use  . Smoking status: Current Every Day Smoker  . Smokeless tobacco: Never Used  . Tobacco comment: <1ppd x 30 years no FH lung cancer   Substance and Sexual Activity  . Alcohol use: Yes    Comment: socially per pt   . Drug use: No  . Sexual activity: Yes    Comment: men  Lifestyle  . Physical activity:    Days per week: Not on file    Minutes per session: Not on file  . Stress: Not on file  Relationships  . Social connections:    Talks on phone: Not on file    Gets together: Not on file    Attends religious service: Not  on file    Active member of club or organization: Not on file    Attends meetings of clubs or organizations: Not on file    Relationship status: Not on file  . Intimate partner violence:    Fear of current or ex partner: Not on file    Emotionally abused: Not on file    Physically abused: Not on file    Forced sexual activity: Not on file  Other Topics Concern  . 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 ALPRAZolam (XANAX) 0.5 MG tablet Take 1 tablet (0.5 mg total) by mouth daily as needed for anxiety.  . clobetasol (TEMOVATE) 0.05 % external solution Apply 1 application topically 2 (two) times daily as needed. Scalp  . diclofenac (CATAFLAM) 50 MG tablet Take 50 mg by mouth 2 (two) times daily as needed.  . meloxicam (MOBIC) 15 MG tablet Take 1 tablet (15 mg total) by mouth daily.  .  phentermine (ADIPEX-P) 37.5 MG tablet Take 1 tablet (37.5 mg total) by mouth daily before breakfast.  . triamcinolone cream (KENALOG) 0.1 % Bid elbows avoid face/groin/private  . venlafaxine XR (EFFEXOR-XR) 150 MG 24 hr capsule Take 1 capsule (150 mg total) by mouth daily with breakfast.  . [DISCONTINUED] methocarbamol (ROBAXIN) 500 MG tablet Take 1 tablet (500 mg total) by mouth 2 (two) times daily as needed for muscle spasms.  . [DISCONTINUED] phentermine (ADIPEX-P) 37.5 MG tablet Take 1 tablet (37.5 mg total) by mouth daily before breakfast.   Allergies  Allergen Reactions  . Penicillins Swelling    Angioedema  . Codeine Rash    Elevated heartrate   No results found for this or any previous visit (from the past 2160 hour(s)). Objective  Body mass index is 27.76 kg/m. Wt Readings from Last 3 Encounters:  03/06/18 166 lb 12.8 oz (75.7 kg)  12/12/17 169 lb 8 oz (76.9 kg)  12/06/17 159 lb (72.1 kg)   Temp Readings from Last 3 Encounters:  03/06/18 98.2 F (36.8 C) (Oral)  12/12/17 98.1 F (36.7 C) (Oral)  12/06/17 97.8 F (36.6 C) (Oral)   BP Readings from Last 3 Encounters:  03/06/18 126/78  12/12/17 130/64  12/06/17 132/77   Pulse Readings from Last 3 Encounters:  03/06/18 81  12/12/17 86  12/06/17 78    Physical Exam  Constitutional: She is oriented to person, place, and time. Vital signs are normal. She appears well-developed and well-nourished. She is cooperative.  HENT:  Head: Normocephalic and atraumatic.  Mouth/Throat: Oropharynx is clear and moist and mucous membranes are normal.  Eyes: Pupils are equal, round, and reactive to light. Conjunctivae are normal.  Cardiovascular: Normal rate, regular rhythm and normal heart sounds.  Pulmonary/Chest: Effort normal and breath sounds normal.  Neurological: She is alert and oriented to person, place, and time. Gait normal.  Skin: Skin is warm, dry and intact.  Psychiatric: She has a normal mood and affect. Her speech  is normal and behavior is normal. Judgment and thought content normal. Cognition and memory are normal.  Nursing note and vitals reviewed.   Assessment   1. Psoriatic arthritis and lumbar spondylosis  2. Insomnia/anxiety  3. Hoarseness  4. HM Plan   1. F/u  with Dr. Renard Matter on Diclofenac 50 mg bid helping  Will refill meloxicam prn  2. Refer to Dr. Maryruth Bun  Change ambien 5 mg qhs to 6.25 cr  3. Has not seen ENT yet  4.  Flu had at work 12/30/17 utd Tdap  Had 3/3 hep B vaccines  Pap today1/28/19 neg neg hpv +BV mammo neg 07/08/17 neg Colonoscopyhad 05/02/17 [facility] Refer to dermatology today Dr. Roxan Diesel yet been  Seeing rheumatology Dr. Renard Matter  Check fasting labs given [facility] form lipid, A1C, TSH, UA, vit D, urine culture h/o hematuria    Provider: Dr. French Ana McLean-Scocuzza-[facility]

## 2018-03-06 NOTE — Patient Instructions (Signed)
Dr. Maryruth BunKapur psychiatry [phone] [address] [facility]   Insomnia Insomnia is a sleep disorder that makes it difficult to fall asleep or to stay asleep. Insomnia can cause tiredness (fatigue), low energy, difficulty concentrating, mood swings, and poor performance at work or school. There are three different ways to classify insomnia:  Difficulty falling asleep.  Difficulty staying asleep.  Waking up too early in the morning.  Any type of insomnia can be long-term (chronic) or short-term (acute). Both are common. Short-term insomnia usually lasts for three months or less. Chronic insomnia occurs at least three times a week for longer than three months. What are the causes? Insomnia may be caused by another condition, situation, or substance, such as:  Anxiety.  Certain medicines.  Gastroesophageal reflux disease (GERD) or other gastrointestinal conditions.  Asthma or other breathing conditions.  Restless legs syndrome, sleep apnea, or other sleep disorders.  Chronic pain.  Menopause. This may include hot flashes.  Stroke.  Abuse of alcohol, tobacco, or illegal drugs.  Depression.  Caffeine.  Neurological disorders, such as Alzheimer disease.  An overactive thyroid (hyperthyroidism).  The cause of insomnia may not be known. What increases the risk? Risk factors for insomnia include:  Gender. Women are more commonly affected than men.  Age. Insomnia is more common as you get older.  Stress. This may involve your professional or personal life.  Income. Insomnia is more common in people with lower income.  Lack of exercise.  Irregular work schedule or night shifts.  Traveling between different time zones.  What are the signs or symptoms? If you have insomnia, trouble falling asleep or trouble staying asleep is the main symptom. This may lead to other symptoms, such as:  Feeling fatigued.  Feeling nervous about going to sleep.  Not  feeling rested in the morning.  Having trouble concentrating.  Feeling irritable, anxious, or depressed.  How is this treated? Treatment for insomnia depends on the cause. If your insomnia is caused by an underlying condition, treatment will focus on addressing the condition. Treatment may also include:  Medicines to help you sleep.  Counseling or therapy.  Lifestyle adjustments.  Follow these instructions at home:  Take medicines only as directed by your health care provider.  Keep regular sleeping and waking hours. Avoid naps.  Keep a sleep diary to help you and your health care provider figure out what could be causing your insomnia. Include: ? When you sleep. ? When you wake up during the night. ? How well you sleep. ? How rested you feel the next day. ? Any side effects of medicines you are taking. ? What you eat and drink.  Make your bedroom a comfortable place where it is easy to fall asleep: ? Put up shades or special blackout curtains to block light from outside. ? Use a white noise machine to block noise. ? Keep the temperature cool.  Exercise regularly as directed by your health care provider. Avoid exercising right before bedtime.  Use relaxation techniques to manage stress. Ask your health care provider to suggest some techniques that may work well for you. These may include: ? Breathing exercises. ? Routines to release muscle tension. ? Visualizing peaceful scenes.  Cut back on alcohol, caffeinated beverages, and cigarettes, especially close to bedtime. These can disrupt your sleep.  Do not overeat or eat spicy foods right before bedtime. This can lead to digestive discomfort that can make it hard for you to sleep.  Limit screen  use before bedtime. This includes: ? Watching TV. ? Using your smartphone, tablet, and computer.  Stick to a routine. This can help you fall asleep faster. Try to do a quiet activity, brush your teeth, and go to bed at the same  time each night.  Get out of bed if you are still awake after 15 minutes of trying to sleep. Keep the lights down, but try reading or doing a quiet activity. When you feel sleepy, go back to bed.  Make sure that you drive carefully. Avoid driving if you feel very sleepy.  Keep all follow-up appointments as directed by your health care provider. This is important. Contact a health care provider if:  You are tired throughout the day or have trouble in your daily routine due to sleepiness.  You continue to have sleep problems or your sleep problems get worse. Get help right away if:  You have serious thoughts about hurting yourself or someone else. This information is not intended to replace advice given to you by your health care provider. Make sure you discuss any questions you have with your health care provider. Document Released: 03/15/2000 Document Revised: 08/18/2015 Document Reviewed: 12/17/2013 Elsevier Interactive Patient Education  [employer].

## 2018-03-16 ENCOUNTER — Ambulatory Visit (INDEPENDENT_AMBULATORY_CARE_PROVIDER_SITE_OTHER): Payer: BLUE CROSS/BLUE SHIELD | Admitting: Family Medicine

## 2018-03-16 ENCOUNTER — Encounter: Payer: Self-pay | Admitting: Family Medicine

## 2018-03-16 VITALS — BP 144/86 | HR 89 | Temp 98.0°F | Ht 65.0 in | Wt 162.0 lb

## 2018-03-16 DIAGNOSIS — J3489 Other specified disorders of nose and nasal sinuses: Secondary | ICD-10-CM | POA: Diagnosis not present

## 2018-03-16 DIAGNOSIS — J019 Acute sinusitis, unspecified: Secondary | ICD-10-CM

## 2018-03-16 DIAGNOSIS — H5789 Other specified disorders of eye and adnexa: Secondary | ICD-10-CM | POA: Diagnosis not present

## 2018-03-16 MED ORDER — DOXYCYCLINE HYCLATE 100 MG PO TABS: 100.0000 mg | ORAL_TABLET | Freq: Two times a day (BID) | ORAL | 0 refills | Status: DC

## 2018-03-16 MED ORDER — METHYLPREDNISOLONE 4 MG PO TBPK: ORAL_TABLET | ORAL | 0 refills | Status: DC

## 2018-03-16 NOTE — Progress Notes (Signed)
Subjective:    Patient ID: Shelly Stevenson, female    DOB: 08-May-1965, 52 y.o.   MRN: [id number]  HPI  Presents to clinic c/o swollen eyes, pressure in face, nasal drainge getting worse over the past week.  Patient states the puffiness around her eyes is the worst is had been today. Denies any vision changes. No drainage or discharge from eyes.   Denies fever or chills. Denies Nausea, vomiting or diarrhea. Denies cough, sob or wheezes.   Patient Active Problem List   Diagnosis Date Noted  . Psoriatic arthritis (HCC) 03/06/2018  . Overweight (BMI 25.0-29.9) 03/06/2018  . Hoarseness of voice 12/12/2017  . Low back pain 07/30/2017  . HLD (hyperlipidemia) 06/13/2017  . Bacterial vaginosis 06/13/2017  . Obesity (BMI 30-39.9) 06/13/2017  . Rash 06/13/2017  . Special screening for malignant neoplasms, colon   . Positive ANA (antinuclear antibody) 04/28/2017  . Anxiety 03/24/2017  . Depression, recurrent (HCC) 03/24/2017  . Menopause 03/24/2017  . Insomnia 03/24/2017  . Psoriasis 03/24/2017  . Arthralgia 03/24/2017  . Hot flashes 03/24/2017  . Anemia 03/24/2017  . Bilateral carpal tunnel syndrome 03/24/2017   Social History   Tobacco Use  . Smoking status: Current Every Day Smoker  . Smokeless tobacco: Never Used  . Tobacco comment: <1ppd x 30 years no FH lung cancer   Substance Use Topics  . Alcohol use: Yes    Comment: socially per pt    Review of Systems   Constitutional: Negative for chills, fatigue and fever.  HENT: +congestion, ear pain, sinus pain/pressure.   Eyes: +swollen eyes   Respiratory: Negative for cough, shortness of breath and wheezing.   Cardiovascular: Negative for chest pain, palpitations and leg swelling.  Gastrointestinal: Negative for abdominal pain, diarrhea, nausea and vomiting.  Genitourinary: Negative for dysuria, frequency and urgency.  Musculoskeletal: Negative for arthralgias and myalgias.  Skin: Negative for color change, pallor and rash.    Neurological: Negative for syncope, light-headedness and headaches.  Psychiatric/Behavioral: The patient is not nervous/anxious.       Objective:   Physical Exam Vitals signs and nursing note reviewed.  Constitutional:      General: She is not in acute distress.    Appearance: She is not toxic-appearing.  HENT:     Head: Normocephalic and atraumatic.      Comments: +puffiness underneath bilateral eyes. +fullness bilateral TMs    Nose: Nasal tenderness, mucosal edema and rhinorrhea present.     Right Sinus: Maxillary sinus tenderness and frontal sinus tenderness present.     Left Sinus: Maxillary sinus tenderness and frontal sinus tenderness present.  Eyes:     General: No scleral icterus.       Right eye: No discharge.        Left eye: No discharge.     Extraocular Movements: Extraocular movements intact.     Conjunctiva/sclera: Conjunctivae normal.  Neurological:     Mental Status: She is alert.       Today's Vitals   03/16/18 1417 03/16/18 1426  BP: (!) 158/90 (!) 144/86  Pulse: 89   Temp: 98 F (36.7 C)   TempSrc: Oral   SpO2: 95%   Weight: 162 lb (73.5 kg)   Height: 5\' 5"  (1.651 m)    Body mass index is 26.96 kg/m.  Assessment & Plan:   Sinusitis, swelling under eyes, sinus pain-I suspect sinus pain, puffiness around eyes is related to a sinus infection.  Patient is penicillin allergic so she will take doxycycline  twice daily for 10 days.  She will also take steroid taper to help improve swelling.  Advised to rest, increase fluids and do good handwashing.  Also suggested she can try a warm compress on her face to help improve swelling and sinus pain.  Keep regularly scheduled follow-up PCP as planned, return to clinic sooner if any issues arise.

## 2018-03-16 NOTE — Patient Instructions (Signed)
Try warm compress on face to help improve swelling

## 2018-03-17 ENCOUNTER — Telehealth: Payer: Self-pay | Admitting: *Deleted

## 2018-03-17 NOTE — Telephone Encounter (Signed)
Re- printed letter placed at front desk for patient pick up.

## 2018-03-17 NOTE — Telephone Encounter (Signed)
Copied from CRM [id number]. Topic: General - Other >> Mar 16, 2018  5:30 PM Marylen PontoMcneil, Ja-Kwan wrote: Reason for CRM: Pt stated she misplaced the doctor note so she will stop by the office to get another note.

## 2018-03-19 ENCOUNTER — Other Ambulatory Visit: Payer: Self-pay | Admitting: Internal Medicine

## 2018-03-19 DIAGNOSIS — Z780 Asymptomatic menopausal state: Secondary | ICD-10-CM

## 2018-03-19 DIAGNOSIS — F339 Major depressive disorder, recurrent, unspecified: Secondary | ICD-10-CM

## 2018-03-19 DIAGNOSIS — F419 Anxiety disorder, unspecified: Secondary | ICD-10-CM

## 2018-03-19 MED ORDER — VENLAFAXINE HCL ER 150 MG PO CP24: 150.0000 mg | ORAL_CAPSULE | Freq: Every day | ORAL | 3 refills | Status: DC

## 2018-04-08 ENCOUNTER — Telehealth: Payer: Self-pay | Admitting: *Deleted

## 2018-04-08 NOTE — Telephone Encounter (Signed)
Copied from CRM [id number]. Topic: Referral - Status >> Apr 08, 2018  4:55 PM Trula SladeWalter, Linda F wrote: Reason for CRM:   Patient is checking on the status of her Psychiatry referral.  She has not heard anything and a month has gone by

## 2018-04-09 NOTE — Telephone Encounter (Signed)
Call pt back please re referral   TMS

## 2018-04-10 NOTE — Telephone Encounter (Signed)
She is scheduled on 2/25 with Dr. Maryruth Bun.

## 2018-04-27 ENCOUNTER — Telehealth: Payer: Self-pay | Admitting: Internal Medicine

## 2018-04-27 NOTE — Telephone Encounter (Signed)
Spoken to patient. Her bottle says the same thing but her pharmacy is saying no. shes going to call her [pharmacy again to get clarification.

## 2018-04-27 NOTE — Telephone Encounter (Signed)
Call pt she should not be out of xanax until the end of feb this is too early for refill   TMS

## 2018-04-27 NOTE — Telephone Encounter (Signed)
She should have 1 more filled end of 01/2018 so should be good until end of 05/2018   TMS

## 2018-04-27 NOTE — Telephone Encounter (Signed)
Copied from CRM [id number]. Topic: Quick Communication - Rx Refill/Question >> Apr 27, 2018  3:14 PM Jens Som A wrote: Medication: ALPRAZolam Prudy Feeler) 0.5 MG tablet [[id number]] -Apt Schedule 05/07/18  Has the patient contacted their pharmacy? Yes  (Agent: If no, request that the patient contact the pharmacy for the refill.) (Agent: If yes, when and what did the pharmacy advise?)  Preferred Pharmacy (with phone number or street name): [facility] [address]), [place] - [address] [phone] (Phone) [phone] (Fax)    Agent: Please be advised that RX refills may take up to 3 business days. We ask that you follow-up with your pharmacy.

## 2018-05-07 ENCOUNTER — Ambulatory Visit: Payer: BLUE CROSS/BLUE SHIELD | Admitting: Internal Medicine

## 2018-05-12 ENCOUNTER — Encounter: Payer: Self-pay | Admitting: Internal Medicine

## 2018-05-17 ENCOUNTER — Other Ambulatory Visit: Payer: Self-pay | Admitting: Internal Medicine

## 2018-05-17 ENCOUNTER — Telehealth: Payer: Self-pay | Admitting: Internal Medicine

## 2018-05-17 ENCOUNTER — Encounter: Payer: Self-pay | Admitting: Internal Medicine

## 2018-05-17 DIAGNOSIS — E559 Vitamin D deficiency, unspecified: Secondary | ICD-10-CM

## 2018-05-17 MED ORDER — CHOLECALCIFEROL 1.25 MG (50000 UT) PO CAPS: 50000.0000 [IU] | ORAL_CAPSULE | ORAL | 1 refills | Status: DC

## 2018-05-17 NOTE — Telephone Encounter (Signed)
Labs 05/12/2018  Liver kidneys normal  Alkaline phosphatase slightly elevated 231 normal is 226  -we need to continue to monitor this  Cholesterol elevated 207 total, TGs elevated 171 elevated, HDL 74, LDL 99 Thyroid labs normal  Blood cts normal  A1C 5.4 not prediabetic  Vitamin D 16.0 low we need to put her on high dose vitamin D  -pick up Rx from pharmacy   B12 404 normal   TMS

## 2018-05-18 NOTE — Telephone Encounter (Signed)
Patient was informed of results.  Patient understood and no questions, comments, or concerns at this time.  

## 2018-05-21 ENCOUNTER — Encounter: Payer: Self-pay | Admitting: Internal Medicine

## 2018-05-21 ENCOUNTER — Ambulatory Visit: Payer: BLUE CROSS/BLUE SHIELD | Admitting: Internal Medicine

## 2018-05-21 VITALS — BP 136/80 | HR 74 | Temp 97.3°F | Ht 65.0 in | Wt 166.6 lb

## 2018-05-21 DIAGNOSIS — F419 Anxiety disorder, unspecified: Secondary | ICD-10-CM | POA: Diagnosis not present

## 2018-05-21 DIAGNOSIS — E785 Hyperlipidemia, unspecified: Secondary | ICD-10-CM

## 2018-05-21 DIAGNOSIS — G4700 Insomnia, unspecified: Secondary | ICD-10-CM

## 2018-05-21 DIAGNOSIS — L409 Psoriasis, unspecified: Secondary | ICD-10-CM

## 2018-05-21 DIAGNOSIS — M62838 Other muscle spasm: Secondary | ICD-10-CM

## 2018-05-21 DIAGNOSIS — M542 Cervicalgia: Secondary | ICD-10-CM | POA: Diagnosis not present

## 2018-05-21 DIAGNOSIS — Z1231 Encounter for screening mammogram for malignant neoplasm of breast: Secondary | ICD-10-CM

## 2018-05-21 DIAGNOSIS — E559 Vitamin D deficiency, unspecified: Secondary | ICD-10-CM | POA: Insufficient documentation

## 2018-05-21 DIAGNOSIS — R748 Abnormal levels of other serum enzymes: Secondary | ICD-10-CM

## 2018-05-21 DIAGNOSIS — M7702 Medial epicondylitis, left elbow: Secondary | ICD-10-CM | POA: Insufficient documentation

## 2018-05-21 MED ORDER — ALPRAZOLAM 0.5 MG PO TABS: 0.5000 mg | ORAL_TABLET | Freq: Every day | ORAL | 5 refills | Status: DC | PRN

## 2018-05-21 MED ORDER — METHOCARBAMOL 500 MG PO TABS: 500.0000 mg | ORAL_TABLET | Freq: Two times a day (BID) | ORAL | 5 refills | Status: DC | PRN

## 2018-05-21 MED ORDER — CLOBETASOL PROPIONATE 0.05 % EX CREA: 1.0000 "application " | TOPICAL_CREAM | Freq: Two times a day (BID) | CUTANEOUS | 2 refills | Status: DC

## 2018-05-21 MED ORDER — ZOLPIDEM TARTRATE ER 6.25 MG PO TBCR: 6.2500 mg | EXTENDED_RELEASE_TABLET | Freq: Every evening | ORAL | 5 refills | Status: DC | PRN

## 2018-05-21 MED ORDER — DICLOFENAC POTASSIUM 50 MG PO TABS: 50.0000 mg | ORAL_TABLET | Freq: Two times a day (BID) | ORAL | 1 refills | Status: DC | PRN

## 2018-05-21 NOTE — Progress Notes (Signed)
Chief Complaint  Patient presents with  . Follow-up    medication   F/u  1. Anxiety/insomnia controlled on current medications venlafaxine 150 mg qd, prn xanax 0.5 mg qd prn she needs refill  2. Psoriasis b/l elbows she wants stronger steroid cream [facility] oint does not like as much. She saw Dr. Meda Coffee today and had steroid injection into left elbow which helped with pain she would like refill of diclofenac 50 mg bid prn but is not taking it with mobic  3. Reviewed labs 05/12/18 elevated alkaline phos she declines [place] for now and she denies she is drinking alcohol in excess 4. HLD disc healthy diet and exercise    Review of Systems  Constitutional: Negative for weight loss.  HENT: Negative for hearing loss.   Eyes: Negative for blurred vision.  Respiratory: Negative for shortness of breath.   Cardiovascular: Negative for chest pain.  Gastrointestinal: Negative for abdominal pain.  Musculoskeletal: Negative for falls.  Skin: Positive for rash.  Neurological: Negative for headaches.  Psychiatric/Behavioral: The patient is not nervous/anxious and does not have insomnia.    Past Medical History:  Diagnosis Date  . Anemia    with PICA sx's   . Anxiety   . Bacterial vaginosis   . Chicken pox   . Depression   . HSV (herpes simplex virus) infection   . Vitamin D deficiency    Past Surgical History:  Procedure Laterality Date  . APPENDECTOMY     53 y.o.   . AUGMENTATION MAMMAPLASTY Bilateral   . COLONOSCOPY WITH PROPOFOL N/A 05/02/2017   Procedure: COLONOSCOPY WITH PROPOFOL;  Surgeon: Virgel Manifold, MD;  Location: [facility] ENDOSCOPY;  Service: Endoscopy;  Laterality: N/A;  . FRACTURE SURGERY     Left thumb   Family History  Problem Relation Age of Onset  . Arthritis Mother   . Cancer Mother        breast  . Hearing loss Mother   . Hypertension Mother   . Breast cancer Mother 64  . Arthritis Father   . Cancer Father        colon dx'ed age 23s   . Hypertension Father   . Heart  disease Father        MI  . Diabetes Father   . Mental illness Son        [facility]   Social History   Socioeconomic History  . Marital status: Divorced    Spouse name: Not on file  . Number of children: Not on file  . Years of education: Not on file  . Highest education level: Not on file  Occupational History  . Not on file  Social Needs  . Financial resource strain: Not on file  . Food insecurity:    Worry: Not on file    Inability: Not on file  . Transportation needs:    Medical: Not on file    Non-medical: Not on file  Tobacco Use  . Smoking status: Current Every Day Smoker  . Smokeless tobacco: Never Used  . Tobacco comment: <1ppd x 30 years no FH lung cancer   Substance and Sexual Activity  . Alcohol use: Yes    Comment: socially per pt   . Drug use: No  . Sexual activity: Yes    Comment: men  Lifestyle  . Physical activity:    Days per week: Not on file    Minutes per session: Not on file  . Stress: Not on file  Relationships  .  Social connections:    Talks on phone: Not on file    Gets together: Not on file    Attends religious service: Not on file    Active member of club or organization: Not on file    Attends meetings of clubs or organizations: Not on file    Relationship status: Not on file  . Intimate partner violence:    Fear of current or ex partner: Not on file    Emotionally abused: Not on file    Physically abused: Not on file    Forced sexual activity: Not on file  Other Topics Concern  . 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 ALPRAZolam (XANAX) 0.5 MG tablet Take 1 tablet (0.5 mg total) by mouth daily as needed for anxiety.  . Cholecalciferol 1.25 MG (50000 UT) capsule Take 1 capsule (50,000 Units total) by mouth once a week.  . clobetasol (TEMOVATE) 0.05 % external  solution Apply 1 application topically 2 (two) times daily as needed. Scalp  . diclofenac (CATAFLAM) 50 MG tablet Take 50 mg by mouth 2 (two) times daily as needed.  . meloxicam (MOBIC) 15 MG tablet Take 1 tablet (15 mg total) by mouth daily.  . methocarbamol (ROBAXIN) 500 MG tablet Take 1 tablet (500 mg total) by mouth 2 (two) times daily as needed for muscle spasms.  . phentermine (ADIPEX-P) 37.5 MG tablet Take 1 tablet (37.5 mg total) by mouth daily before breakfast.  . triamcinolone cream (KENALOG) 0.1 % Bid elbows avoid face/groin/private  . venlafaxine XR (EFFEXOR-XR) 150 MG 24 hr capsule Take 1 capsule (150 mg total) by mouth daily with breakfast.  . zolpidem (AMBIEN CR) 6.25 MG CR tablet Take 1 tablet (6.25 mg total) by mouth at bedtime as needed for sleep.   Allergies  Allergen Reactions  . Penicillins Swelling    Angioedema  . Codeine Rash    Elevated heartrate   No results found for this or any previous visit (from the past 2160 hour(s)). Objective  Body mass index is 27.72 kg/m. Wt Readings from Last 3 Encounters:  05/21/18 166 lb 9.6 oz (75.6 kg)  03/16/18 162 lb (73.5 kg)  03/06/18 166 lb 12.8 oz (75.7 kg)   Temp Readings from Last 3 Encounters:  05/21/18 (!) 97.3 F (36.3 C) (Oral)  03/16/18 98 F (36.7 C) (Oral)  03/06/18 98.2 F (36.8 C) (Oral)   BP Readings from Last 3 Encounters:  05/21/18 136/80  03/16/18 (!) 144/86  03/06/18 126/78   Pulse Readings from Last 3 Encounters:  05/21/18 74  03/16/18 89  03/06/18 81    Physical Exam Vitals signs and nursing note reviewed.  Constitutional:      Appearance: Normal appearance. She is well-developed, well-groomed and overweight.  HENT:     Head: Normocephalic and atraumatic.     Nose: Nose normal.     Mouth/Throat:     Mouth: Mucous membranes are moist.     Pharynx: Oropharynx is clear.  Eyes:     Conjunctiva/sclera: Conjunctivae normal.     Pupils: Pupils are equal, round, and reactive to light.   Cardiovascular:     Rate and Rhythm: Normal rate and regular rhythm.     Heart  sounds: Normal heart sounds.  Pulmonary:     Effort: Pulmonary effort is normal.     Breath sounds: Normal breath sounds.  Skin:    General: Skin is warm and dry.  Neurological:     General: No focal deficit present.     Mental Status: She is alert and oriented to person, place, and time. Mental status is at baseline.     Gait: Gait normal.  Psychiatric:        Attention and Perception: Attention and perception normal.        Mood and Affect: Mood and affect normal.        Speech: Speech normal.        Behavior: Behavior normal. Behavior is cooperative.        Thought Content: Thought content normal.        Cognition and Memory: Cognition and memory normal.        Judgment: Judgment normal.     Assessment   1. Anxiety/insomnia sx's controlled  2. Psoriasis elbows with joint pain  3. HLD  4. Elevated Alk phos with h/o gallstones  5. Vitamin D 16  6. HM Plan   1. Cont meds upcoming appt Dr. Nicolasa Ducking 05/26/18  Refilled meds xanax and ambien  2. Rx clobetasol cream for elbows  S/p steroid injection left elbow today Prn Diclofenac  3.  Given cholesterol handout  4.declines [place] will monitor if continues will further w/u with US abdomen ggt declines etoh use  5. High dose weekly D3 x 6 months then 5000 IU daily  6.  Labs had 05/12/18 [facility] scanned in see phone note 05/17/2018   Flu had at work 12/30/17 utdTdap 07/30/16  Had 3/3 hep B vaccines  Pap today1/28/19 neg neg hpv +BV mammoneg4/9/19 negreferred today with implants  [facility]onoscopy[place] 05/02[facility]17 [place] [facility] Refer to dermatology today Dr. Janit Bern yet been  Seeing rheumatologyDr. Meda Coffee saw today 05/21/18   05/12/2018 labs [facility] form lipid, A1C, TSH, UA, vit D, urine culture h/o hematuria see result ntoe 05/17/2018     Provider: Dr. Olivia Mackie McLean-Scocuzza-[facility]

## 2018-05-21 NOTE — Patient Instructions (Addendum)
Tumeric Glucosamine/chondroitin  Vitamin D3 1x per week x 6 months then month 7 5000 IU daily vitamin D 3     Psoriasis  Psoriasis is a long-term (chronic) condition of skin inflammation. It occurs because your immune system causes skin cells to form too quickly. As a result, too many skin cells grow and create raised, red patches (plaques) that look silvery on your skin. Plaques may appear anywhere on your body. They can be any size or shape. Psoriasis can come and go. The condition varies from mild to very severe. It cannot be passed from one person to another (not contagious). What are the causes? The cause of psoriasis is not known, but certain factors can make the condition worse. These include:  Damage or trauma to the skin, such as cuts, scrapes, sunburn, and dryness.  Lack of sunlight.  Certain medicines.  Alcohol.  Tobacco use.  Stress.  Infections caused by bacteria or viruses. What increases the risk? This condition is more likely to develop in:  People with a family history of psoriasis.  People who are Caucasian.  People who are between the ages of 15-7630 and 1550-53 years old. What are the signs or symptoms? There are five different types of psoriasis. You can have more than one type of psoriasis during your life. Types are:  Plaque.  Guttate.  Inverse.  Pustular.  Erythrodermic. Each type of psoriasis has different symptoms.  Plaque psoriasis symptoms include red, raised plaques with a silvery white coating (scale). These plaques may be itchy. Your nails may be pitted and crumbly or fall off.  Guttate psoriasis symptoms include small red spots that often show up on your trunk, arms, and legs. These spots may develop after you have been sick, especially with strep throat.  Inverse psoriasis symptoms include plaques in your underarm area, under your breasts, or on your genitals, groin, or buttocks.  Pustular psoriasis symptoms include pus-filled bumps  that are painful, red, and swollen on the palms of your hands or the soles of your feet. You also may feel exhausted, feverish, weak, or have no appetite.  Erythrodermic psoriasis symptoms include bright red skin that may look burned. You may have a fast heartbeat and a body temperature that is too high or too low. You may be itchy or in pain. How is this diagnosed? Your health care provider may suspect psoriasis based on your symptoms and family history. Your health care provider will also do a physical exam. This may include a procedure to remove a tissue sample (biopsy) for testing. You may also be referred to a health care provider who specializes in skin diseases (dermatologist). How is this treated? There is no cure for this condition, but treatment can help manage it. Goals of treatment include:  Helping your skin heal.  Reducing itching and inflammation.  Slowing the growth of new skin cells.  Helping your immune system respond better to your skin. Treatment varies, depending on the severity of your condition. Treatment may include:  Creams or ointments.  Ultraviolet ray exposure (light therapy). This may include natural sunlight or light therapy in a medical office.  Medicines (systemic therapy). These medicines can help your body better manage skin cell turnover and inflammation. They may be used along with light therapy or ointments. You may also get antibiotic medicines if you have an infection. Follow these instructions at home: Skin Care  Moisturize your skin as needed. Only use moisturizers that have been approved by your health care provider.  Apply cool compresses to the affected areas.  Do not scratch your skin. Lifestyle   Do not use tobacco products. This includes cigarettes, chewing tobacco, and e-cigarettes. If you need help quitting, ask your health care provider.  Drink little or no alcohol.  Try techniques for stress reduction, such as meditation or  yoga.  Get exposure to the sun as told by your health care provider. Do not get sunburned.  Consider joining a psoriasis support group. Medicines  Take or use over-the-counter and prescription medicines only as told by your health care provider.  If you were prescribed an antibiotic, take or use it as told by your health care provider. Do not stop taking the antibiotic even if your condition starts to improve. General instructions  Keep a journal to help track what triggers an outbreak. Try to avoid any triggers.  See a counselor or social worker if feelings of sadness, frustration, and hopelessness about your condition are interfering with your work and relationships.  Keep all follow-up visits as told by your health care provider. This is important. Contact a health care provider if:  Your pain gets worse.  You have increasing redness or warmth in the affected areas.  You have new or worsening pain or stiffness in your joints.  Your nails start to break easily or pull away from the nail bed.  You have a fever.  You feel depressed. This information is not intended to replace advice given to you by your health care provider. Make sure you discuss any questions you have with your health care provider. Document Released: 03/15/2000 Document Revised: 08/24/2015 Document Reviewed: 08/03/2014 Elsevier Interactive Patient Education  2019 Elsevier Inc.   Vitamin D Deficiency Vitamin D deficiency is when your body does not have enough vitamin D. Vitamin D is important to your body for many reasons:  It helps the body to absorb two important minerals, called calcium and phosphorus.  It plays a role in bone health.  It may help to prevent some diseases, such as diabetes and multiple sclerosis.  It plays a role in muscle function, including heart function. You can get vitamin D by:  Eating foods that naturally contain vitamin D.  Eating or drinking milk or other dairy products  that have vitamin D added to them.  Taking a vitamin D supplement or a multivitamin supplement that contains vitamin D.  Being in the sun. Your body naturally makes vitamin D when your skin is exposed to sunlight. Your body changes the sunlight into a form of the vitamin that the body can use. If vitamin D deficiency is severe, it can cause a condition in which your bones become soft. In adults, this condition is called osteomalacia. In children, this condition is called rickets. What are the causes? Vitamin D deficiency may be caused by:  Not eating enough foods that contain vitamin D.  Not getting enough sun exposure.  Having certain digestive system diseases that make it difficult for your body to absorb vitamin D. These diseases include Crohn disease, chronic pancreatitis, and cystic fibrosis.  Having a surgery in which a part of the stomach or a part of the small intestine is removed.  Being obese.  Having chronic kidney disease or liver disease. What increases the risk? This condition is more likely to develop in:  Older people.  People who do not spend much time outdoors.  People who live in a long-term care facility.  People who have had broken bones.  People with  weak or thin bones (osteoporosis).  People who have a disease or condition that changes how the body absorbs vitamin D.  People who have dark skin.  People who take certain medicines, such as steroid medicines or certain seizure medicines.  People who are overweight or obese. What are the signs or symptoms? In mild cases of vitamin D deficiency, there may not be any symptoms. If the condition is severe, symptoms may include:  Bone pain.  Muscle pain.  Falling often.  Broken bones caused by a minor injury. How is this diagnosed? This condition is usually diagnosed with a blood test. How is this treated? Treatment for this condition may depend on what caused the condition. Treatment options  include:  Taking vitamin D supplements.  Taking a calcium supplement. Your health care provider will suggest what dose is best for you. Follow these instructions at home:  Take medicines and supplements only as told by your health care provider.  Eat foods that contain vitamin D. Choices include: ? Fortified dairy products, cereals, or juices. Fortified means that vitamin D has been added to the food. Check the label on the package to be sure. ? Fatty fish, such as salmon or trout. ? Eggs. ? Oysters.  Do not use a tanning bed.  Maintain a healthy weight. Lose weight, if needed.  Keep all follow-up visits as told by your health care provider. This is important. Contact a health care provider if:  Your symptoms do not go away.  You feel like throwing up (nausea) or you throw up (vomit).  You have fewer bowel movements than usual or it is difficult for you to have a bowel movement (constipation). This information is not intended to replace advice given to you by your health care provider. Make sure you discuss any questions you have with your health care provider. Document Released: 06/10/2011 Document Revised: 08/30/2015 Document Reviewed: 08/03/2014 Elsevier Interactive Patient Education  2019 [employer].

## 2018-05-21 NOTE — Progress Notes (Signed)
Pre visit review using our clinic review tool, if applicable. No additional management support is needed unless otherwise documented below in the visit note. 

## 2018-05-25 ENCOUNTER — Ambulatory Visit: Payer: BLUE CROSS/BLUE SHIELD | Admitting: Family Medicine

## 2018-05-25 VITALS — BP 156/88 | HR 78 | Temp 98.6°F | Resp 16 | Ht 65.5 in | Wt 164.2 lb

## 2018-05-25 DIAGNOSIS — R110 Nausea: Secondary | ICD-10-CM

## 2018-05-25 DIAGNOSIS — R509 Fever, unspecified: Secondary | ICD-10-CM | POA: Diagnosis not present

## 2018-05-25 DIAGNOSIS — R059 Cough, unspecified: Secondary | ICD-10-CM

## 2018-05-25 DIAGNOSIS — R05 Cough: Secondary | ICD-10-CM

## 2018-05-25 DIAGNOSIS — R197 Diarrhea, unspecified: Secondary | ICD-10-CM

## 2018-05-25 DIAGNOSIS — J101 Influenza due to other identified influenza virus with other respiratory manifestations: Secondary | ICD-10-CM | POA: Diagnosis not present

## 2018-05-25 LAB — POC INFLUENZA A&B (BINAX/QUICKVUE)
Influenza A, POC: POSITIVE — AB
Influenza B, POC: NEGATIVE

## 2018-05-25 LAB — POCT RAPID STREP A (OFFICE): Rapid Strep A Screen: NEGATIVE

## 2018-05-25 MED ORDER — BENZONATATE 100 MG PO CAPS: 100.0000 mg | ORAL_CAPSULE | Freq: Three times a day (TID) | ORAL | 0 refills | Status: DC | PRN

## 2018-05-25 MED ORDER — ONDANSETRON 4 MG PO TBDP: 4.0000 mg | ORAL_TABLET | Freq: Three times a day (TID) | ORAL | 0 refills | Status: DC | PRN

## 2018-05-25 NOTE — Progress Notes (Signed)
Subjective:    Patient ID: Shelly Stevenson, female    DOB: Oct 09, 1965, 53 y.o.   MRN: [id number]  HPI  Presents to clinic c/o fever, chills, diarrhea, body aches, cough for 3-4 days.   Patient has taken over-the-counter Tylenol for aches, Mucinex for cough, Alka-Seltzer cold and flu and Imodium to help diarrhea with OK effect.  Denies chest pain.  Denies wheezing or shortness of breath.  Denies nausea or vomiting; some nausea 2 days ago but none today.  Patient Active Problem List   Diagnosis Date Noted  . Vitamin D deficiency 05/21/2018  . Psoriatic arthritis (HCC) 03/06/2018  . Overweight (BMI 25.0-29.9) 03/06/2018  . Hoarseness of voice 12/12/2017  . Low back pain 07/30/2017  . HLD (hyperlipidemia) 06/13/2017  . Bacterial vaginosis 06/13/2017  . Obesity (BMI 30-39.9) 06/13/2017  . Rash 06/13/2017  . Special screening for malignant neoplasms, colon   . Positive ANA (antinuclear antibody) 04/28/2017  . Anxiety 03/24/2017  . Depression, recurrent (HCC) 03/24/2017  . Menopause 03/24/2017  . Insomnia 03/24/2017  . Psoriasis 03/24/2017  . Arthralgia 03/24/2017  . Hot flashes 03/24/2017  . Anemia 03/24/2017  . Bilateral carpal tunnel syndrome 03/24/2017   Social History   Tobacco Use  . Smoking status: Current Every Day Smoker  . Smokeless tobacco: Never Used  . Tobacco comment: <1ppd x 30 years no FH lung cancer   Substance Use Topics  . Alcohol use: Yes    Comment: socially per pt    Review of Systems  Constitutional: +chills, fatigue and fever.  HENT: +congestion, ear pain, sinus pain and sore throat.   Eyes: Negative.   Respiratory: +cough. Negative for shortness of breath and wheezing.   Cardiovascular: Negative for chest pain, palpitations and leg swelling.  Gastrointestinal: +diarrhea. Negative for abdominal pain, nausea and vomiting.  Genitourinary: Negative for dysuria, frequency and urgency.  Musculoskeletal:+body aches.  Skin: Negative for color change,  pallor and rash.  Neurological: Negative for syncope, light-headedness and headaches.  Psychiatric/Behavioral: The patient is not nervous/anxious.       Objective:   Physical Exam Vitals signs and nursing note reviewed.  Constitutional:      General: She is not in acute distress.    Appearance: She is ill-appearing (appears tired. ). She is not toxic-appearing.  HENT:     Head: Normocephalic and atraumatic.     Nose: Congestion and rhinorrhea present.     Mouth/Throat:     Mouth: Mucous membranes are moist.     Pharynx: Posterior oropharyngeal erythema present. No oropharyngeal exudate.  Eyes:     General: No scleral icterus.       Right eye: No discharge.        Left eye: No discharge.     Extraocular Movements: Extraocular movements intact.     Conjunctiva/sclera: Conjunctivae normal.  Neck:     Musculoskeletal: Neck supple. No neck rigidity.  Cardiovascular:     Rate and Rhythm: Normal rate and regular rhythm.  Pulmonary:     Effort: Pulmonary effort is normal. No respiratory distress.     Breath sounds: Normal breath sounds. No wheezing, rhonchi or rales.  Abdominal:     General: Bowel sounds are normal. There is no distension.     Palpations: Abdomen is soft.     Tenderness: There is no abdominal tenderness.  Lymphadenopathy:     Cervical: No cervical adenopathy.  Skin:    General: Skin is warm and dry.  Neurological:     Mental  Status: She is alert and oriented to person, place, and time.  Psychiatric:        Mood and Affect: Mood normal.        Behavior: Behavior normal.    Vitals:   05/25/18 1440  BP: (!) 156/88  Pulse: 78  Resp: 16  Temp: 98.6 F (37 C)  SpO2: 97%       Assessment & Plan:   Influenza A, fever chills, nausea, cough, diarrhea -point-of-care flu testing is positive for flu in clinic.  This explains all patient symptoms.  Patient is out of the timeframe to begin taking Tamiflu.  She will do supportive care with alternating Tylenol and  Motrin to help with aches and fever, keeping up good fluid intake, getting plenty of rest, using Zofran as needed for nausea, using Imodium as needed for diarrhea, using Tessalon Perles as needed for cough.  Out of work note given to allow time to rest and recover.  Patient advised she should begin to feel better over the next 2 to 3 days.  If her symptoms worsen, she has been advised to call office right away.

## 2018-05-25 NOTE — Patient Instructions (Addendum)

## 2018-05-27 ENCOUNTER — Telehealth: Payer: Self-pay | Admitting: *Deleted

## 2018-05-27 ENCOUNTER — Telehealth: Payer: Self-pay | Admitting: Lab

## 2018-05-27 NOTE — Telephone Encounter (Signed)
Work note

## 2018-05-27 NOTE — Telephone Encounter (Signed)
Pt called [facility]  Reason for CRM: Patient was diagnosed with the flu and a letter was written for her to be out of work to 05/26/2018 but she is still not feeling well. So she would like for the provider to extend her out of work time one more day to 05/27/2018. >>May 27, 2018 8:14 AM Carin Primrose F wrote: Patient would like for the out of work letter to be mailed to her home. Wrote Pt a work excuse note and mailed to her home.

## 2018-05-27 NOTE — Telephone Encounter (Signed)
Work note sent to Pt today

## 2018-05-27 NOTE — Telephone Encounter (Signed)
Letter done and mailed out

## 2018-05-27 NOTE — Telephone Encounter (Signed)
Yes you can extend letter for 2/26 and 2/27

## 2018-05-27 NOTE — Telephone Encounter (Signed)
Copied from CRM [id number]. Topic: General - Other >> May 27, 2018  8:06 AM Trula Slade wrote: Reason for CRM:   Patient was diagnosed with the flu and a letter was written for her to be out of work to 05/26/2018 but she is still not feeling well.  So she would like for the provider to extend her out of work time one more day to 05/27/2018. >> May 27, 2018  8:14 AM Trula Slade wrote: Patient would like for the out of work letter to be mailed to her home.

## 2018-05-27 NOTE — Telephone Encounter (Signed)
Pt called [facility]  Reason for CRM:   Patient was diagnosed with the flu and a letter was written for her to be out of work to 05/26/2018 but she is still not feeling well.  So she would like for the provider to extend her out of work time one more day to 05/27/2018. >> May 27, 2018  8:14 AM Trula Slade wrote: Patient would like for the out of work letter to be mailed to her home.

## 2018-05-29 ENCOUNTER — Telehealth: Payer: Self-pay | Admitting: Internal Medicine

## 2018-05-29 ENCOUNTER — Telehealth: Payer: Self-pay | Admitting: Lab

## 2018-05-29 NOTE — Telephone Encounter (Signed)
Pt states she needs a note for Thursday 05/28/2018 also.  She states she will come by the office later this afternoon to pick it up.  Please call pt when ready.    [phone]

## 2018-05-29 NOTE — Telephone Encounter (Signed)
Pt called [facility] Pt states she needs a note for Thursday 05/28/2018 also.  She states she will come by the office later this afternoon to pick it up.  Please call pt when ready.    [phone]

## 2018-05-29 NOTE — Telephone Encounter (Signed)
Yes you can extend note for 2/27 also

## 2018-05-29 NOTE — Telephone Encounter (Signed)
Pt. called and unable to get results on MyChart of Flu and Strep results; advised of results.  Verb. Understanding.  (documented in TE, as no result not was forwarded to Nurse Triage.)

## 2018-05-29 NOTE — Telephone Encounter (Signed)
Pt called [facility] Pt states she needs a note for Thursday 05/28/2018 also. She states she will come by the office later this afternoon to pick it up. Please call pt when ready.

## 2018-06-02 ENCOUNTER — Encounter: Payer: Self-pay | Admitting: Internal Medicine

## 2018-06-02 ENCOUNTER — Ambulatory Visit: Payer: BLUE CROSS/BLUE SHIELD | Admitting: Internal Medicine

## 2018-06-02 VITALS — BP 156/88 | HR 92 | Temp 98.2°F | Ht 65.5 in | Wt 165.0 lb

## 2018-06-02 DIAGNOSIS — I10 Essential (primary) hypertension: Secondary | ICD-10-CM

## 2018-06-02 DIAGNOSIS — J101 Influenza due to other identified influenza virus with other respiratory manifestations: Secondary | ICD-10-CM

## 2018-06-02 DIAGNOSIS — R110 Nausea: Secondary | ICD-10-CM | POA: Diagnosis not present

## 2018-06-02 MED ORDER — ONDANSETRON 4 MG PO TBDP: 4.0000 mg | ORAL_TABLET | Freq: Three times a day (TID) | ORAL | 0 refills | Status: DC | PRN

## 2018-06-02 MED ORDER — AMLODIPINE BESYLATE 2.5 MG PO TABS: 2.5000 mg | ORAL_TABLET | Freq: Every day | ORAL | 0 refills | Status: DC

## 2018-06-02 NOTE — Patient Instructions (Signed)

## 2018-06-02 NOTE — Progress Notes (Signed)
Pre visit review using our clinic review tool, if applicable. No additional management support is needed unless otherwise documented below in the visit note. 

## 2018-06-02 NOTE — Progress Notes (Signed)
Chief Complaint  Patient presents with  . Follow-up   F/u  1. Sick since 05/22/2018 with cough with sputum, body aches, fatigue, denies fever. She is having nausea w/o vomiting and prn zofran helps She was dx'ed with flu A 05/25/18 though did not get tamiflu. She needs a note for work  2. HTN BP elevated 168/96 repeat 156/88 not on BP meds BP intermittently spiking x years    Review of Systems  Constitutional: Positive for malaise/fatigue. Negative for fever and weight loss.  HENT: Negative for hearing loss.   Eyes: Negative for blurred vision.  Respiratory: Positive for cough and sputum production.   Cardiovascular: Negative for chest pain.  Gastrointestinal: Positive for nausea. Negative for abdominal pain.  Musculoskeletal: Negative for myalgias.  Skin: Negative for rash.  Neurological: Negative for headaches.  Psychiatric/Behavioral: Negative for depression. The patient is nervous/anxious.    Past Medical History:  Diagnosis Date  . Anemia    with PICA sx's   . Anxiety   . Bacterial vaginosis   . Chicken pox   . Depression   . HSV (herpes simplex virus) infection   . Hypertension   . Vitamin D deficiency    Past Surgical History:  Procedure Laterality Date  . APPENDECTOMY     53 y.o.   . AUGMENTATION MAMMAPLASTY Bilateral   . COLONOSCOPY WITH PROPOFOL N/A 05/02/2017   Procedure: COLONOSCOPY WITH PROPOFOL;  Surgeon: Pasty Spillers, MD;  Location: [facility] ENDOSCOPY;  Service: Endoscopy;  Laterality: N/A;  . FRACTURE SURGERY     Left thumb   Family History  Problem Relation Age of Onset  . Arthritis Mother   . Cancer Mother        breast  . Hearing loss Mother   . Hypertension Mother   . Breast cancer Mother 14  . Arthritis Father   . Cancer Father        colon dx'ed age 34s   . Hypertension Father   . Heart disease Father        MI  . Diabetes Father   . Mental illness Son        [facility]   Social History   Socioeconomic History  . Marital status: Divorced     Spouse name: Not on file  . Number of children: Not on file  . Years of education: Not on file  . Highest education level: Not on file  Occupational History  . Not on file  Social Needs  . Financial resource strain: Not on file  . Food insecurity:    Worry: Not on file    Inability: Not on file  . Transportation needs:    Medical: Not on file    Non-medical: Not on file  Tobacco Use  . Smoking status: Current Every Day Smoker  . Smokeless tobacco: Never Used  . Tobacco comment: <1ppd x 30 years no FH lung cancer   Substance and Sexual Activity  . Alcohol use: Yes    Comment: socially per pt   . Drug use: No  . Sexual activity: Yes    Comment: men  Lifestyle  . Physical activity:    Days per week: Not on file    Minutes per session: Not on file  . Stress: Not on file  Relationships  . Social connections:    Talks on phone: Not on file    Gets together: Not on file    Attends religious service: Not on file    Active member  of club or organization: Not on file    Attends meetings of clubs or organizations: Not on file    Relationship status: Not on file  . Intimate partner violence:    Fear of current or ex partner: Not on file    Emotionally abused: Not on file    Physically abused: Not on file    Forced sexual activity: Not on file  Other Topics Concern  . 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 ALPRAZolam (XANAX) 0.5 MG tablet Take 1 tablet (0.5 mg total) by mouth daily as needed for anxiety.  . benzonatate (TESSALON) 100 MG capsule Take 1 capsule (100 mg total) by mouth 3 (three) times daily as needed.  . Cholecalciferol 1.25 MG (50000 UT) capsule Take 1 capsule (50,000 Units total) by mouth once a week.  . clobetasol cream (TEMOVATE) 0.05 % Apply 1 application topically 2 (two) times  daily. To elbows and hands  . diclofenac (CATAFLAM) 50 MG tablet Take 1 tablet (50 mg total) by mouth 2 (two) times daily as needed.  . meloxicam (MOBIC) 15 MG tablet Take 1 tablet (15 mg total) by mouth daily.  . methocarbamol (ROBAXIN) 500 MG tablet Take 1 tablet (500 mg total) by mouth 2 (two) times daily as needed for muscle spasms.  . ondansetron (ZOFRAN ODT) 4 MG disintegrating tablet Take 1 tablet (4 mg total) by mouth every 8 (eight) hours as needed for nausea or vomiting.  . phentermine (ADIPEX-P) 37.5 MG tablet Take 1 tablet (37.5 mg total) by mouth daily before breakfast.  . triamcinolone cream (KENALOG) 0.1 % Bid elbows avoid face/groin/private  . venlafaxine XR (EFFEXOR-XR) 150 MG 24 hr capsule Take 1 capsule (150 mg total) by mouth daily with breakfast.  . zolpidem (AMBIEN CR) 6.25 MG CR tablet Take 1 tablet (6.25 mg total) by mouth at bedtime as needed for sleep.  . [DISCONTINUED] ondansetron (ZOFRAN ODT) 4 MG disintegrating tablet Take 1 tablet (4 mg total) by mouth every 8 (eight) hours as needed for nausea or vomiting.   Allergies  Allergen Reactions  . Penicillins Swelling    Angioedema  . Codeine Rash    Elevated heartrate   Recent Results (from the past 2160 hour(s))  POC Influenza A&B(BINAX/QUICKVUE)     Status: Abnormal   Collection Time: 05/25/18  2:50 PM  Result Value Ref Range   Influenza A, POC Positive (A) Negative   Influenza B, POC Negative Negative  POCT rapid strep A     Status: Normal   Collection Time: 05/25/18  2:50 PM  Result Value Ref Range   Rapid Strep A Screen Negative Negative   Objective  Body mass index is 27.04 kg/m. Wt Readings from Last 3 Encounters:  06/02/18 165 lb (74.8 kg)  05/25/18 164 lb 3.2 oz (74.5 kg)  05/21/18 166 lb 9.6 oz (75.6 kg)   Temp Readings from Last 3 Encounters:  06/02/18 98.2 F (36.8 C) (Oral)  05/25/18 98.6 F (37 C) (Oral)  05/21/18 (!) 97.3 F (36.3 C) (Oral)   BP Readings from Last 3 Encounters:   06/02/18 (!) 156/88  05/25/18 (!) 156/88  05/21/18 136/80   Pulse Readings from Last 3 Encounters:  06/02/18 92  05/25/18 78  05/21/18 74    Physical Exam Vitals signs and nursing note reviewed.  Constitutional:      Appearance: Normal appearance. She is well-developed and well-groomed.  HENT:     Head: Normocephalic and atraumatic.     Nose: Nose normal.     Mouth/Throat:     Mouth: Mucous membranes are moist.     Pharynx: Oropharynx is clear.  Eyes:     Conjunctiva/sclera: Conjunctivae normal.     Pupils: Pupils are equal, round, and reactive to light.  Cardiovascular:     Rate and Rhythm: Normal rate and regular rhythm.     Heart sounds: Normal heart sounds. No murmur.  Pulmonary:     Effort: Pulmonary effort is normal.     Breath sounds: Normal breath sounds.  Skin:    General: Skin is warm and dry.  Neurological:     General: No focal deficit present.     Mental Status: She is alert and oriented to person, place, and time. Mental status is at baseline.     Gait: Gait normal.  Psychiatric:        Attention and Perception: Attention and perception normal.        Mood and Affect: Mood and affect normal.        Speech: Speech normal.        Behavior: Behavior normal. Behavior is cooperative.        Thought Content: Thought content normal.        Cognition and Memory: Cognition and memory normal.        Judgment: Judgment normal.     Assessment   1. Influenza A  2. HTN  3. HM  Plan  1.  Given note for work feeling better will refill zofran prn for now call back Thursday if not better  2.  norvasc 2.5 mg qd call back in 2 weeks and log BP  If not improved increase to 5 mg qd  3.  Fluhad atwork10/1/19 utdTdap5/1/18  Had 3/3 hep B vaccines  Pap today1/28/19 neg neg hpv +BV mammoneg4/9/19 negreferred today with implants  [facility] [facility] Refer to dermatology today  Dr. Roxan Diesel yet been  SeeingrheumatologyDr. Katherine Basset today 05/21/18   05/12/2018 labs [facility] form lipid, A1C, TSH, UA, vit D, urine culture h/o hematuriasee result ntoe 05/17/2018  Provider: Dr. French Ana McLean-Scocuzza-Internal Medicine

## 2018-06-11 ENCOUNTER — Telehealth: Payer: Self-pay | Admitting: Internal Medicine

## 2018-06-11 NOTE — Telephone Encounter (Signed)
Pt was advised to call Dr. Alveda Reasons office for refill since Dr. Lonie Peak is out of the office. There was no clarification stating which one she should be on. Patient was okay with doing this.

## 2018-06-11 NOTE — Telephone Encounter (Signed)
Message states she has been receiving diclofenac sodium.  Per review of chart, I only see the rx for the cataflam.  Who has been previously refilling this medication?  Need to clarify with pt medication, dose and how often taking.  Also, how many of the previous medications dose she have left?

## 2018-06-11 NOTE — Telephone Encounter (Unsigned)
Copied from CRM [id number]. Topic: Quick Communication - Rx Refill/Question >> Jun 11, 2018  2:35 PM Wyonia Hough E wrote: Medication: diclofenac (CATAFLAM) 50 MG tablet - Pt states she picked up her Rx and it was diclofenac Potassium but she takes diclofenac Sodium 50mg  and wanted to see if a new Rx or refill can be called in asap / please advise   Has the patient contacted their pharmacy? Yes   Preferred Pharmacy (with phone number or street name): [facility] [address] ([address]), [place] - [address] [phone] (Phone) [phone] (Fax)    Agent: Please be advised that RX refills may take up to 3 business days. We ask that you follow-up with your pharmacy.

## 2018-06-19 ENCOUNTER — Telehealth: Payer: Self-pay

## 2018-06-19 NOTE — Telephone Encounter (Signed)
Copied from CRM [id number]. Topic: General - Other >> Jun 19, 2018  4:24 PM Jaquita Rector A wrote: Reason for CRM: Patient was informed that there should be refills at the pharmacy on ALPRAZolam Prudy Feeler) 0.5 MG tablet per Rx sent in on 05/21/2018

## 2018-06-19 NOTE — Telephone Encounter (Signed)
Pt called while at the Pharmacy inquiring about Alprazolam (Xanax) refill.  Pt said that the pharmacy auto call system sent her a call since there was a new Rx number.  Pt says that pharmacy has told her that she is not due for a refill until next week.  Pt says that she is ok to wait for refill until next week.

## 2018-07-08 ENCOUNTER — Other Ambulatory Visit: Payer: Self-pay | Admitting: Internal Medicine

## 2018-07-08 ENCOUNTER — Telehealth: Payer: Self-pay | Admitting: Internal Medicine

## 2018-07-08 DIAGNOSIS — F419 Anxiety disorder, unspecified: Secondary | ICD-10-CM

## 2018-07-08 MED ORDER — ALPRAZOLAM 0.5 MG PO TABS: 0.5000 mg | ORAL_TABLET | Freq: Two times a day (BID) | ORAL | 2 refills | Status: DC | PRN

## 2018-07-08 NOTE — Telephone Encounter (Signed)
We will only increase temporarily  Make sure she mentions increased anxiety to psychiatry and therapy  Xanax 2x per day long term is not the solution we need to find other non addictive techniques to control anxiety   Be safe TMS

## 2018-07-08 NOTE — Telephone Encounter (Signed)
Left message for patient to return call back. PEC may give information.  

## 2018-07-08 NOTE — Telephone Encounter (Signed)
Pt left a vm stating she has been having an increased in her anxiety due to being in the house and has been taking her xanax twice a day. Pt is requesting a new rx.

## 2018-07-09 ENCOUNTER — Ambulatory Visit: Payer: BLUE CROSS/BLUE SHIELD | Admitting: Internal Medicine

## 2018-07-14 ENCOUNTER — Ambulatory Visit: Payer: BLUE CROSS/BLUE SHIELD | Admitting: Internal Medicine

## 2018-07-14 ENCOUNTER — Telehealth (INDEPENDENT_AMBULATORY_CARE_PROVIDER_SITE_OTHER): Payer: BLUE CROSS/BLUE SHIELD | Admitting: Internal Medicine

## 2018-07-14 DIAGNOSIS — Z20818 Contact with and (suspected) exposure to other bacterial communicable diseases: Secondary | ICD-10-CM | POA: Diagnosis not present

## 2018-07-14 DIAGNOSIS — L02422 Furuncle of left axilla: Secondary | ICD-10-CM

## 2018-07-14 MED ORDER — MUPIROCIN 2 % EX OINT: 1.0000 "application " | TOPICAL_OINTMENT | Freq: Two times a day (BID) | CUTANEOUS | 0 refills | Status: DC

## 2018-07-14 MED ORDER — CHLORHEXIDINE GLUCONATE 4 % EX LIQD: Freq: Every day | CUTANEOUS | 0 refills | Status: DC | PRN

## 2018-07-14 NOTE — Progress Notes (Signed)
See telephone note 07/14/2018   TMS

## 2018-07-14 NOTE — Telephone Encounter (Signed)
Telephone Note  I connected with Shelly Stevenson on 07/14/2018 at 8:13 am  by telephone verified that I am speaking with the correct person using two identifiers.  Location patient: home Location provider:work  Persons participating in the virtual visit: patient, provider  I discussed the limitations of evaluation and management by telemedicine and the availability of in person appointments. The patient expressed understanding and agreed to proceed.   HPI: Left arm had boil that took 2-3 weeks to resolve and last week finally went away surrounding area was red around it her partner had MRSA and she thinks it was the same and may have used the same washcloth as him.     ROS: See pertinent positives and negatives per HPI.  Past Medical History:  Diagnosis Date  . Anemia    with PICA sx's   . Anxiety   . Bacterial vaginosis   . Chicken pox   . Depression   . HSV (herpes simplex virus) infection   . Hypertension   . Vitamin D deficiency     Past Surgical History:  Procedure Laterality Date  . APPENDECTOMY     53 y.o.   . AUGMENTATION MAMMAPLASTY Bilateral   . COLONOSCOPY WITH PROPOFOL N/A 05/02/2017   Procedure: COLONOSCOPY WITH PROPOFOL;  Surgeon: Pasty Spillers, MD;  Location: [facility] ENDOSCOPY;  Service: Endoscopy;  Laterality: N/A;  . FRACTURE SURGERY     Left thumb    Family History  Problem Relation Age of Onset  . Arthritis Mother   . Cancer Mother        breast  . Hearing loss Mother   . Hypertension Mother   . Breast cancer Mother 78  . Arthritis Father   . Cancer Father        colon dx'ed age 42s   . Hypertension Father   . Heart disease Father        MI  . Diabetes Father   . Mental illness Son        [facility]    SOCIAL HX: not working currently due to COVID 19    Current Outpatient Medications:  .  ALPRAZolam (XANAX) 0.5 MG tablet, Take 1 tablet (0.5 mg total) by mouth 2 (two) times daily as needed for anxiety., Disp: 30 tablet, Rfl: 2 .  amLODipine  (NORVASC) 2.5 MG tablet, Take 1 tablet (2.5 mg total) by mouth daily., Disp: 90 tablet, Rfl: 0 .  benzonatate (TESSALON) 100 MG capsule, Take 1 capsule (100 mg total) by mouth 3 (three) times daily as needed., Disp: 30 capsule, Rfl: 0 .  chlorhexidine (HIBICLENS) 4 % external liquid, Apply topically daily as needed. To wash body daily x 1 week avoid face and private area, Disp: 473 mL, Rfl: 0 .  Cholecalciferol 1.25 MG (50000 UT) capsule, Take 1 capsule (50,000 Units total) by mouth once a week., Disp: 13 capsule, Rfl: 1 .  clobetasol cream (TEMOVATE) 0.05 %, Apply 1 application topically 2 (two) times daily. To elbows and hands, Disp: 60 g, Rfl: 2 .  diclofenac (CATAFLAM) 50 MG tablet, Take 1 tablet (50 mg total) by mouth 2 (two) times daily as needed., Disp: 180 tablet, Rfl: 1 .  meloxicam (MOBIC) 15 MG tablet, Take 1 tablet (15 mg total) by mouth daily., Disp: 30 tablet, Rfl: 0 .  methocarbamol (ROBAXIN) 500 MG tablet, Take 1 tablet (500 mg total) by mouth 2 (two) times daily as needed for muscle spasms., Disp: 60 tablet, Rfl: 5 .  mupirocin ointment (BACTROBAN)  2 %, Apply 1 application topically 2 (two) times daily. To both nostrils x 5 days, Disp: 30 g, Rfl: 0 .  ondansetron (ZOFRAN ODT) 4 MG disintegrating tablet, Take 1 tablet (4 mg total) by mouth every 8 (eight) hours as needed for nausea or vomiting., Disp: 30 tablet, Rfl: 0 .  phentermine (ADIPEX-P) 37.5 MG tablet, Take 1 tablet (37.5 mg total) by mouth daily before breakfast., Disp: 60 tablet, Rfl: 0 .  triamcinolone cream (KENALOG) 0.1 %, Bid elbows avoid face/groin/private, Disp: 454 g, Rfl: 1 .  venlafaxine XR (EFFEXOR-XR) 150 MG 24 hr capsule, Take 1 capsule (150 mg total) by mouth daily with breakfast., Disp: 90 capsule, Rfl: 3 .  zolpidem (AMBIEN CR) 6.25 MG CR tablet, Take 1 tablet (6.25 mg total) by mouth at bedtime as needed for sleep., Disp: 31 tablet, Rfl: 5  EXAM: telephone   VITALS per patient if applicable:  GENERAL:  alert, oriented, appears well and in no acute distress  PSYCH/NEURO: pleasant and cooperative, no obvious depression or anxiety, speech and thought processing grossly intact  SKIN: per pt resolved lesion under left arm   ASSESSMENT AND PLAN:  Discussed the following assessment and plan:  Furuncle of left axilla - Plan: mupirocin ointment (BACTROBAN) 2 %, chlorhexidine (HIBICLENS) 4 % external liquid  MRSA exposure - Plan: mupirocin ointment (BACTROBAN) 2 %, chlorhexidine (HIBICLENS) 4 % external liquid suspected with resolved boil  Call back if gets another boil and will Rx Doxy/Bactrim     I discussed the assessment and treatment plan with the patient. The patient was provided an opportunity to ask questions and all were answered. The patient agreed with the plan and demonstrated an understanding of the instructions.   The patient was advised to call back or seek an in-person evaluation if the symptoms worsen or if the condition fails to improve as anticipated.  Time spent was 15 minutes  Shelly Bucklesracy N McLean-Scocuzza, MD

## 2018-08-28 ENCOUNTER — Other Ambulatory Visit: Payer: Self-pay

## 2018-08-28 ENCOUNTER — Telehealth: Payer: Self-pay | Admitting: Internal Medicine

## 2018-08-28 ENCOUNTER — Emergency Department
Admission: EM | Admit: 2018-08-28 | Discharge: 2018-08-28 | Disposition: A | Discharge status: Home / Self Care | Payer: BLUE CROSS/BLUE SHIELD | Attending: Emergency Medicine | Admitting: Emergency Medicine

## 2018-08-28 ENCOUNTER — Encounter: Payer: Self-pay | Admitting: Emergency Medicine

## 2018-08-28 DIAGNOSIS — S9030XA Contusion of unspecified foot, initial encounter: Secondary | ICD-10-CM

## 2018-08-28 DIAGNOSIS — S9032XA Contusion of left foot, initial encounter: Secondary | ICD-10-CM | POA: Insufficient documentation

## 2018-08-28 DIAGNOSIS — Y929 Unspecified place or not applicable: Secondary | ICD-10-CM | POA: Diagnosis not present

## 2018-08-28 DIAGNOSIS — Y999 Unspecified external cause status: Secondary | ICD-10-CM | POA: Diagnosis not present

## 2018-08-28 DIAGNOSIS — I10 Essential (primary) hypertension: Secondary | ICD-10-CM

## 2018-08-28 DIAGNOSIS — Z7189 Other specified counseling: Secondary | ICD-10-CM

## 2018-08-28 DIAGNOSIS — Z79899 Other long term (current) drug therapy: Secondary | ICD-10-CM | POA: Diagnosis not present

## 2018-08-28 DIAGNOSIS — W2203XA Walked into furniture, initial encounter: Secondary | ICD-10-CM | POA: Diagnosis not present

## 2018-08-28 DIAGNOSIS — F17210 Nicotine dependence, cigarettes, uncomplicated: Secondary | ICD-10-CM | POA: Diagnosis not present

## 2018-08-28 DIAGNOSIS — Y939 Activity, unspecified: Secondary | ICD-10-CM | POA: Diagnosis not present

## 2018-08-28 MED ORDER — AMLODIPINE BESYLATE 2.5 MG PO TABS: 2.5000 mg | ORAL_TABLET | Freq: Every day | ORAL | 0 refills | Status: DC

## 2018-08-28 NOTE — Telephone Encounter (Signed)
Medication has been refilled.

## 2018-08-28 NOTE — Discharge Instructions (Addendum)
Follow-up with your regular doctor as needed.  COVID testing is limited to people that are symptomatic at this time.  Tape the 2 toes together.  Elevate and ice your foot.

## 2018-08-28 NOTE — ED Provider Notes (Signed)
[facility] Emergency Department Provider Note  ____________________________________________   First MD Initiated Contact with Patient 08/28/18 1410     (approximate)  I have reviewed the triage vital signs and the nursing notes.   HISTORY  Chief Complaint Toe Pain    HPI Shelly Stevenson is a 53 y.o. female presents emergency department complaining of toe pain since yesterday.  She had on the bed.  Area is been bruised and tender.  She states it went in between her toes.  She also wants a COVID test although she has not been exposed anybody with COVID-19 or has any symptoms.    Past Medical History:  Diagnosis Date  . Anemia    with PICA sx's   . Anxiety   . Bacterial vaginosis   . Chicken pox   . Depression   . HSV (herpes simplex virus) infection   . Hypertension   . Vitamin D deficiency     Patient Active Problem List   Diagnosis Date Noted  . MRSA exposure 07/14/2018  . Essential hypertension 06/02/2018  . Influenza A 06/02/2018  . Vitamin D deficiency 05/21/2018  . Psoriatic arthritis (HCC) 03/06/2018  . Overweight (BMI 25.0-29.9) 03/06/2018  . Hoarseness of voice 12/12/2017  . Low back pain 07/30/2017  . HLD (hyperlipidemia) 06/13/2017  . Bacterial vaginosis 06/13/2017  . Obesity (BMI 30-39.9) 06/13/2017  . Rash 06/13/2017  . Special screening for malignant neoplasms, colon   . Positive ANA (antinuclear antibody) 04/28/2017  . Anxiety 03/24/2017  . Depression, recurrent (HCC) 03/24/2017  . Menopause 03/24/2017  . Insomnia 03/24/2017  . Psoriasis 03/24/2017  . Arthralgia 03/24/2017  . Hot flashes 03/24/2017  . Anemia 03/24/2017  . Bilateral carpal tunnel syndrome 03/24/2017    Past Surgical History:  Procedure Laterality Date  . APPENDECTOMY     53 y.o.   . AUGMENTATION MAMMAPLASTY Bilateral   . COLONOSCOPY WITH PROPOFOL N/A 05/02/2017   Procedure: COLONOSCOPY WITH PROPOFOL;  Surgeon: Pasty Spillers, MD;  Location: [facility]  ENDOSCOPY;  Service: Endoscopy;  Laterality: N/A;  . FRACTURE SURGERY     Left thumb    Prior to Admission medications   Medication Sig Start Date End Date Taking? Authorizing Provider  ALPRAZolam Prudy Feeler) 0.5 MG tablet Take 1 tablet (0.5 mg total) by mouth 2 (two) times daily as needed for anxiety. 07/08/18   McLean-Scocuzza, Pasty Spillers, MD  amLODipine (NORVASC) 2.5 MG tablet Take 1 tablet (2.5 mg total) by mouth daily. 08/28/18   McLean-Scocuzza, Pasty Spillers, MD  Cholecalciferol 1.25 MG (50000 UT) capsule Take 1 capsule (50,000 Units total) by mouth once a week. 05/17/18   McLean-Scocuzza, Pasty Spillers, MD  clobetasol cream (TEMOVATE) 0.05 % Apply 1 application topically 2 (two) times daily. To elbows and hands 05/21/18   McLean-Scocuzza, Pasty Spillers, MD  diclofenac (CATAFLAM) 50 MG tablet Take 1 tablet (50 mg total) by mouth 2 (two) times daily as needed. 05/21/18   McLean-Scocuzza, Pasty Spillers, MD  mupirocin ointment (BACTROBAN) 2 % Apply 1 application topically 2 (two) times daily. To both nostrils x 5 days 07/14/18   McLean-Scocuzza, Pasty Spillers, MD  phentermine (ADIPEX-P) 37.5 MG tablet Take 1 tablet (37.5 mg total) by mouth daily before breakfast. 03/06/18   McLean-Scocuzza, Pasty Spillers, MD  triamcinolone cream (KENALOG) 0.1 % Bid elbows avoid face/groin/private 03/24/17   McLean-Scocuzza, Pasty Spillers, MD  venlafaxine XR (EFFEXOR-XR) 150 MG 24 hr capsule Take 1 capsule (150 mg total) by mouth daily with breakfast. 03/19/18  McLean-Scocuzza, Pasty Spillersracy N, MD  zolpidem (AMBIEN CR) 6.25 MG CR tablet Take 1 tablet (6.25 mg total) by mouth at bedtime as needed for sleep. 05/21/18   McLean-Scocuzza, Pasty Spillersracy N, MD    Allergies Penicillins and Codeine  Family History  Problem Relation Age of Onset  . Arthritis Mother   . Cancer Mother        breast  . Hearing loss Mother   . Hypertension Mother   . Breast cancer Mother [id number]  . Arthritis Father   . Cancer Father        colon dx'ed age 6770s   . Hypertension Father   . Heart disease  Father        MI  . Diabetes Father   . Mental illness Son        [facility]    Social History Social History   Tobacco Use  . Smoking status: Current Every Day Smoker    Packs/day: 0.50    Types: Cigarettes  . Smokeless tobacco: Never Used  . Tobacco comment: <1ppd x 30 years no FH lung cancer   Substance Use Topics  . Alcohol use: Yes    Comment: socially per pt   . Drug use: No    Review of Systems  Constitutional: No fever/chills Eyes: No visual changes. ENT: No sore throat. Respiratory: Denies cough Genitourinary: Negative for dysuria. Musculoskeletal: Negative for back pain.  Positive for toe pain Skin: Negative for rash.    ____________________________________________   PHYSICAL EXAM:  VITAL SIGNS: ED Triage Vitals  Enc Vitals Group     BP 08/28/18 1321 (!) 137/98     Pulse Rate 08/28/18 1321 83     Resp 08/28/18 1321 18     Temp 08/28/18 1321 (!) 97.5 F (36.4 C)     Temp Source 08/28/18 1321 Oral     SpO2 08/28/18 1321 99 %     Weight 08/28/18 1322 170 lb (77.1 kg)     Height 08/28/18 1322 5\' 5"  (1.651 m)     Head Circumference --      Peak Flow --      Pain Score 08/28/18 1321 8     Pain Loc --      Pain Edu? --      Excl. in GC? --     Constitutional: Alert and oriented. Well appearing and in no acute distress. Eyes: Conjunctivae are normal.  Head: Atraumatic. Nose: No congestion/rhinnorhea. Mouth/Throat: Mucous membranes are moist.   Neck:  supple no lymphadenopathy noted Cardiovascular: Normal rate, regular rhythm.  Respiratory: Normal respiratory effort.  No retractions GU: deferred Musculoskeletal: FROM all extremities, warm and well perfused, top of the left foot is bruised and tender. Neurologic:  Normal speech and language.  Skin:  Skin is warm, dry and intact. No rash noted. Psychiatric: Mood and affect are normal. Speech and behavior are normal.  ____________________________________________   LABS (all labs ordered are listed,  but only abnormal results are displayed)  Labs Reviewed - No data to display ____________________________________________   ____________________________________________  RADIOLOGY  Patient refused  ____________________________________________   PROCEDURES  Procedure(s) performed: Patient refused postop shoe   Procedures    ____________________________________________   INITIAL IMPRESSION / ASSESSMENT AND PLAN / ED COURSE  Pertinent labs & imaging results that were available during my care of the patient were reviewed by me and considered in my medical decision making (see chart for details).   Patient is 53 year old female presents emergency department complaint of foot  pain and requesting COVID-19 test.  Actually have no idea for the patient is here because she is actually refusing any treatment for her foot.  She has no symptoms and has not been exposed to anyone with COVID-19.  Physical exam is basically unremarkable other than the bruise on top of the foot.  She is refusing any treatment for the foot.  She refused postop shoe.  She refused an x-ray.  Explained to her we do not do COVID-19 testing for asymptomatic or on exposed people.  She states she is just ready to go and she had spent way too much time here in the emergency department.  Patient was discharged stable condition.  Shelly Stevenson was evaluated in Emergency Department on 08/28/2018 for the symptoms described in the history of present illness. She was evaluated in the context of the global COVID-19 pandemic, which necessitated consideration that the patient might be at risk for infection with the SARS-CoV-2 virus that causes COVID-19. Institutional protocols and algorithms that pertain to the evaluation of patients at risk for COVID-19 are in a state of rapid change based on information released by regulatory bodies including the CDC and federal and state organizations. These policies and algorithms were followed during  the patient's care in the ED.      As part of my medical decision making, I reviewed the following data within the electronic MEDICAL RECORD NUMBER Nursing notes reviewed and incorporated, Old chart reviewed, Notes from prior ED visits and [place] Controlled Substance Database  ____________________________________________   FINAL CLINICAL IMPRESSION(S) / ED DIAGNOSES  Final diagnoses:  Contusion of foot, unspecified laterality, initial encounter  Educated About Covid-19 Virus Infection      NEW MEDICATIONS STARTED DURING THIS VISIT:  Discharge Medication List as of 08/28/2018  2:27 PM       Note:  This document was prepared using Dragon voice recognition software and may include unintentional dictation errors.    Faythe Ghee, PA-C 08/28/18 1529    Nita Sickle, MD 09/01/18 [phone]

## 2018-08-28 NOTE — Telephone Encounter (Unsigned)
Copied from CRM [id number]. Topic: Quick Communication - Rx Refill/Question >> Aug 28, 2018 12:38 PM Mcneil, Ja-Kwan wrote: Medication: amLODipine (NORVASC) 2.5 MG tablet  Has the patient contacted their pharmacy? no  Preferred Pharmacy (with phone number or street name): [facility] [address] ([address]), [place] - [address] [phone] (Phone)  [phone] (Fax)  Agent: Please be advised that RX refills may take up to 3 business days. We ask that you follow-up with your pharmacy.

## 2018-08-28 NOTE — ED Triage Notes (Signed)
Pt via pov from home with toe pain since yesterday when she hit it on a bed support. Pt also states she needs a covid test (asymptomatic) because her temp was 98.7 yesterday. Pt alert & oriented with NAD noted.

## 2018-09-02 ENCOUNTER — Other Ambulatory Visit: Payer: Self-pay

## 2018-09-02 ENCOUNTER — Ambulatory Visit (INDEPENDENT_AMBULATORY_CARE_PROVIDER_SITE_OTHER): Payer: BLUE CROSS/BLUE SHIELD | Admitting: Internal Medicine

## 2018-09-02 ENCOUNTER — Encounter: Payer: Self-pay | Admitting: Internal Medicine

## 2018-09-02 DIAGNOSIS — M79675 Pain in left toe(s): Secondary | ICD-10-CM

## 2018-09-02 MED ORDER — OXYCODONE-ACETAMINOPHEN 5-325 MG PO TABS: 1.0000 | ORAL_TABLET | Freq: Two times a day (BID) | ORAL | 0 refills | Status: DC | PRN

## 2018-09-02 MED ORDER — MELOXICAM 15 MG PO TABS: 15.0000 mg | ORAL_TABLET | Freq: Every day | ORAL | 2 refills | Status: DC | PRN

## 2018-09-02 NOTE — Patient Instructions (Signed)
Toe Fracture  A toe fracture is a break in one of the toe bones (phalanges). A toe fracture may be:  · A crack in the surface of the bone (stress fracture). This often occurs in athletes.  · A break all the way through the bone (complete fracture).  What are the causes?  This condition may be caused by:  · Direct impact, such as from dropping a heavy object on your toe.  · Stubbing your toe.  · Twisting or stretching your toe out of place.  · Overuse or repetitive exercise.  What increases the risk?  You are more likely to develop this condition if you:  · Play contact sports.  · Have a condition that causes the bones to become thin and brittle (osteoporosis).  · Have a low calcium level.  What are the signs or symptoms?  The main symptoms of this condition are swelling and pain in the toe. Other symptoms include:  · Bruising.  · Stiffness.  · Numbness.  · A change in the way the toe looks.  · Broken bones that poke through the skin.  · Blood beneath the toenail.  How is this diagnosed?  This condition is diagnosed with a physical exam. You may also have X-rays.  How is this treated?  Treatment for this condition depends on the type of fracture and its severity. Treatment may include:  · Taping the broken toe to a toe that is next to it (buddy taping). This is the most common treatment for fractures in which the bone has not moved out of place (non-displaced fracture).  · Wearing a shoe that has a wide, rigid sole to protect the toe and to limit its movement.  · Wearing a walking cast.  · Having a procedure to move the toe back into place.  · Surgery. This may be needed if the:  ? Pieces of broken bone are out of place (displaced).  ? Bone breaks through the skin.  · Physical therapy. This is done to help regain movement and strength in the toe.  You may need follow-up X-rays to make sure that the bone is healing well and staying in position.  Follow these instructions at home:  If you have a shoe:  · Wear the shoe  as told by your health care provider. Remove it only as told by your health care provider.  · Loosen the shoe if your toes tingle, become numb, or turn cold and blue.  · Keep the shoe clean and dry.  If you have a cast:  · Do not put pressure on any part of the cast until it is fully hardened. This may take several hours.  · Do not stick anything inside the cast to scratch your skin. Doing that increases your risk of infection.  · Check the skin around the cast every day. Tell your health care provider about any concerns.  · You may put lotion on dry skin around the edges of the cast. Do not put lotion on the skin underneath the cast.  · Keep the cast clean and dry.  Bathing  · Do not take baths, swim, or use a hot tub until your health care provider approves. Ask your health care provider if you can take showers.  · If the shoe or cast is not waterproof:  ? Do not let it get wet.  ? Cover it with a watertight covering when you take a bath or a shower.  Activity  ·   Do not use the injured foot to support your body weight until your health care provider says that you can. Use crutches as directed.  · Ask your health care provider:  ? What activities are safe for you during recovery.  ? What activities you need to avoid.  · Do physical therapy exercises as directed.  Driving  · Do not drive or use heavy machinery while taking pain medicine.  · Do not drive while wearing a cast on a foot that you use for driving.  Managing pain, stiffness, and swelling    · If directed, put ice on painful areas:  ? Put ice in a plastic bag.  ? Place a towel between your skin and the bag.  § If you have a shoe, remove it as told by your health care provider.  § If you have a cast, place a towel between your cast and the bag.  ? Leave the ice on for 20 minutes, 2-3 times per day.  · Raise (elevate) the injured area above the level of your heart while you are sitting or lying down.  General instructions  · If your toe was treated with  buddy taping, follow your health care provider's instructions for changing the gauze and tape. Change it more often if:  ? The gauze and tape get wet. If this happens, dry the space between the toes.  ? The gauze and tape are too tight and cause your toe to become pale or numb.  · If you were not given a protective shoe, wear sturdy, supportive shoes. Your shoes should not pinch your toes and should not fit tightly against your toes.  · Do not use any products that contain nicotine or tobacco, such as cigarettes and e-cigarettes. These can delay bone healing. If you need help quitting, ask your health care provider.  · Take over-the-counter and prescription medicines only as told by your health care provider.  · Keep all follow-up visits as told by your health care provider. This is important.  Contact a health care provider if you have:  · Pain that gets worse or does not get better with medicine.  · A fever.  · A bad smell coming from your cast.  Get help right away if you have:  · Any of the following in your toes or your foot:  ? Numbness that gets worse.  ? Tingling.  ? Coldness.  ? Blue skin.  · Redness or swelling that gets worse.  · Pain that suddenly becomes severe.  Summary   · A toe fracture is a break in one of the toe bones (phalanges).  · Treatment depends on how severe your fracture is and how the pieces of the broken bone line up with each other. Treatment may include buddy taping, wearing a shoe or a cast, or using crutches.  · Ice and elevate your foot to help lessen the pain and swelling.  This information is not intended to replace advice given to you by your health care provider. Make sure you discuss any questions you have with your health care provider.  Document Released: 03/15/2000 Document Revised: 07/01/2017 Document Reviewed: 04/29/2017  Elsevier Interactive Patient Education © 2019 [employer]vier Inc[employer]

## 2018-09-02 NOTE — Progress Notes (Signed)
Virtual Visit via Video Note  I connected with Shelly Stevenson   on 09/02/18 at  3:18 PM EDT by a video enabled telemedicine application and verified that I am speaking with the correct person using two identifiers.  Location patient: home Location provider:work Persons participating in the virtual visit: patient, provider  I discussed the limitations of evaluation and management by telemedicine and the availability of in person appointments. The patient expressed understanding and agreed to proceed.   HPI: 1. 08/27/18 she stumped her left pinky toe 5th toe on the bed which has 4x4 on the bottom of the bed to make the bed higher. 5/29 she went to ED for pain swelling and swelling of left foot. She left before Xray was done pain today is 8/10. She missed work today and needs note to be off work 6/3, 09/03/2018 she did work last Friday, Sat, "Sunday for 12 hours which made foot and toe pain worse due to walking on cement. She has broken a toe before on the other side and feels like this could be a broken toe and has tried to buddy tape the toes.   2. She is no longer c/w COVID just c/w covid due to mom being older and will not stay in the home she does not have covid sx's    ROS: See pertinent positives and negatives per HPI.  Past Medical History:  Diagnosis Date  . Anemia    with PICA sx's   . Anxiety   . Bacterial vaginosis   . Chicken pox   . Depression   . HSV (herpes simplex virus) infection   . Hypertension   . Vitamin D deficiency     Past Surgical History:  Procedure Laterality Date  . APPENDECTOMY     11"  y.o.   . AUGMENTATION MAMMAPLASTY Bilateral   . COLONOSCOPY WITH PROPOFOL N/A 05/02/2017   Procedure: COLONOSCOPY WITH PROPOFOL;  Surgeon: Pasty Spillersahiliani, Varnita B, MD;  Location: [facility] ENDOSCOPY;  Service: Endoscopy;  Laterality: N/A;  . FRACTURE SURGERY     Left thumb    Family History  Problem Relation Age of Onset  . Arthritis Mother   . Cancer Mother        breast  .  Hearing loss Mother   . Hypertension Mother   . Breast cancer Mother [id number]  . Arthritis Father   . Cancer Father        colon dx'ed age 5970s   . Hypertension Father   . Heart disease Father        MI  . Diabetes Father   . Mental illness Son        [facility]    SOCIAL HX:  Works in [employer] [place]  12 grade education  Enjoys yard work  1 dog  3 kids 2 boys and 1 girl and 1 grandson Mason   Current Outpatient Medications:  .  ALPRAZolam (XANAX) 0.5 MG tablet, Take 1 tablet (0.5 mg total) by mouth 2 (two) times daily as needed for anxiety., Disp: 30 tablet, Rfl: 2 .  amLODipine (NORVASC) 2.5 MG tablet, Take 1 tablet (2.5 mg total) by mouth daily., Disp: 90 tablet, Rfl: 0 .  Cholecalciferol 1.25 MG (50000 UT) capsule, Take 1 capsule (50,000 Units total) by mouth once a week., Disp: 13 capsule, Rfl: 1 .  clobetasol cream (TEMOVATE) 0.05 %, Apply 1 application topically 2 (two) times daily. To elbows and hands, Disp: 60 g, Rfl: 2 .  diclofenac (CATAFLAM) 50 MG  tablet, Take 1 tablet (50 mg total) by mouth 2 (two) times daily as needed., Disp: 180 tablet, Rfl: 1 .  mupirocin ointment (BACTROBAN) 2 %, Apply 1 application topically 2 (two) times daily. To both nostrils x 5 days, Disp: 30 g, Rfl: 0 .  phentermine (ADIPEX-P) 37.5 MG tablet, Take 1 tablet (37.5 mg total) by mouth daily before breakfast., Disp: 60 tablet, Rfl: 0 .  triamcinolone cream (KENALOG) 0.1 %, Bid elbows avoid face/groin/private, Disp: 454 g, Rfl: 1 .  venlafaxine XR (EFFEXOR-XR) 150 MG 24 hr capsule, Take 1 capsule (150 mg total) by mouth daily with breakfast., Disp: 90 capsule, Rfl: 3 .  zolpidem (AMBIEN CR) 6.25 MG CR tablet, Take 1 tablet (6.25 mg total) by mouth at bedtime as needed for sleep., Disp: 31 tablet, Rfl: 5 .  meloxicam (MOBIC) 15 MG tablet, Take 1 tablet (15 mg total) by mouth daily as needed for pain., Disp: 30 tablet, Rfl: 2 .  oxyCODONE-acetaminophen (PERCOCET) 5-325 MG tablet, Take 1 tablet by mouth 2  (two) times daily as needed for severe pain., Disp: 10 tablet, Rfl: 0  EXAM:  VITALS per patient if applicable:  GENERAL: alert, oriented, appears well and in no acute distress  HEENT: atraumatic, conjunttiva clear, no obvious abnormalities on inspection of external nose and ears  NECK: normal movements of the head and neck  LUNGS: on inspection no signs of respiratory distress, breathing rate appears normal, no obvious gross SOB, gasping or wheezing  CV: no obvious cyanosis  MS: moves all visible extremities without noticeable abnormality, ? Fracture left 5th toe   PSYCH/NEURO: pleasant and cooperative, no obvious depression or anxiety, speech and thought processing grossly intact  ASSESSMENT AND PLAN:  Discussed the following assessment and plan:  Pain of toe of left foot left 5th toe- Plan: oxyCODONE-acetaminophen (PERCOCET) 5-325 MG tablet, meloxicam (MOBIC) 15 MG tablet RICE If not better call back and will Xray foot to look at toe and consider podiatry referral  Note for work      I discussed the assessment and treatment plan with the patient. The patient was provided an opportunity to ask questions and all were answered. The patient agreed with the plan and demonstrated an understanding of the instructions.   The patient was advised to call back or seek an in-person evaluation if the symptoms worsen or if the condition fails to improve as anticipated.  Time spent 15 minutes  Bevelyn Buckles, MD

## 2018-09-07 ENCOUNTER — Telehealth: Payer: Self-pay | Admitting: *Deleted

## 2018-09-07 NOTE — Telephone Encounter (Signed)
Copied from [place] [id number]. Topic: General - Other >> Sep 07, 2018  7:12 AM [name] Stare wrote: Req appt with Olivia Mackie on 09/08/2018 after lunch

## 2018-09-08 ENCOUNTER — Ambulatory Visit (INDEPENDENT_AMBULATORY_CARE_PROVIDER_SITE_OTHER): Payer: BLUE CROSS/BLUE SHIELD | Admitting: Internal Medicine

## 2018-09-08 ENCOUNTER — Other Ambulatory Visit: Payer: Self-pay

## 2018-09-08 DIAGNOSIS — L409 Psoriasis, unspecified: Secondary | ICD-10-CM

## 2018-09-08 DIAGNOSIS — M2550 Pain in unspecified joint: Secondary | ICD-10-CM | POA: Diagnosis not present

## 2018-09-08 DIAGNOSIS — M5136 Other intervertebral disc degeneration, lumbar region: Secondary | ICD-10-CM

## 2018-09-08 DIAGNOSIS — M545 Low back pain: Secondary | ICD-10-CM

## 2018-09-08 DIAGNOSIS — M542 Cervicalgia: Secondary | ICD-10-CM | POA: Diagnosis not present

## 2018-09-08 DIAGNOSIS — K8020 Calculus of gallbladder without cholecystitis without obstruction: Secondary | ICD-10-CM | POA: Insufficient documentation

## 2018-09-08 DIAGNOSIS — G8929 Other chronic pain: Secondary | ICD-10-CM

## 2018-09-08 MED ORDER — CYCLOBENZAPRINE HCL 5 MG PO TABS: 5.0000 mg | ORAL_TABLET | Freq: Every evening | ORAL | 0 refills | Status: DC | PRN

## 2018-09-08 NOTE — Progress Notes (Signed)
Pre visit review using our clinic review tool, if applicable. No additional management support is needed unless otherwise documented below in the visit note. 

## 2018-09-09 ENCOUNTER — Other Ambulatory Visit: Payer: Self-pay

## 2018-09-09 ENCOUNTER — Encounter: Payer: Self-pay | Admitting: Internal Medicine

## 2018-09-09 ENCOUNTER — Ambulatory Visit (INDEPENDENT_AMBULATORY_CARE_PROVIDER_SITE_OTHER): Payer: BLUE CROSS/BLUE SHIELD

## 2018-09-09 DIAGNOSIS — M542 Cervicalgia: Secondary | ICD-10-CM | POA: Insufficient documentation

## 2018-09-09 NOTE — Progress Notes (Signed)
Virtual Visit via Video Note  I connected with Shelly Stevenson  on 09/08/18 at  4:15 PM EDT by a video enabled telemedicine application and verified that I am speaking with the correct person using two identifiers.  Location patient: home Location provider:work Persons participating in the virtual visit: patient, provider  I discussed the limitations of evaluation and management by telemedicine and the availability of in person appointments. The patient expressed understanding and agreed to proceed.   HPI: 1. C/o acute on chronic neck and low back pain h/o Xray low back with degenerative changes scoliosis. She reports pain 10/10 so painful at times caused nausea. Working and bending aggravates back pain so she has missed 6/8, 6/9 of work and needs a work note. She no longer has flexeril 10 mg given to her by her rheumatologist and needs a refill but 10 mg made her sleepy. She feels like she has knots esp in the right upper back and her boyfriend has tried to rub those out.  At times back and neck pain is so bad and painful it causes her to sweat. She reports reduced ROM in neck and back. She denies leg weakness, bowel/bladder incontinence, numbness or tingling but does have radiation of pain from her lower back to her hips R>L.  Back pain feels so bad she reports her entire body locked up therefore she is unable to go to work. She has tried to take 1/2 pill of percocet but physical activity worsens her back   Xray 07/30/17 FINDINGS: Levoconvex and rotary scoliosis centered at L2. There is associated mild multilevel degenerative disc disease and lower lumbar facet arthropathy involving L3-L4, L4-L5 and L5-S1. No evidence of acute fracture or malalignment. Prominent Schmorl's nodule in the superior endplate at L2. Rounded densities overlying the right upper quadrant likely represent peripherally calcified gallstones. No lytic or blastic osseous lesion. Unremarkable bowel gas  pattern.  IMPRESSION: 1. Mild rotary and levoconvex scoliosis of the lumbar spine centered at L2 with resultant mild but multilevel degenerative disc disease and moderate lower lumbar facet arthropathy. Cholelithiasis.  2. Toe pain is better since she stomped her toe  3. Psoriasis on b/l elbows    ROS: See pertinent positives and negatives per HPI.  Past Medical History:  Diagnosis Date  . Anemia    with PICA sx's   . Anxiety   . Bacterial vaginosis   . Chicken pox   . Depression   . HSV (herpes simplex virus) infection   . Hypertension   . Vitamin D deficiency     Past Surgical History:  Procedure Laterality Date  . APPENDECTOMY     53 y.o.   . AUGMENTATION MAMMAPLASTY Bilateral   . COLONOSCOPY WITH PROPOFOL N/A 05/02/2017   Procedure: COLONOSCOPY WITH PROPOFOL;  Surgeon: Pasty Spillersahiliani, Varnita B, MD;  Location: [facility] ENDOSCOPY;  Service: Endoscopy;  Laterality: N/A;  . FRACTURE SURGERY     Left thumb    Family History  Problem Relation Age of Onset  . Arthritis Mother   . Cancer Mother        breast  . Hearing loss Mother   . Hypertension Mother   . Breast cancer Mother [id number]  . Arthritis Father   . Cancer Father        colon dx'ed age 1670s   . Hypertension Father   . Heart disease Father        MI  . Diabetes Father   . Mental illness Son  AHDH    SOCIAL HX: lives with boyfriend    Current Outpatient Medications:  .  ALPRAZolam (XANAX) 0.5 MG tablet, Take 1 tablet (0.5 mg total) by mouth 2 (two) times daily as needed for anxiety., Disp: 30 tablet, Rfl: 2 .  amLODipine (NORVASC) 2.5 MG tablet, Take 1 tablet (2.5 mg total) by mouth daily., Disp: 90 tablet, Rfl: 0 .  Cholecalciferol 1.25 MG (50000 UT) capsule, Take 1 capsule (50,000 Units total) by mouth once a week., Disp: 13 capsule, Rfl: 1 .  clobetasol cream (TEMOVATE) 0.05 %, Apply 1 application topically 2 (two) times daily. To elbows and hands, Disp: 60 g, Rfl: 2 .  diclofenac (CATAFLAM) 50 MG  tablet, Take 1 tablet (50 mg total) by mouth 2 (two) times daily as needed., Disp: 180 tablet, Rfl: 1 .  meloxicam (MOBIC) 15 MG tablet, Take 1 tablet (15 mg total) by mouth daily as needed for pain., Disp: 30 tablet, Rfl: 2 .  mupirocin ointment (BACTROBAN) 2 %, Apply 1 application topically 2 (two) times daily. To both nostrils x 5 days, Disp: 30 g, Rfl: 0 .  oxyCODONE-acetaminophen (PERCOCET) 5-325 MG tablet, Take 1 tablet by mouth 2 (two) times daily as needed for severe pain., Disp: 10 tablet, Rfl: 0 .  phentermine (ADIPEX-P) 37.5 MG tablet, Take 1 tablet (37.5 mg total) by mouth daily before breakfast., Disp: 60 tablet, Rfl: 0 .  triamcinolone cream (KENALOG) 0.1 %, Bid elbows avoid face/groin/private, Disp: 454 g, Rfl: 1 .  venlafaxine XR (EFFEXOR-XR) 150 MG 24 hr capsule, Take 1 capsule (150 mg total) by mouth daily with breakfast., Disp: 90 capsule, Rfl: 3 .  zolpidem (AMBIEN CR) 6.25 MG CR tablet, Take 1 tablet (6.25 mg total) by mouth at bedtime as needed for sleep., Disp: 31 tablet, Rfl: 5 .  cyclobenzaprine (FLEXERIL) 5 MG tablet, Take 1 tablet (5 mg total) by mouth at bedtime as needed for muscle spasms., Disp: 90 tablet, Rfl: 0  EXAM:  VITALS per patient if applicable:  GENERAL: alert, oriented, appears well and in no acute distress  HEENT: atraumatic, conjunttiva clear, no obvious abnormalities on inspection of external nose and ears  NECK: normal movements of the head and neck  LUNGS: on inspection no signs of respiratory distress, breathing rate appears normal, no obvious gross SOB, gasping or wheezing  CV: no obvious cyanosis  MS: moves all visible extremities without noticeable abnormality  PSYCH/NEURO: pleasant and cooperative, no obvious depression or anxiety, speech and thought processing grossly intact  ASSESSMENT AND PLAN:  Discussed the following assessment and plan:  Chronic low back pain - Plan: MR Lumbar Spine Wo Contrast, Ambulatory referral to Physical  Medicine Rehab Dr. Yves Dillhasnis to consider steroid injection and PT for neck and back  Rx flexeril 5 mg qhs prn and back brace   Cervicalgia - Plan: DG Cervical Spine Complete, MR Lumbar Spine Wo Contrast, Ambulatory referral to Physical Medicine Rehab -given not for work   -will CC Dr. Renard MatterBock rheumatologist to see if she thinks back and neck issues could be related to her h/o psoriasis I.e psoriatic arthritsi      I discussed the assessment and treatment plan with the patient. The patient was provided an opportunity to ask questions and all were answered. The patient agreed with the plan and demonstrated an understanding of the instructions.   The patient was advised to call back or seek an in-person evaluation if the symptoms worsen or if the condition fails to improve as anticipated.  Time spent 25 minutes Delorise Jackson, MD

## 2018-09-09 NOTE — Patient Instructions (Signed)
Back Injury Prevention Back injuries can be very painful. They can also be difficult to heal. After having one back injury, you are more likely to have another one again. It is important to learn how to avoid injuring or re-injuring your back. The following tips can help you to prevent a back injury. What actions can I take to prevent back injuries? Changes in your diet Talk with your doctor about what to eat. Some foods can make the bones strong.  Talk with your doctor about how much calcium and vitamin D you need each day. These nutrients help to prevent weakening of the bones (osteoporosis).  Eat foods that have calcium. These include: ? Dairy products. ? Green leafy vegetables. ? Food and drinks that have had calcium added to them (fortified).  Eat foods that have vitamin D. These include: ? Milk. ? Food and drinks that have had vitamin D added to them.  Take other supplements and vitamins only as told by your doctor. Physical fitness Physical fitness makes your bones and muscles strong. It also improves your balance and strength.  Exercise for 30 minutes per day on most days of the week, or as told by your doctor. Make sure to: ? Do aerobic exercises, such as walking, jogging, biking, or swimming. ? Do exercises that increase balance and strength, such as tai chi and yoga. ? Do stretching exercises. This helps with flexibility. ? Develop strong belly (abdominal) muscles. Your belly muscles help to support your back.  Stay at a healthy weight. This lowers your risk of a back injury. Good posture        Prevent back injuries by developing and maintaining a good posture. To do this:  Sit up straight and stand up straight. Avoid leaning forward when you sit or hunching over when you stand.  Choose chairs that have good low-back (lumbar) support.  If you work at a desk: ? Sit close to it so you do not need to lean over. ? Keep your chin tucked in. ? Keep your neck drawn  back. ? Keep your elbows bent so that your arms make a corner (right angle).  When you drive: ? Sit high and close to the steering wheel. Add a low-back support to your car seat, if needed. ? Take breaks every hour if you are driving for long periods of time.  Avoid sitting or standing in one position for very long. Take breaks to get up, stretch, and walk around at least once every hour.  Sleep on your side with your knees slightly bent, or sleep on your back with a pillow under your knees.  Lifting, twisting, and reaching   Heavy lifting ? Avoid heavy lifting, especially lifting over and over again. If you must do heavy lifting: ? Stretch before lifting. ? Work slowly. ? Rest between lifts. ? Use a tool such as a cart or a dolly to move objects if one is available. ? Make several small trips instead of carrying one heavy load. ? Ask for help when you need it, especially when moving big objects. ? Follow these steps when lifting: ? Stand with your feet shoulder-width apart. ? Get as close to the object as you can. Do not pick up a heavy object that is far from your body. ? Use handles or lifting straps if they are available. ? Bend at your knees. Squat down, but keep your heels off the floor. ? Keep your shoulders back. Keep your chin tucked in. Keep  your back straight. ? Lift the object slowly while you tighten the muscles in your legs, belly, and bottom. Keep the object as close to the center of your body as possible. ? Follow these steps when putting down a heavy load: ? Stand with your feet shoulder-width apart. ? Lower the object slowly while you tighten the muscles in your legs, belly, and bottom. Keep the object as close to the center of your body as possible. ? Keep your shoulders back. Keep your chin tucked in. Keep your back straight. ? Bend at your knees. Squat down, but keep your heels off the floor. ? Use handles or lifting straps if they are available.  Twisting and  reaching ? Avoid lifting heavy objects above your waist. ? Do not twist at your waist while you are lifting or carrying a load. If you need to turn, move your feet. ? Do not bend over without bending at your knees. ? Avoid reaching over your head, across a table, or for an object on a high surface. Other things to do   Avoid wet floors and icy ground. Keep sidewalks clear of ice to prevent falls.  Do not sleep on a mattress that is too soft or too hard.  Store heavier objects on shelves at waist level.  Store lighter objects on lower or higher shelves.  Find ways to lower your stress, such as: ? Exercise. ? Massage. ? Relaxation techniques.  Talk with your doctor if you feel anxious or depressed. These conditions can make back pain worse.  Wear flat heel shoes with cushioned soles.  Use both shoulder straps when carrying a backpack.  Do not use any products that contain nicotine or tobacco, such as cigarettes and e-cigarettes. If you need help quitting, ask your doctor. Summary  Back injuries can be very painful and difficult to heal.  You can keep your back healthy by making certain changes. These include eating foods that make bones strong, working on being physically fit, developing a good posture, and lifting heavy objects in a safe way. This information is not intended to replace advice given to you by your health care provider. Make sure you discuss any questions you have with your health care provider. Document Released: 09/04/2007 Document Revised: 05/09/2017 Document Reviewed: 05/09/2017 Elsevier Interactive Patient Education  2019 [employer]  Back Exercises The following exercises strengthen the muscles that help to support the back. They also help to keep the lower back flexible. Doing these exercises can help to prevent back pain or lessen existing pain. If you have back pain or discomfort, try doing these exercises 2-3 times each day or as told by your health  care provider. When the pain goes away, do them once each day, but increase the number of times that you repeat the steps for each exercise (do more repetitions). If you do not have back pain or discomfort, do these exercises once each day or as told by your health care provider. Exercises Single Knee to Chest Repeat these steps 3-5 times for each leg: 1. Lie on your back on a firm bed or the floor with your legs extended. 2. Bring one knee to your chest. Your other leg should stay extended and in contact with the floor. 3. Hold your knee in place by grabbing your knee or thigh. 4. Pull on your knee until you feel a gentle stretch in your lower back. 5. Hold the stretch for 10-30 seconds. 6. Slowly release and straighten your leg.  Pelvic Tilt Repeat these steps 5-10 times: 1. Lie on your back on a firm bed or the floor with your legs extended. 2. Bend your knees so they are pointing toward the ceiling and your feet are flat on the floor. 3. Tighten your lower abdominal muscles to press your lower back against the floor. This motion will tilt your pelvis so your tailbone points up toward the ceiling instead of pointing to your feet or the floor. 4. With gentle tension and even breathing, hold this position for 5-10 seconds. Cat-Cow Repeat these steps until your lower back becomes more flexible: 1. Get into a hands-and-knees position on a firm surface. Keep your hands under your shoulders, and keep your knees under your hips. You may place padding under your knees for comfort. 2. Let your head hang down, and point your tailbone toward the floor so your lower back becomes rounded like the back of a cat. 3. Hold this position for 5 seconds. 4. Slowly lift your head and point your tailbone up toward the ceiling so your back forms a sagging arch like the back of a cow. 5. Hold this position for 5 seconds.  Press-Ups Repeat these steps 5-10 times: 1. Lie on your abdomen (face-down) on the floor.  2. Place your palms near your head, about shoulder-width apart. 3. While you keep your back as relaxed as possible and keep your hips on the floor, slowly straighten your arms to raise the top half of your body and lift your shoulders. Do not use your back muscles to raise your upper torso. You may adjust the placement of your hands to make yourself more comfortable. 4. Hold this position for 5 seconds while you keep your back relaxed. 5. Slowly return to lying flat on the floor.  Bridges Repeat these steps 10 times: 1. Lie on your back on a firm surface. 2. Bend your knees so they are pointing toward the ceiling and your feet are flat on the floor. 3. Tighten your buttocks muscles and lift your buttocks off of the floor until your waist is at almost the same height as your knees. You should feel the muscles working in your buttocks and the back of your thighs. If you do not feel these muscles, slide your feet 1-2 inches farther away from your buttocks. 4. Hold this position for 3-5 seconds. 5. Slowly lower your hips to the starting position, and allow your buttocks muscles to relax completely. If this exercise is too easy, try doing it with your arms crossed over your chest. Abdominal Crunches Repeat these steps 5-10 times: 1. Lie on your back on a firm bed or the floor with your legs extended. 2. Bend your knees so they are pointing toward the ceiling and your feet are flat on the floor. 3. Cross your arms over your chest. 4. Tip your chin slightly toward your chest without bending your neck. 5. Tighten your abdominal muscles and slowly raise your trunk (torso) high enough to lift your shoulder blades a tiny bit off of the floor. Avoid raising your torso higher than that, because it can put too much stress on your low back and it does not help to strengthen your abdominal muscles. 6. Slowly return to your starting position. Back Lifts Repeat these steps 5-10 times: 1. Lie on your abdomen  (face-down) with your arms at your sides, and rest your forehead on the floor. 2. Tighten the muscles in your legs and your buttocks. 3. Slowly lift your  chest off of the floor while you keep your hips pressed to the floor. Keep the back of your head in line with the curve in your back. Your eyes should be looking at the floor. 4. Hold this position for 3-5 seconds. 5. Slowly return to your starting position. Contact a health care provider if:  Your back pain or discomfort gets much worse when you do an exercise.  Your back pain or discomfort does not lessen within 2 hours after you exercise. If you have any of these problems, stop doing these exercises right away. Do not do them again unless your health care provider says that you can. Get help right away if:  You develop sudden, severe back pain. If this happens, stop doing the exercises right away. Do not do them again unless your health care provider says that you can. This information is not intended to replace advice given to you by your health care provider. Make sure you discuss any questions you have with your health care provider. Document Released: 04/25/2004 Document Revised: 07/22/2017 Document Reviewed: 05/12/2014 Elsevier Interactive Patient Education  2019 [employer]  Scoliosis  Scoliosis is a condition in which the spine curves sideways. Normally, the spine does not curve side-to-side (laterally). With scoliosis, the spine may curve to the left, to the right, or in both directions. The curve of the spine is measured by angles in degrees. Scoliosis can affect people at any age, but it is more common among children and adolescents. What are the causes? The cause of scoliosis is not always known. It may be caused by:  A birth defect.  A disease that can cause problems in the muscles or imbalance of the body, such as cerebral palsy or muscular dystrophy. What are the signs or symptoms? This condition may not cause any  symptoms. If you do have symptoms, they may include:  Leaning to one side.  Sunken chest and uneven shoulders.  One side of the body being different or larger than the other side (asymmetry).  An abnormal curve in the back.  Pain, which may limit physical activity.  Shortness of breath.  Bowel or bladder control problems, such as not knowing when you have to go. This can be a sign of nerve damage. How is this diagnosed? This condition is diagnosed based on:  Your medical history.  Your symptoms.  A physical exam. This may include: ? Examining your nerves, muscles, and reflexes (neurological exam). ? Testing the movement of your spine (range of motion study).  Imaging tests, such as: ? X-rays. ? MRI. How is this treated? Treatment for this condition depends on the severity of the symptoms. Treatment may include:  Observation to make sure that your scoliosis does not get worse (progress). You may need to have regular visits with your health care provider.  A back brace to prevent scoliosis from progressing. This may be needed during times of fast growth (growth spurts), such as during adolescence.  Medicine to help relieve pain.  Physical therapy.  Surgery. Follow these instructions at home: If you have a brace:  Wear the brace as told by your health care provider. Remove it only as told by your health care provider.  Loosen the brace if your fingers or toes tingle, become numb, or turn cold and blue.  Keep the brace clean.  If the brace is not waterproof: ? Do not let it get wet. ? Cover it with a watertight covering when you take a bath or  a shower. General instructions  Take over-the-counter and prescription medicines only as told by your health care provider.  Donot drive or use heavy machinery while taking prescription pain medicine.  If physical therapy was prescribed, do exercises as instructed.  Before starting any new sports or physical activities,  ask your health care provider whether they are safe for you.  Keep all follow-up visits as told by your health care provider. This is important. Contact a health care provider if you have:  Problems with your back brace, such as skin irritation or discomfort.  Back pain that does not get better with medicine. Get help right away if:  Your legs feel weak.  You cannot move your legs.  You cannot control when you urinate or pass stool (loss of bladder or bowel control). Summary  Scoliosis is a condition of having a spine that curves sideways. The spine may curve to the left, to the right, or in both directions.  This condition may be caused by birth defects or diseases that affect muscles and body balance.  Follow your health care provider's instructions about wearing a brace, doing physical activities, and keeping follow-up visits. This information is not intended to replace advice given to you by your health care provider. Make sure you discuss any questions you have with your health care provider. Document Released: 03/15/2000 Document Revised: 08/18/2017 Document Reviewed: 07/03/2017 Elsevier Interactive Patient Education  2019 [employer].

## 2018-09-10 ENCOUNTER — Ambulatory Visit (INDEPENDENT_AMBULATORY_CARE_PROVIDER_SITE_OTHER): Payer: BLUE CROSS/BLUE SHIELD | Admitting: Internal Medicine

## 2018-09-10 DIAGNOSIS — J309 Allergic rhinitis, unspecified: Secondary | ICD-10-CM | POA: Diagnosis not present

## 2018-09-10 DIAGNOSIS — R490 Dysphonia: Secondary | ICD-10-CM

## 2018-09-10 DIAGNOSIS — R768 Other specified abnormal immunological findings in serum: Secondary | ICD-10-CM

## 2018-09-10 DIAGNOSIS — L4050 Arthropathic psoriasis, unspecified: Secondary | ICD-10-CM | POA: Diagnosis not present

## 2018-09-10 DIAGNOSIS — M2550 Pain in unspecified joint: Secondary | ICD-10-CM | POA: Diagnosis not present

## 2018-09-10 MED ORDER — SALINE SPRAY 0.65 % NA SOLN: 2.0000 | NASAL | 12 refills | Status: DC | PRN

## 2018-09-10 MED ORDER — FLUTICASONE PROPIONATE 50 MCG/ACT NA SUSP: 2.0000 | Freq: Every day | NASAL | 6 refills | Status: DC

## 2018-09-10 NOTE — Progress Notes (Signed)
Virtual Visit via Video Note  I connected with Shelly Stevenson  on 09/10/18 at 11:41 AM EDT by a video enabled telemedicine application and verified that I am speaking with the correct person using two identifiers.  Location patient: home Location provider:work  Persons participating in the virtual visit: patient, provider  I discussed the limitations of evaluation and management by telemedicine and the availability of in person appointments. The patient expressed understanding and agreed to proceed.   HPI: 1. C/o neck, hips and low back pain worse with bending and worsening. She has tried Mobic 15 mg qd 1/2 pill percoet 5-325 mg qd prn. Pain is 9/10 She reports pain is affecting her mood  MRI is pending 09/11/18 11 am and pending appts with Dr. Sharlet Salina likely needs PT scheduled  She wants to be off of work 6 weeks due to feeling like working and bending is making her pain worse  2. Allergies worsening currently she takes allegra otc she tries sudafed a times to help  3. Hoarse voice agreeable to f/u with ENT   ROS: See pertinent positives and negatives per HPI.  Past Medical History:  Diagnosis Date  . Anemia    with PICA sx's   . Anxiety   . Bacterial vaginosis   . Chicken pox   . Depression   . HSV (herpes simplex virus) infection   . Hypertension   . Vitamin D deficiency     Past Surgical History:  Procedure Laterality Date  . APPENDECTOMY     53 y.o.   . AUGMENTATION MAMMAPLASTY Bilateral   . COLONOSCOPY WITH PROPOFOL N/A 05/02/2017   Procedure: COLONOSCOPY WITH PROPOFOL;  Surgeon: Virgel Manifold, MD;  Location: [facility] ENDOSCOPY;  Service: Endoscopy;  Laterality: N/A;  . FRACTURE SURGERY     Left thumb    Family History  Problem Relation Age of Onset  . Arthritis Mother   . Cancer Mother        breast  . Hearing loss Mother   . Hypertension Mother   . Breast cancer Mother 106  . Arthritis Father   . Cancer Father        colon dx'ed age 30s   . Hypertension  Father   . Heart disease Father        MI  . Diabetes Father   . Mental illness Son        [facility]    SOCIAL HX: lives with BF   Current Outpatient Medications:  .  ALPRAZolam (XANAX) 0.5 MG tablet, Take 1 tablet (0.5 mg total) by mouth 2 (two) times daily as needed for anxiety., Disp: 30 tablet, Rfl: 2 .  amLODipine (NORVASC) 2.5 MG tablet, Take 1 tablet (2.5 mg total) by mouth daily., Disp: 90 tablet, Rfl: 0 .  Cholecalciferol 1.25 MG (50000 UT) capsule, Take 1 capsule (50,000 Units total) by mouth once a week., Disp: 13 capsule, Rfl: 1 .  clobetasol cream (TEMOVATE) 7.85 %, Apply 1 application topically 2 (two) times daily. To elbows and hands, Disp: 60 g, Rfl: 2 .  cyclobenzaprine (FLEXERIL) 5 MG tablet, Take 1 tablet (5 mg total) by mouth at bedtime as needed for muscle spasms., Disp: 90 tablet, Rfl: 0 .  diclofenac (CATAFLAM) 50 MG tablet, Take 1 tablet (50 mg total) by mouth 2 (two) times daily as needed., Disp: 180 tablet, Rfl: 1 .  fluticasone (FLONASE) 50 MCG/ACT nasal spray, Place 2 sprays into both nostrils daily., Disp: 16 g, Rfl: 6 .  meloxicam (  MOBIC) 15 MG tablet, Take 1 tablet (15 mg total) by mouth daily as needed for pain., Disp: 30 tablet, Rfl: 2 .  mupirocin ointment (BACTROBAN) 2 %, Apply 1 application topically 2 (two) times daily. To both nostrils x 5 days, Disp: 30 g, Rfl: 0 .  oxyCODONE-acetaminophen (PERCOCET) 5-325 MG tablet, Take 1 tablet by mouth 2 (two) times daily as needed for severe pain., Disp: 10 tablet, Rfl: 0 .  phentermine (ADIPEX-P) 37.5 MG tablet, Take 1 tablet (37.5 mg total) by mouth daily before breakfast., Disp: 60 tablet, Rfl: 0 .  sodium chloride (OCEAN) 0.65 % SOLN nasal spray, Place 2 sprays into both nostrils as needed for congestion. Use 1st before flonase, Disp: 1 Bottle, Rfl: 12 .  triamcinolone cream (KENALOG) 0.1 %, Bid elbows avoid face/groin/private, Disp: 454 g, Rfl: 1 .  venlafaxine XR (EFFEXOR-XR) 150 MG 24 hr capsule, Take 1 capsule  (150 mg total) by mouth daily with breakfast., Disp: 90 capsule, Rfl: 3 .  zolpidem (AMBIEN CR) 6.25 MG CR tablet, Take 1 tablet (6.25 mg total) by mouth at bedtime as needed for sleep., Disp: 31 tablet, Rfl: 5  EXAM:  VITALS per patient if applicable:  GENERAL: alert, oriented, appears well and in no acute distress  HEENT: atraumatic, conjunttiva clear, no obvious abnormalities on inspection of external nose and ears  NECK: normal movements of the head and neck  LUNGS: on inspection no signs of respiratory distress, breathing rate appears normal, no obvious gross SOB, gasping or wheezing  CV: no obvious cyanosis  MS: moves all visible extremities without noticeable abnormality  PSYCH/NEURO: pleasant and cooperative, no obvious depression or anxiety, speech and thought processing grossly intact  ASSESSMENT AND PLAN:  Discussed the following assessment and plan:  Arthralgia, h/o psoriasis ? Psoriatic arthritis, +ANA- Plan: Ambulatory referral to Rheumatology Dr. Renard MatterBock ? If joint pain related to psoriatic arthritis vs degenerative changes noted on Xrays neck and low back  -will fill out short term/fmla paperwork when faxed here    Hoarseness of voice - Plan: Ambulatory referral to ENT further w/u   Allergic rhinitis, unspecified seasonality, unspecified trigger - Plan: fluticasone (FLONASE) 50 MCG/ACT nasal spray, sodium chloride (OCEAN) 0.65 % SOLN nasal spray, Ambulatory referral to ENT allergy testing, prn Allegra   Hm Fl[place] atwork10/1/19 utdTdap5/1/18 Had 3/3 hep B vaccines  -need to check titer in future   Pap 04/28/17 neg neg hpv +BV mammoneg4/9/19 negsch 10/16/2018 with implants  Colonoscopyhad 05/02/17 [facility] Referred to dermatology in pastDr. Roxan DieselIsensteinnot yet been  SeeingrheumatologyDr. Katherine BassetBocksaw today 05/21/18   [facility] form lipid, A1C, TSH, UA, vit D, urine  culture h/o hematuriasee result ntoe 05/17/2018    I discussed the assessment and treatment plan with the patient. The patient was provided an opportunity to ask questions and all were answered. The patient agreed with the plan and demonstrated an understanding of the instructions.   The patient was advised to call back or seek an in-person evaluation if the symptoms worsen or if the condition fails to improve as anticipated.  Time spent 15 minutes  [name], MD

## 2018-09-11 ENCOUNTER — Ambulatory Visit: Admission: RE | Admit: 2018-09-11 | Payer: BLUE CROSS/BLUE SHIELD | Source: Ambulatory Visit

## 2018-09-11 ENCOUNTER — Telehealth: Payer: Self-pay

## 2018-09-11 NOTE — Telephone Encounter (Signed)
Copied from CRM #[id number]. Topic: General - Other >> Sep 11, 2018  2:53 PM Davis, Karen A wrote: Reason for CRM: Karen from [facility] department called to say that patient was a no show for her appointment. Any questions please call  Ph # [phone],  Scheduling dept # [phone] 

## 2018-09-11 NOTE — Telephone Encounter (Signed)
I need them to fax FMLA to me

## 2018-09-11 NOTE — Telephone Encounter (Signed)
Call pt and reschedule MRI back   Thanks [place]

## 2018-09-14 ENCOUNTER — Telehealth: Payer: Self-pay

## 2018-09-14 NOTE — Telephone Encounter (Signed)
Copied from [place] [id number]. Topic: General - Other >> Sep 11, 2018  2:53 PM Leward Quan A wrote: Reason for CRM: Santiago Glad from [place] department called to say that patient was a no show for her appointment. Any questions please call  Ph # [phone],  Scheduling dept # [phone]

## 2018-09-14 NOTE — Telephone Encounter (Signed)
Please try to reschedule MRI lumbar  Call pt   Thanks [place]

## 2018-09-15 ENCOUNTER — Other Ambulatory Visit: Payer: Self-pay | Admitting: Internal Medicine

## 2018-09-15 DIAGNOSIS — I10 Essential (primary) hypertension: Secondary | ICD-10-CM

## 2018-09-15 MED ORDER — AMLODIPINE BESYLATE 2.5 MG PO TABS: 2.5000 mg | ORAL_TABLET | Freq: Every day | ORAL | 3 refills | Status: DC

## 2018-09-17 ENCOUNTER — Telehealth: Payer: Self-pay | Admitting: Internal Medicine

## 2018-09-17 ENCOUNTER — Other Ambulatory Visit: Payer: Self-pay | Admitting: Internal Medicine

## 2018-09-17 DIAGNOSIS — F419 Anxiety disorder, unspecified: Secondary | ICD-10-CM

## 2018-09-17 DIAGNOSIS — G4700 Insomnia, unspecified: Secondary | ICD-10-CM

## 2018-09-17 MED ORDER — ZOLPIDEM TARTRATE ER 6.25 MG PO TBCR: 6.2500 mg | EXTENDED_RELEASE_TABLET | Freq: Every evening | ORAL | 5 refills | Status: DC | PRN

## 2018-09-17 MED ORDER — ALPRAZOLAM 0.5 MG PO TABS: 0.5000 mg | ORAL_TABLET | Freq: Two times a day (BID) | ORAL | 2 refills | Status: DC | PRN

## 2018-09-17 NOTE — Telephone Encounter (Signed)
Medication Refill - Medication:   zolpidem (AMBIEN CR) 6.25 MG CR tablet [[id number]]   ALPRAZolam (XANAX) 0.5 MG tablet [[id number]]   Has the patient contacted their pharmacy yes   [facility] (N), [place] - [place] [phone] (Phone) [phone] (Fax)   .

## 2018-09-28 ENCOUNTER — Telehealth: Payer: Self-pay | Admitting: *Deleted

## 2018-09-28 NOTE — Telephone Encounter (Signed)
Copied from [place] [id number]. Topic: General - Other >> Sep 28, 2018 11:52 AM Yvette Rack wrote: Reason for CRM: Pt stated she lost her medications: amLODipine (NORVASC) 2.5 MG tablet and diclofenac (CATAFLAM) 50 MG tablet due to moving. Pt stated she would like for both Rx to be sent to [facility] (N), [place] - [place] [phone] (Phone) [phone] (Fax)

## 2018-09-28 NOTE — Telephone Encounter (Signed)
Pt lost her venlafaxine XR (EFFEXOR-XR) 150 MG 24 hr capsule As well and will need an early refill release due to losing while moving / please advise

## 2018-09-28 NOTE — Telephone Encounter (Signed)
Please call pharmacy and pt Advise pharmacy pt lost these 2 medications below so can they release refill early she has refills of both requested meds   Advise patient either to take Mobic/Meloxicam or Diclofenac (Cataflam) but not both   [place]

## 2018-09-29 ENCOUNTER — Ambulatory Visit: Payer: BLUE CROSS/BLUE SHIELD | Admitting: Internal Medicine

## 2018-09-29 NOTE — Telephone Encounter (Signed)
Call her pharmacy for meds she lost to see if they will give them to her and call pt   [facility]

## 2018-09-30 ENCOUNTER — Other Ambulatory Visit: Payer: Self-pay | Admitting: Internal Medicine

## 2018-09-30 DIAGNOSIS — I10 Essential (primary) hypertension: Secondary | ICD-10-CM

## 2018-09-30 NOTE — Telephone Encounter (Signed)
She will need a new RX

## 2018-10-01 NOTE — Telephone Encounter (Signed)
Called pharmacy they have refills   [place]

## 2018-10-23 ENCOUNTER — Ambulatory Visit: Payer: BLUE CROSS/BLUE SHIELD | Admitting: Internal Medicine

## 2018-11-03 ENCOUNTER — Encounter: Payer: Self-pay | Admitting: Internal Medicine

## 2018-11-03 ENCOUNTER — Ambulatory Visit (INDEPENDENT_AMBULATORY_CARE_PROVIDER_SITE_OTHER): Payer: BC Managed Care – PPO | Admitting: Internal Medicine

## 2018-11-03 ENCOUNTER — Other Ambulatory Visit: Payer: Self-pay

## 2018-11-03 VITALS — Ht 65.0 in | Wt 165.0 lb

## 2018-11-03 DIAGNOSIS — M79672 Pain in left foot: Secondary | ICD-10-CM

## 2018-11-03 DIAGNOSIS — M25572 Pain in left ankle and joints of left foot: Secondary | ICD-10-CM

## 2018-11-03 DIAGNOSIS — M5442 Lumbago with sciatica, left side: Secondary | ICD-10-CM

## 2018-11-03 DIAGNOSIS — G8929 Other chronic pain: Secondary | ICD-10-CM | POA: Diagnosis not present

## 2018-11-03 NOTE — Progress Notes (Signed)
Virtual Visit via Video Note  I connected with Shelly Stevenson   on 11/03/18 at  1:30 PM EDT by a video enabled telemedicine application and verified that I am speaking with the correct person using two identifiers.  Location patient: home Location provider:work or home office Persons participating in the virtual visit: patient, provider  I discussed the limitations of evaluation and management by telemedicine and the availability of in person appointments. The patient expressed understanding and agreed to proceed.   HPI: 1. C/o chronic left ankle foot pain and swelling since 08/28/18 she never had an Xray of the area but hurts to walk 5/10 and she missed work today and needs a note. She is taking Diclofenac and Tylenol as needed pain kept her up last night and she had trouble sleeping. She has tried elevation, compression, rest and will try ice  2. Chronic low back pain missed appt for MRI and wants to reschedule Back pain radiates to left hip    ROS: See pertinent positives and negatives per HPI.  Past Medical History:  Diagnosis Date  . Anemia    with PICA sx's   . Anxiety   . Bacterial vaginosis   . Chicken pox   . Depression   . HSV (herpes simplex virus) infection   . Hypertension   . Vitamin D deficiency     Past Surgical History:  Procedure Laterality Date  . APPENDECTOMY     53 y.o.   . AUGMENTATION MAMMAPLASTY Bilateral   . COLONOSCOPY WITH PROPOFOL N/A 05/02/2017   Procedure: COLONOSCOPY WITH PROPOFOL;  Surgeon: Pasty Spillersahiliani, Varnita B, MD;  Location: [facility] ENDOSCOPY;  Service: Endoscopy;  Laterality: N/A;  . FRACTURE SURGERY     Left thumb    Family History  Problem Relation Age of Onset  . Arthritis Mother   . Cancer Mother        breast  . Hearing loss Mother   . Hypertension Mother   . Breast cancer Mother [id number]  . Arthritis Father   . Cancer Father        colon dx'ed age 6470s   . Hypertension Father   . Heart disease Father        MI  . Diabetes Father   .  Mental illness Son        [facility]    SOCIAL HX: Works in [employer] [place] [employer] person is Connye BurkittMelissa Holmes  12 grade education  Enjoys yard work  1 dog  3 kids 2 boys and 1 girl and 1 grandson Mason   Current Outpatient Medications:  .  ALPRAZolam (XANAX) 0.5 MG tablet, Take 1 tablet (0.5 mg total) by mouth 2 (two) times daily as needed for anxiety., Disp: 30 tablet, Rfl: 2 .  amLODipine (NORVASC) 2.5 MG tablet, Take 1 tablet (2.5 mg total) by mouth daily., Disp: 90 tablet, Rfl: 3 .  Cholecalciferol 1.25 MG (50000 UT) capsule, Take 1 capsule (50,000 Units total) by mouth once a week., Disp: 13 capsule, Rfl: 1 .  clobetasol cream (TEMOVATE) 0.05 %, Apply 1 application topically 2 (two) times daily. To elbows and hands, Disp: 60 g, Rfl: 2 .  cyclobenzaprine (FLEXERIL) 5 MG tablet, Take 1 tablet (5 mg total) by mouth at bedtime as needed for muscle spasms., Disp: 90 tablet, Rfl: 0 .  diclofenac (CATAFLAM) 50 MG tablet, Take 1 tablet (50 mg total) by mouth 2 (two) times daily as needed., Disp: 180 tablet, Rfl: 1 .  fluticasone (FLONASE) 50  MCG/ACT nasal spray, Place 2 sprays into both nostrils daily., Disp: 16 g, Rfl: 6 .  meloxicam (MOBIC) 15 MG tablet, Take 1 tablet (15 mg total) by mouth daily as needed for pain., Disp: 30 tablet, Rfl: 2 .  mupirocin ointment (BACTROBAN) 2 %, Apply 1 application topically 2 (two) times daily. To both nostrils x 5 days, Disp: 30 g, Rfl: 0 .  oxyCODONE-acetaminophen (PERCOCET) 5-325 MG tablet, Take 1 tablet by mouth 2 (two) times daily as needed for severe pain., Disp: 10 tablet, Rfl: 0 .  phentermine (ADIPEX-P) 37.5 MG tablet, Take 1 tablet (37.5 mg total) by mouth daily before breakfast., Disp: 60 tablet, Rfl: 0 .  sodium chloride (OCEAN) 0.65 % SOLN nasal spray, Place 2 sprays into both nostrils as needed for congestion. Use 1st before flonase, Disp: 1 Bottle, Rfl: 12 .  triamcinolone cream (KENALOG) 0.1 %, Bid elbows avoid face/groin/private,  Disp: 454 g, Rfl: 1 .  venlafaxine XR (EFFEXOR-XR) 150 MG 24 hr capsule, Take 1 capsule (150 mg total) by mouth daily with breakfast., Disp: 90 capsule, Rfl: 3 .  zolpidem (AMBIEN CR) 6.25 MG CR tablet, Take 1 tablet (6.25 mg total) by mouth at bedtime as needed for sleep., Disp: 31 tablet, Rfl: 5  EXAM:  VITALS per patient if applicable:  GENERAL: alert, oriented, appears well and in no acute distress  HEENT: atraumatic, conjunttiva clear, no obvious abnormalities on inspection of external nose and ears  NECK: normal movements of the head and neck  LUNGS: on inspection no signs of respiratory distress, breathing rate appears normal, no obvious gross SOB, gasping or wheezing  CV: no obvious cyanosis  MS: moves all visible extremities without noticeable abnormality, left lateral malleolus is slightly swollen   PSYCH/NEURO: pleasant and cooperative, no obvious depression or anxiety, speech and thought processing grossly intact  ASSESSMENT AND PLAN:  Discussed the following assessment and plan:  Left foot pain - Plan: DG Foot Complete Left, DG Ankle Complete Left  Chronic pain of left ankle - Plan: DG Foot Complete Left, DG Ankle Complete Left -rec RICE and if not better will need to see podiatry vs ortho   Chronic left-sided low back pain with left-sided sciatica - Plan: try to schedule Mri L spine Weds/Thursday this week      I discussed the assessment and treatment plan with the patient. The patient was provided an opportunity to ask questions and all were answered. The patient agreed with the plan and demonstrated an understanding of the instructions.   The patient was advised to call back or seek an in-person evaluation if the symptoms worsen or if the condition fails to improve as anticipated.  Time spent 15 minutes  Delorise Jackson, MD

## 2018-11-12 ENCOUNTER — Ambulatory Visit: Payer: BC Managed Care – PPO | Admitting: Internal Medicine

## 2018-11-13 ENCOUNTER — Other Ambulatory Visit: Payer: Self-pay

## 2018-11-13 ENCOUNTER — Ambulatory Visit (INDEPENDENT_AMBULATORY_CARE_PROVIDER_SITE_OTHER): Payer: BC Managed Care – PPO | Admitting: Internal Medicine

## 2018-11-13 DIAGNOSIS — M25572 Pain in left ankle and joints of left foot: Secondary | ICD-10-CM | POA: Diagnosis not present

## 2018-11-13 DIAGNOSIS — M25552 Pain in left hip: Secondary | ICD-10-CM

## 2018-11-13 DIAGNOSIS — G8929 Other chronic pain: Secondary | ICD-10-CM

## 2018-11-13 DIAGNOSIS — M79672 Pain in left foot: Secondary | ICD-10-CM

## 2018-11-13 DIAGNOSIS — M5416 Radiculopathy, lumbar region: Secondary | ICD-10-CM

## 2018-11-13 MED ORDER — GABAPENTIN 300 MG PO CAPS: 300.0000 mg | ORAL_CAPSULE | Freq: Every day | ORAL | 0 refills | Status: DC

## 2018-11-13 NOTE — Progress Notes (Signed)
Telephone Note  I connected with Shelly Stevenson  on 11/16/18 at  2:45 PM EDT telephone and verified that I am speaking with the correct person using two identifiers.  Location patient: home Location provider:work or home office Persons participating in the virtual visit: patient, provider  I discussed the limitations of evaluation and management by telemedicine and the availability of in person appointments. The patient expressed understanding and agreed to proceed.   HPI: 1. C/o left ankle and foot pain still she missed work 8/12 and 8/14 due to pain as well as left hip pain and low back pain. Pain is moderate to severe and tried tylenol and either diclofenac or mobic but advised not to take both together which she is not doing   -she needs a note for work 2. MRI low back set up 8/19 3. Chronic pain also advised pt to f/u with Dr. Renard MatterBock with h/o psoriasis Dr. Renard MatterBock wanted to start medications for c/w psoriatic arthritis but pt never f/u  4. Anxiety-the above are worsening her anxiety and making her feel down       ROS: See pertinent positives and negatives per HPI.  Past Medical History:  Diagnosis Date  . Anemia    with PICA sx's   . Anxiety   . Bacterial vaginosis   . Chicken pox   . Depression   . HSV (herpes simplex virus) infection   . Hypertension   . Vitamin D deficiency     Past Surgical History:  Procedure Laterality Date  . APPENDECTOMY     53 y.o.   . AUGMENTATION MAMMAPLASTY Bilateral   . COLONOSCOPY WITH PROPOFOL N/A 05/02/2017   Procedure: COLONOSCOPY WITH PROPOFOL;  Surgeon: Pasty Spillersahiliani, Varnita B, MD;  Location: [facility] ENDOSCOPY;  Service: Endoscopy;  Laterality: N/A;  . FRACTURE SURGERY     Left thumb    Family History  Problem Relation Age of Onset  . Arthritis Mother   . Cancer Mother        breast  . Hearing loss Mother   . Hypertension Mother   . Breast cancer Mother [id number]  . Arthritis Father   . Cancer Father        colon dx'ed age 5470s   .  Hypertension Father   . Heart disease Father        MI  . Diabetes Father   . Mental illness Son        [facility]    SOCIAL HX: lives with boyfriend    Current Outpatient Medications:  .  ALPRAZolam (XANAX) 0.5 MG tablet, Take 1 tablet (0.5 mg total) by mouth 2 (two) times daily as needed for anxiety., Disp: 30 tablet, Rfl: 2 .  amLODipine (NORVASC) 2.5 MG tablet, Take 1 tablet (2.5 mg total) by mouth daily., Disp: 90 tablet, Rfl: 3 .  Cholecalciferol 1.25 MG (50000 UT) capsule, Take 1 capsule (50,000 Units total) by mouth once a week., Disp: 13 capsule, Rfl: 1 .  clobetasol cream (TEMOVATE) 0.05 %, Apply 1 application topically 2 (two) times daily. To elbows and hands, Disp: 60 g, Rfl: 2 .  cyclobenzaprine (FLEXERIL) 5 MG tablet, Take 1 tablet (5 mg total) by mouth at bedtime as needed for muscle spasms., Disp: 90 tablet, Rfl: 0 .  diclofenac (CATAFLAM) 50 MG tablet, Take 1 tablet (50 mg total) by mouth 2 (two) times daily as needed., Disp: 180 tablet, Rfl: 1 .  fluticasone (FLONASE) 50 MCG/ACT nasal spray, Place 2 sprays into both nostrils daily., Disp:  16 g, Rfl: 6 .  mupirocin ointment (BACTROBAN) 2 %, Apply 1 application topically 2 (two) times daily. To both nostrils x 5 days, Disp: 30 g, Rfl: 0 .  oxyCODONE-acetaminophen (PERCOCET) 5-325 MG tablet, Take 1 tablet by mouth 2 (two) times daily as needed for severe pain., Disp: 10 tablet, Rfl: 0 .  phentermine (ADIPEX-P) 37.5 MG tablet, Take 1 tablet (37.5 mg total) by mouth daily before breakfast., Disp: 60 tablet, Rfl: 0 .  sodium chloride (OCEAN) 0.65 % SOLN nasal spray, Place 2 sprays into both nostrils as needed for congestion. Use 1st before flonase, Disp: 1 Bottle, Rfl: 12 .  triamcinolone cream (KENALOG) 0.1 %, Bid elbows avoid face/groin/private, Disp: 454 g, Rfl: 1 .  venlafaxine XR (EFFEXOR-XR) 150 MG 24 hr capsule, Take 1 capsule (150 mg total) by mouth daily with breakfast., Disp: 90 capsule, Rfl: 3 .  zolpidem (AMBIEN CR) 6.25 MG  CR tablet, Take 1 tablet (6.25 mg total) by mouth at bedtime as needed for sleep., Disp: 31 tablet, Rfl: 5 .  gabapentin (NEURONTIN) 300 MG capsule, Take 1 capsule (300 mg total) by mouth at bedtime., Disp: 30 capsule, Rfl: 0  EXAM:  VITALS per patient if applicable:  GENERAL: alert, oriented, appears well and in no acute distress  MS: +subjective left foot pain and ankle, low back pain and left hip pain+  PSYCH/NEURO: pleasant and cooperative, no obvious depression or anxiety, speech and thought processing grossly intact  ASSESSMENT AND PLAN:  Discussed the following assessment and plan:  Lumbar radiculopathy - Plan: gabapentin (NEURONTIN) 300 MG capsule, MRI low back pending -prn Diclofenac not with mobic stop mobic  -prn Tylenol  -given note for work missed 8/12 and 11/13/18   Left hip pain - Plan: pending MRI L spine to see if could be referred   Chronic pain of left ankle Left foot pain - Plan: -Xrays in encouraged pt to get Xrays done She may need to see podiatry in the future   With chronic arthralgia as well h/o psoriasis and c/w psoriatic arthritis encouraged pt to make f/u appt with rheumatology Dr. Meda Coffee   Also rec pt try to go to work even when in pain she serially misses work due to chronic pain and I am c/w with her serial absentees encouraged to go to work    I discussed the assessment and treatment plan with the patient. The patient was provided an opportunity to ask questions and all were answered. The patient agreed with the plan and demonstrated an understanding of the instructions.   The patient was advised to call back or seek an in-person evaluation if the symptoms worsen or if the condition fails to improve as anticipated.  Time spent 15 minutes  Delorise Jackson, MD

## 2018-11-16 ENCOUNTER — Encounter: Payer: Self-pay | Admitting: Internal Medicine

## 2018-11-16 DIAGNOSIS — M79672 Pain in left foot: Secondary | ICD-10-CM | POA: Insufficient documentation

## 2018-11-16 DIAGNOSIS — M25572 Pain in left ankle and joints of left foot: Secondary | ICD-10-CM | POA: Insufficient documentation

## 2018-11-16 DIAGNOSIS — G8929 Other chronic pain: Secondary | ICD-10-CM | POA: Insufficient documentation

## 2018-11-16 DIAGNOSIS — M25552 Pain in left hip: Secondary | ICD-10-CM | POA: Insufficient documentation

## 2018-11-18 ENCOUNTER — Ambulatory Visit: Admission: RE | Admit: 2018-11-18 | Payer: BC Managed Care – PPO | Source: Ambulatory Visit

## 2018-12-15 ENCOUNTER — Other Ambulatory Visit: Payer: Self-pay | Admitting: Internal Medicine

## 2018-12-15 ENCOUNTER — Ambulatory Visit: Payer: BLUE CROSS/BLUE SHIELD | Admitting: Internal Medicine

## 2018-12-15 DIAGNOSIS — I10 Essential (primary) hypertension: Secondary | ICD-10-CM

## 2018-12-15 DIAGNOSIS — F419 Anxiety disorder, unspecified: Secondary | ICD-10-CM

## 2018-12-15 DIAGNOSIS — G4700 Insomnia, unspecified: Secondary | ICD-10-CM

## 2018-12-15 DIAGNOSIS — M542 Cervicalgia: Secondary | ICD-10-CM

## 2018-12-15 NOTE — Telephone Encounter (Signed)
Medication Refill - Medication: zolpidem (AMBIEN CR) 6.25 MG CR tablet + Vitamin D  ALPRAZolam (XANAX) 0.5 MG tablet amLODipine (NORVASC) 2.5 MG tablet diclofenac (CATAFLAM) 50 MG tablet  venlafaxine XR (EFFEXOR-XR) 150 MG 24 hr capsule  Requesting 90 day supply   Has the patient contacted their pharmacy? Yes.   (Agent: If no, request that the patient contact the pharmacy for the refill.) (Agent: If yes, when and what did the pharmacy advise?)  Preferred Pharmacy (with phone number or street name):  [facility] (N), [place] - [place] ([place]) [place] [address]  Phone: [phone] Fax: [phone]     Agent: Please be advised that RX refills may take up to 3 business days. We ask that you follow-up with your pharmacy.

## 2018-12-15 NOTE — Telephone Encounter (Signed)
Requested medication (s) are due for refill today: yes  Requested medication (s) are on the active medication list: yes  Last refill:  09/17/2018  Future visit scheduled: yes  Notes to clinic: refill cannot be delegated   Requested Prescriptions  Pending Prescriptions Disp Refills   zolpidem (AMBIEN CR) 6.25 MG CR tablet 31 tablet 5    Sig: Take 1 tablet (6.25 mg total) by mouth at bedtime as needed for sleep.     Not Delegated - Psychiatry:  Anxiolytics/Hypnotics Failed - 12/15/2018 11:25 AM      Failed - This refill cannot be delegated      Failed - Urine Drug Screen completed in last 360 days.      Passed - Valid encounter within last 6 months    Recent Outpatient Visits          1 month ago Lumbar radiculopathy   [facility] [place] McLean-Scocuzza, Pasty Spillersracy N, MD   1 month ago Left foot pain   [facility] [place] McLean-Scocuzza, Pasty Spillersracy N, MD   3 months ago Arthralgia, unspecified joint   [facility] [place] McLean-Scocuzza, Pasty Spillersracy N, MD   3 months ago Chronic low back pain, unspecified back pain laterality, unspecified whether sciatica present   [facility] [place] McLean-Scocuzza, Pasty Spillersracy N, MD   3 months ago Pain of toe of left foot   [facility] [place] McLean-Scocuzza, Pasty Spillersracy N, MD      Future Appointments            Tomorrow McLean-Scocuzza, Pasty Spillersracy N, MD [facility] [place], [facility]            diclofenac (CATAFLAM) 50 MG tablet 180 tablet 1    Sig: Take 1 tablet (50 mg total) by mouth 2 (two) times daily as needed.     Analgesics:  NSAIDS Failed - 12/15/2018 11:25 AM      Failed - Cr in normal range and within 360 days    Creat  Date Value Ref Range Status  03/24/2017 0.75 0.50 - 1.05 mg/dL Final    Comment:    For patients >53 years of age, the reference limit for Creatinine is approximately 13% higher for people identified as African-American. .          Failed - HGB in normal range and  within 360 days    Hemoglobin  Date Value Ref Range Status  03/24/2017 14.0 11.7 - 15.5 g/dL Final         Passed - Patient is not pregnant      Passed - Valid encounter within last 12 months    Recent Outpatient Visits          1 month ago Lumbar radiculopathy   [facility] [place] McLean-Scocuzza, Pasty Spillersracy N, MD   1 month ago Left foot pain   [facility] [place] McLean-Scocuzza, Pasty Spillersracy N, MD   3 months ago Arthralgia, unspecified joint   [facility] [place] McLean-Scocuzza, Pasty Spillersracy N, MD   3 months ago Chronic low back pain, unspecified back pain laterality, unspecified whether sciatica present   [facility] [place] McLean-Scocuzza, Pasty Spillersracy N, MD   3 months ago Pain of toe of left foot   [facility] [place] McLean-Scocuzza, Pasty Spillersracy N, MD      Future Appointments            Tomorrow McLean-Scocuzza, Pasty Spillersracy N, MD [facility] [place]  amLODipine (NORVASC) 2.5 MG tablet 90 tablet 3    Sig: Take 1 tablet (2.5 mg total) by mouth daily.     Cardiovascular:  Calcium Channel Blockers Failed - 12/15/2018 11:25 AM      Failed - Last BP in normal range    BP Readings from Last 1 Encounters:  08/28/18 (!) 137/98         Passed - Valid encounter within last 6 months    Recent Outpatient Visits          1 month ago Lumbar radiculopathy   [place] McLean-Scocuzza, Nino Glow, MD   1 month ago Left foot pain   [place] McLean-Scocuzza, Nino Glow, MD   3 months ago Arthralgia, unspecified joint   [place] McLean-Scocuzza, Nino Glow, MD   3 months ago Chronic low back pain, unspecified back pain laterality, unspecified whether sciatica present   [place] McLean-Scocuzza, Nino Glow, MD   3 months ago Pain of toe of left foot   [place], MD      Future Appointments             Tomorrow McLean-Scocuzza, Nino Glow, MD [place], [facility]            ALPRAZolam Duanne Moron) 0.5 MG tablet 30 tablet 2    Sig: Take 1 tablet (0.5 mg total) by mouth 2 (two) times daily as needed for anxiety.     Not Delegated - Psychiatry:  Anxiolytics/Hypnotics Failed - 12/15/2018 11:25 AM      Failed - This refill cannot be delegated      Failed - Urine Drug Screen completed in last 360 days.      Passed - Valid encounter within last 6 months    Recent Outpatient Visits          1 month ago Lumbar radiculopathy   [place] McLean-Scocuzza, Nino Glow, MD   1 month ago Left foot pain   [facility] McLean-Scocuzza, Nino Glow, MD   3 months ago Arthralgia, unspecified joint   [place] McLean-Scocuzza, Nino Glow, MD   3 months ago Chronic low back pain, unspecified back pain laterality, unspecified whether sciatica present   [place] McLean-Scocuzza, Nino Glow, MD   3 months ago Pain of toe of left foot   [place] McLean-Scocuzza, Nino Glow, MD      Future Appointments            Tomorrow McLean-Scocuzza, Nino Glow, MD [place], [facility]

## 2018-12-16 ENCOUNTER — Other Ambulatory Visit: Payer: Self-pay

## 2018-12-16 ENCOUNTER — Ambulatory Visit (INDEPENDENT_AMBULATORY_CARE_PROVIDER_SITE_OTHER): Payer: BC Managed Care – PPO | Admitting: Internal Medicine

## 2018-12-16 DIAGNOSIS — L409 Psoriasis, unspecified: Secondary | ICD-10-CM

## 2018-12-16 DIAGNOSIS — F339 Major depressive disorder, recurrent, unspecified: Secondary | ICD-10-CM

## 2018-12-16 DIAGNOSIS — M2550 Pain in unspecified joint: Secondary | ICD-10-CM

## 2018-12-16 DIAGNOSIS — Z780 Asymptomatic menopausal state: Secondary | ICD-10-CM

## 2018-12-16 DIAGNOSIS — G4700 Insomnia, unspecified: Secondary | ICD-10-CM

## 2018-12-16 DIAGNOSIS — F419 Anxiety disorder, unspecified: Secondary | ICD-10-CM

## 2018-12-16 DIAGNOSIS — M5416 Radiculopathy, lumbar region: Secondary | ICD-10-CM

## 2018-12-16 DIAGNOSIS — M79672 Pain in left foot: Secondary | ICD-10-CM

## 2018-12-16 DIAGNOSIS — M25552 Pain in left hip: Secondary | ICD-10-CM

## 2018-12-16 DIAGNOSIS — I10 Essential (primary) hypertension: Secondary | ICD-10-CM

## 2018-12-16 DIAGNOSIS — R490 Dysphonia: Secondary | ICD-10-CM

## 2018-12-16 NOTE — Progress Notes (Signed)
Virtual Visit via Video Note  I connected with Shelly Stevenson  on 12/22/18 at  4:00 PM EDT by a video enabled telemedicine application and verified that I am speaking with the correct person using two identifiers.  Location patient: home Location provider:work or home office Persons participating in the virtual visit: patient, provider  I discussed the limitations of evaluation and management by telemedicine and the availability of in person appointments. The patient expressed understanding and agreed to proceed.   HPI: 1. Still c/o left foot and back pain and lef thip pain no imaging done as of yet and she watns to resch MRI for 01/2019  -she does c/o left leg/foot numbness  -also advised she f/u with Dr. Renard MatterBock for MSK pains as she has psoriasis and could be related agreeable to see 01/2019  2. Hoarseness in voice still has not seen ENT comes and goes     ROS: See pertinent positives and negatives per HPI.  Past Medical History:  Diagnosis Date  . Anemia    with PICA sx's   . Anxiety   . Bacterial vaginosis   . Chicken pox   . Depression   . HSV (herpes simplex virus) infection   . Hypertension   . Vitamin D deficiency     Past Surgical History:  Procedure Laterality Date  . APPENDECTOMY     53 y.o.   . AUGMENTATION MAMMAPLASTY Bilateral   . COLONOSCOPY WITH PROPOFOL N/A 05/02/2017   Procedure: COLONOSCOPY WITH PROPOFOL;  Surgeon: Pasty Spillersahiliani, Varnita B, MD;  Location: [facility] ENDOSCOPY;  Service: Endoscopy;  Laterality: N/A;  . FRACTURE SURGERY     Left thumb    Family History  Problem Relation Age of Onset  . Arthritis Mother   . Cancer Mother        breast  . Hearing loss Mother   . Hypertension Mother   . Breast cancer Mother [id number]  . Arthritis Father   . Cancer Father        colon dx'ed age 10970s   . Hypertension Father   . Heart disease Father        MI  . Diabetes Father   . 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  ALPRAZolam (XANAX) 0.5 MG tablet, Take 1 tablet (0.5 mg total) by mouth 2 (two) times daily as needed for anxiety., Disp: 30 tablet, Rfl: 5 .  amLODipine (NORVASC) 2.5 MG tablet, Take 1 tablet (2.5 mg total) by mouth daily., Disp: 90 tablet, Rfl: 3 .  Cholecalciferol 1.25 MG (50000 UT) capsule, Take 1 capsule (50,000 Units total) by mouth once a week., Disp: 13 capsule, Rfl: 1 .  clobetasol cream (TEMOVATE) 0.05 %, Apply 1 application topically 2 (two) times daily. To elbows and hands, Disp: 60 g, Rfl: 2 .  cyclobenzaprine (FLEXERIL) 5 MG tablet, Take 1 tablet (5 mg total) by mouth at bedtime as needed for muscle spasms., Disp: 90 tablet, Rfl: 0 .  fluticasone (FLONASE) 50 MCG/ACT nasal spray, Place 2 sprays into both nostrils daily.,  Disp: 16 g, Rfl: 6 .  gabapentin (NEURONTIN) 300 MG capsule, Take 1 capsule (300 mg total) by mouth at bedtime., Disp: 30 capsule, Rfl: 11 .  mupirocin ointment (BACTROBAN) 2 %, Apply 1 application topically 2 (two) times daily. To both nostrils x 5 days, Disp: 30 g, Rfl: 0 .  phentermine (ADIPEX-P) 37.5 MG tablet, Take 1 tablet (37.5 mg total) by mouth daily before breakfast., Disp: 60 tablet, Rfl: 0 .  sodium chloride (OCEAN) 0.65 % SOLN nasal spray, Place 2 sprays into both nostrils as needed for congestion. Use 1st before flonase, Disp: 1 Bottle, Rfl: 12 .  triamcinolone cream (KENALOG) 0.1 %, Bid elbows avoid face/groin/private, Disp: 454 g, Rfl: 1 .  venlafaxine XR (EFFEXOR-XR) 150 MG 24 hr capsule, Take 1 capsule (150 mg total) by mouth daily with breakfast., Disp: 90 capsule, Rfl: 3 .  zolpidem (AMBIEN CR) 6.25 MG CR tablet, Take 1 tablet (6.25  mg total) by mouth at bedtime as needed for sleep., Disp: 31 tablet, Rfl: 5 .  diclofenac (CATAFLAM) 50 MG tablet, Take 1 tablet (50 mg total) by mouth 2 (two) times daily as needed., Disp: 180 tablet, Rfl: 1  EXAM:  VITALS per patient if applicable:  GENERAL: alert, oriented, appears well and in no acute distress  HEENT: atraumatic, conjunttiva clear, no obvious abnormalities on inspection of external nose and ears  NECK: normal movements of the head and neck  LUNGS: on inspection no signs of respiratory distress, breathing rate appears normal, no obvious gross SOB, gasping or wheezing  CV: no obvious cyanosis  MS: moves all visible extremities without noticeable abnormality  PSYCH/NEURO: pleasant and cooperative, no obvious depression or anxiety, speech and thought processing grossly intact  ASSESSMENT AND PLAN:  Discussed the following assessment and plan:  Lumbar radiculopathy - Plan: gabapentin (NEURONTIN) 300 MG capsule Pending MRI lumbar an dXray left hip pt wants to do this 01/2019   Insomnia, unspecified type - Plan: zolpidem (AMBIEN CR) 6.25 MG CR tablet refilled   Anxiety/depression - Plan: venlafaxine XR (EFFEXOR-XR) 150 MG 24 hr capsule, ALPRAZolam (XANAX) 0.5 MG tablet -pt missed appt with Dr. Maryruth Bun encouraged to resch  Menopause - Plan: venlafaxine XR (EFFEXOR-XR) 150 MG 24 hr capsule  Essential hypertension - Plan: amLODipine (NORVASC) 2.5 MG tablet refilled medication   Left hip pain Left foot pain -rec get Xray L hip and foot  -f/u Dr. Renard Matter c/w PSA arthritis if negative will need ortho   Hoarseness of voice-not seen ENT yet   Polyarthralgia with PSA - Plan: Ambulatory referral to Rheumatology pt wants to see 01/2019   Hm-comprehensive labs due 05/18/2019  Fluhad atwork10/1/19 utdTdap5/1/18 Had 3/3 hep B vaccines  -need to check titer in future   Pap 04/28/17 neg neg hpv +BV mammoneg4/9/19 negsch 10/16/2018 with implants  -order in pt  needs to schedule   [facility]  Referred to dermatology in pastDr. Roxan Diesel yet been  SeeingrheumatologyDr. Katherine Basset  05/21/18  Referred for f/u   ent referral in the past has not been yet   [facility] (see media) form lipid, A1C, TSH, UA, vit D, urine culture h/o hematuriasee result note 05/17/2018   -we discussed possible serious and likely etiologies, options for evaluation and workup, limitations of telemedicine visit vs in person visit, treatment, treatment risks and precautions. Pt prefers to treat via telemedicine empirically rather then risking or undertaking an in person visit at this moment. Patient agrees  to seek prompt in person care if worsening, new symptoms arise, or if is not improving with treatment.   I discussed the assessment and treatment plan with the patient. The patient was provided an opportunity to ask questions and all were answered. The patient agreed with the plan and demonstrated an understanding of the instructions.   The patient was advised to call back or seek an in-person evaluation if the symptoms worsen or if the condition fails to improve as anticipated.  Time spent 15 minutes  Delorise Jackson, MD

## 2018-12-18 ENCOUNTER — Other Ambulatory Visit: Payer: Self-pay | Admitting: Internal Medicine

## 2018-12-18 DIAGNOSIS — M542 Cervicalgia: Secondary | ICD-10-CM

## 2018-12-18 MED ORDER — GABAPENTIN 300 MG PO CAPS: 300.0000 mg | ORAL_CAPSULE | Freq: Every day | ORAL | 11 refills | Status: DC

## 2018-12-18 MED ORDER — AMLODIPINE BESYLATE 2.5 MG PO TABS: 2.5000 mg | ORAL_TABLET | Freq: Every day | ORAL | 3 refills | Status: DC

## 2018-12-18 MED ORDER — ALPRAZOLAM 0.5 MG PO TABS: 0.5000 mg | ORAL_TABLET | Freq: Two times a day (BID) | ORAL | 5 refills | Status: DC | PRN

## 2018-12-18 MED ORDER — VENLAFAXINE HCL ER 150 MG PO CP24: 150.0000 mg | ORAL_CAPSULE | Freq: Every day | ORAL | 3 refills | Status: DC

## 2018-12-18 MED ORDER — ZOLPIDEM TARTRATE ER 6.25 MG PO TBCR: 6.2500 mg | EXTENDED_RELEASE_TABLET | Freq: Every evening | ORAL | 5 refills | Status: DC | PRN

## 2018-12-18 MED ORDER — DICLOFENAC POTASSIUM 50 MG PO TABS: 50.0000 mg | ORAL_TABLET | Freq: Two times a day (BID) | ORAL | 1 refills | Status: DC | PRN

## 2018-12-18 NOTE — Telephone Encounter (Signed)
Pt called again to check on refill for ALPRAZolam (XANAX) 0.5 MG tablet/venlafaxine XR (EFFEXOR-XR) 150 MG 24 hr capsule. Pt is very emotional stated she requested refill on 12/15/18 and had virtual appt on 12/19/18. Please contact pt when refill has been sent.

## 2018-12-18 NOTE — Telephone Encounter (Signed)
Patient called back to check on status of medication refill request.  Message looks to have been routed to office but was not showing up in pool today.  Patient was emotional and said that she has been without her medications for 2 weeks because she can't find them.  Patient said that she really needs her meds and would like them sent in today.

## 2018-12-18 NOTE — Telephone Encounter (Signed)
Patient called in to check on status of medication refill for Alprazolam and venlafaxine. Patient became emotional because she stated she has not had her medication in 2 weeks. Please advise.

## 2018-12-22 ENCOUNTER — Encounter: Payer: Self-pay | Admitting: Internal Medicine

## 2018-12-25 ENCOUNTER — Emergency Department
Admission: EM | Admit: 2018-12-25 | Discharge: 2018-12-25 | Disposition: A | Discharge status: Home / Self Care | Payer: Self-pay | Attending: Emergency Medicine | Admitting: Emergency Medicine

## 2018-12-25 ENCOUNTER — Other Ambulatory Visit: Payer: Self-pay

## 2018-12-25 DIAGNOSIS — R1110 Vomiting, unspecified: Secondary | ICD-10-CM | POA: Insufficient documentation

## 2018-12-25 DIAGNOSIS — F17210 Nicotine dependence, cigarettes, uncomplicated: Secondary | ICD-10-CM | POA: Insufficient documentation

## 2018-12-25 DIAGNOSIS — Z79899 Other long term (current) drug therapy: Secondary | ICD-10-CM | POA: Insufficient documentation

## 2018-12-25 DIAGNOSIS — I10 Essential (primary) hypertension: Secondary | ICD-10-CM | POA: Insufficient documentation

## 2018-12-25 MED ORDER — PROMETHAZINE HCL 25 MG PO TABS: 25.0000 mg | ORAL_TABLET | Freq: Four times a day (QID) | ORAL | 0 refills | Status: DC | PRN

## 2018-12-25 NOTE — ED Provider Notes (Signed)
[facility] Emergency Department Provider Note   ____________________________________________   First MD Initiated Contact with Patient 12/25/18 1236     (approximate)  I have reviewed the triage vital signs and the nursing notes.   HISTORY  Chief Complaint Emesis    HPI Shelly Stevenson is a 53 y.o. female patient complains of nausea and vomiting for 2 days.  Patient stated complaint has resolved but she needs a work note for the past 2 days.  Patient also requesting a prescription for Phenergan.  Patient is Zofran does not work for her.  Patient that she contact her family doctor of both unable to see him today and needs to know before she can return back to work this evening.   Patient denies URI signs and symptoms.  She states there is no diarrhea.   Past Medical History:  Diagnosis Date  . Anemia    with PICA sx's   . Anxiety   . Bacterial vaginosis   . Chicken pox   . Depression   . HSV (herpes simplex virus) infection   . Hypertension   . Vitamin D deficiency     Patient Active Problem List   Diagnosis Date Noted  . Left hip pain 11/16/2018  . Chronic pain of left ankle 11/16/2018  . Left foot pain 11/16/2018  . Allergic rhinitis 09/10/2018  . Cervicalgia 09/09/2018  . Gallstones 09/08/2018  . MRSA exposure 07/14/2018  . Vitamin D deficiency 05/21/2018  . Psoriatic arthritis ([place]) 03/06/2018  . Overweight (BMI 25.0-29.9) 03/06/2018  . Hoarseness of voice 12/12/2017  . Low back pain 07/30/2017  . HLD (hyperlipidemia) 06/13/2017  . Bacterial vaginosis 06/13/2017  . Obesity (BMI 30-39.9) 06/13/2017  . Rash 06/13/2017  . Special screening for malignant neoplasms, colon   . Positive ANA (antinuclear antibody) 04/28/2017  . Anxiety 03/24/2017  . Depression, recurrent ([place]) 03/24/2017  . Menopause 03/24/2017  . Insomnia 03/24/2017  . Psoriasis 03/24/2017  . Arthralgia 03/24/2017  . Hot flashes 03/24/2017  . Anemia 03/24/2017  .  Bilateral carpal tunnel syndrome 03/24/2017    Past Surgical History:  Procedure Laterality Date  . APPENDECTOMY     53 y.o.   . AUGMENTATION MAMMAPLASTY Bilateral   . COLONOSCOPY WITH PROPOFOL N/A 05/02/2017   Procedure: COLONOSCOPY WITH PROPOFOL;  Surgeon: Virgel Manifold, MD;  Location: [facility] ENDOSCOPY;  Service: Endoscopy;  Laterality: N/A;  . FRACTURE SURGERY     Left thumb    Prior to Admission medications   Medication Sig Start Date End Date Taking? Authorizing Provider  ALPRAZolam Duanne Moron) 0.5 MG tablet Take 1 tablet (0.5 mg total) by mouth 2 (two) times daily as needed for anxiety. 12/18/18   McLean-Scocuzza, Nino Glow, MD  amLODipine (NORVASC) 2.5 MG tablet Take 1 tablet (2.5 mg total) by mouth daily. 12/18/18   McLean-Scocuzza, Nino Glow, MD  Cholecalciferol 1.25 MG (50000 UT) capsule Take 1 capsule (50,000 Units total) by mouth once a week. 05/17/18   McLean-Scocuzza, Nino Glow, MD  clobetasol cream (TEMOVATE) 4.43 % Apply 1 application topically 2 (two) times daily. To elbows and hands 05/21/18   McLean-Scocuzza, Nino Glow, MD  cyclobenzaprine (FLEXERIL) 5 MG tablet Take 1 tablet (5 mg total) by mouth at bedtime as needed for muscle spasms. 09/08/18   McLean-Scocuzza, Nino Glow, MD  diclofenac (CATAFLAM) 50 MG tablet Take 1 tablet (50 mg total) by mouth 2 (two) times daily as needed. 12/18/18   McLean-Scocuzza, Nino Glow, MD  fluticasone (FLONASE) 50 MCG/ACT  nasal spray Place 2 sprays into both nostrils daily. 09/10/18   McLean-Scocuzza, Pasty Spillersracy N, MD  gabapentin (NEURONTIN) 300 MG capsule Take 1 capsule (300 mg total) by mouth at bedtime. 12/18/18   McLean-Scocuzza, Pasty Spillersracy N, MD  mupirocin ointment (BACTROBAN) 2 % Apply 1 application topically 2 (two) times daily. To both nostrils x 5 days 07/14/18   McLean-Scocuzza, Pasty Spillersracy N, MD  phentermine (ADIPEX-P) 37.5 MG tablet Take 1 tablet (37.5 mg total) by mouth daily before breakfast. 03/06/18   McLean-Scocuzza, Pasty Spillersracy N, MD  promethazine (PHENERGAN) 25 MG  tablet Take 1 tablet (25 mg total) by mouth every 6 (six) hours as needed for nausea or vomiting. 12/25/18   Joni ReiningSmith, Thurma Priego K, PA-C  sodium chloride (OCEAN) 0.65 % SOLN nasal spray Place 2 sprays into both nostrils as needed for congestion. Use 1st before flonase 09/10/18   McLean-Scocuzza, Pasty Spillersracy N, MD  triamcinolone cream (KENALOG) 0.1 % Bid elbows avoid face/groin/private 03/24/17   McLean-Scocuzza, Pasty Spillersracy N, MD  venlafaxine XR (EFFEXOR-XR) 150 MG 24 hr capsule Take 1 capsule (150 mg total) by mouth daily with breakfast. 12/18/18   McLean-Scocuzza, Pasty Spillersracy N, MD  zolpidem (AMBIEN CR) 6.25 MG CR tablet Take 1 tablet (6.25 mg total) by mouth at bedtime as needed for sleep. 12/18/18   McLean-Scocuzza, Pasty Spillersracy N, MD    Allergies Penicillins and Codeine  Family History  Problem Relation Age of Onset  . Arthritis Mother   . Cancer Mother        breast  . Hearing loss Mother   . Hypertension Mother   . Breast cancer Mother [id number]  . Arthritis Father   . Cancer Father        colon dx'ed age 7370s   . Hypertension Father   . Heart disease Father        MI  . Diabetes Father   . Mental illness Son        [facility]    Social History Social History   Tobacco Use  . Smoking status: Current Every Day Smoker    Packs/day: 0.50    Types: Cigarettes  . Smokeless tobacco: Never Used  . Tobacco comment: <1ppd x 30 years no FH lung cancer   Substance Use Topics  . Alcohol use: Yes    Comment: socially per pt   . Drug use: No    Review of Systems Constitutional: No fever/chills Eyes: No visual changes. ENT: No sore throat. Cardiovascular: Denies chest pain. Respiratory: Denies shortness of breath. Gastrointestinal: No abdominal pain.  No nausea, no vomiting.  No diarrhea.  No constipation. Genitourinary: Negative for dysuria. Musculoskeletal: Negative for back pain. Skin: Negative for rash. Neurological: Negative for headaches, focal weakness or numbness. Psychiatric:  Anxiety and depression.  Endocrine:  Hyperlipidemia. Allergic/Immunilogical: Penicillin and codeine. ____________________________________________   PHYSICAL EXAM:  VITAL SIGNS: ED Triage Vitals  Enc Vitals Group     BP 12/25/18 1226 (!) 141/98     Pulse Rate 12/25/18 1226 99     Resp 12/25/18 1226 18     Temp 12/25/18 1226 98.1 F (36.7 C)     Temp Source 12/25/18 1226 Oral     SpO2 12/25/18 1226 100 %     Weight 12/25/18 1227 158 lb (71.7 kg)     Height 12/25/18 1227 5\' 5"  (1.651 m)     Head Circumference --      Peak Flow --      Pain Score 12/25/18 1227 0     Pain Loc --  Pain Edu? --      Excl. in GC? --    Constitutional: Alert and oriented. Well appearing and in no acute distress. Deferred cardiovascular: Normal rate, regular rhythm. Grossly normal heart sounds.  Good peripheral circulation. Respiratory: Normal respiratory effort.  No retractions. Lungs CTAB. Gastrointestinal: Soft and nontender. No distention. No abdominal bruits. No CVA tenderness. Genitourinary:  Skin:  Skin is warm, dry and intact. No rash noted. Psychiatric: Mood and affect are normal. Speech and behavior are normal.  ____________________________________________   LABS (all labs ordered are listed, but only abnormal results are displayed)  Labs Reviewed - No data to display ____________________________________________  EKG   ____________________________________________  RADIOLOGY  ED MD interpretation:    Official radiology report(s): No results found.  ____________________________________________   PROCEDURES  Procedure(s) performed (including Critical Care):  Procedures   ____________________________________________   INITIAL IMPRESSION / ASSESSMENT AND PLAN / ED COURSE  As part of my medical decision making, I reviewed the following data within the electronic MEDICAL RECORD NUMBER         Shelly Stevenson was evaluated in Emergency Department on 12/25/2018 for the symptoms described in the  history of present illness. She was evaluated in the context of the global COVID-19 pandemic, which necessitated consideration that the patient might be at risk for infection with the SARS-CoV-2 virus that causes COVID-19. Institutional protocols and algorithms that pertain to the evaluation of patients at risk for COVID-19 are in a state of rapid change based on information released by regulatory bodies including the CDC and federal and state organizations. These policies and algorithms were followed during the patient's care in the ED.  Patient requests work note secondary to resolve nausea and vomiting for past 2 to 3 days.  Physical exam is grossly unremarkable.  Advised patient cannot backdate work note.  Patient given discharge care instruction and a work note for today.  Patient advised follow-up PCP.      ____________________________________________   FINAL CLINICAL IMPRESSION(S) / ED DIAGNOSES  Final diagnoses:  Non-intractable vomiting, presence of nausea not specified, unspecified vomiting type     ED Discharge Orders         Ordered    promethazine (PHENERGAN) 25 MG tablet  Every 6 hours PRN     12/25/18 1248           Note:  This document was prepared using Dragon voice recognition software and may include unintentional dictation errors.    Joni Reining, PA-C 12/25/18 1253    Arnaldo Natal, MD 12/25/18 [phone]

## 2018-12-25 NOTE — ED Triage Notes (Signed)
Pt states that has been vomiting for the past 24 hours, denies diarrhea, states that she is over it now, but states that she needs a dr's note to return to work and wants a script for the nausea medicine that she used, no distress noted at this time, reports that her dr was unable to see her today due to not having any appointments available

## 2018-12-25 NOTE — Progress Notes (Signed)
Order needs to be changed to routine instead of stat since she doesn't want it till Nov.

## 2018-12-25 NOTE — ED Notes (Signed)
See triage note  States she developed a stomach virus couple of days ago   Denies any fever  States she developed the n/v/d  Last time with diarrhea and vomiting was last pm

## 2018-12-28 NOTE — Addendum Note (Signed)
Addended by: Orland Mustard on: 12/28/2018 09:35 PM   Modules accepted: Orders

## 2019-02-15 ENCOUNTER — Ambulatory Visit: Payer: Self-pay

## 2019-06-24 ENCOUNTER — Telehealth: Payer: Self-pay | Admitting: Internal Medicine

## 2019-06-24 ENCOUNTER — Ambulatory Visit: Payer: Self-pay | Admitting: Internal Medicine

## 2019-06-24 NOTE — Telephone Encounter (Signed)
Patient no showed for her 2:00 pm appointment with Dr French Ana on 06/24/19.   Please cancel/reschedule patient's appointment. Thank you!

## 2019-07-13 ENCOUNTER — Encounter: Payer: Self-pay | Admitting: Emergency Medicine

## 2019-07-13 ENCOUNTER — Other Ambulatory Visit: Payer: Self-pay

## 2019-07-13 ENCOUNTER — Emergency Department
Admission: EM | Admit: 2019-07-13 | Discharge: 2019-07-14 | Disposition: A | Discharge status: Home / Self Care | Payer: Self-pay | Attending: Emergency Medicine | Admitting: Emergency Medicine

## 2019-07-13 ENCOUNTER — Emergency Department: Payer: Self-pay

## 2019-07-13 DIAGNOSIS — I10 Essential (primary) hypertension: Secondary | ICD-10-CM | POA: Insufficient documentation

## 2019-07-13 DIAGNOSIS — G4700 Insomnia, unspecified: Secondary | ICD-10-CM | POA: Diagnosis present

## 2019-07-13 DIAGNOSIS — T50904A Poisoning by unspecified drugs, medicaments and biological substances, undetermined, initial encounter: Secondary | ICD-10-CM | POA: Insufficient documentation

## 2019-07-13 DIAGNOSIS — Z79899 Other long term (current) drug therapy: Secondary | ICD-10-CM | POA: Insufficient documentation

## 2019-07-13 DIAGNOSIS — F339 Major depressive disorder, recurrent, unspecified: Secondary | ICD-10-CM | POA: Diagnosis present

## 2019-07-13 DIAGNOSIS — T50901A Poisoning by unspecified drugs, medicaments and biological substances, accidental (unintentional), initial encounter: Secondary | ICD-10-CM | POA: Diagnosis present

## 2019-07-13 DIAGNOSIS — F17210 Nicotine dependence, cigarettes, uncomplicated: Secondary | ICD-10-CM | POA: Insufficient documentation

## 2019-07-13 DIAGNOSIS — F32A Depression, unspecified: Secondary | ICD-10-CM | POA: Diagnosis present

## 2019-07-13 DIAGNOSIS — F419 Anxiety disorder, unspecified: Secondary | ICD-10-CM | POA: Insufficient documentation

## 2019-07-13 DIAGNOSIS — F329 Major depressive disorder, single episode, unspecified: Secondary | ICD-10-CM | POA: Insufficient documentation

## 2019-07-13 LAB — URINALYSIS, COMPLETE (UACMP) WITH MICROSCOPIC
Bilirubin Urine: NEGATIVE
Glucose, UA: 50 mg/dL — AB
Ketones, ur: NEGATIVE mg/dL
Nitrite: NEGATIVE
Protein, ur: NEGATIVE mg/dL
Specific Gravity, Urine: 1.014 (ref 1.005–1.030)
pH: 6 (ref 5.0–8.0)

## 2019-07-13 LAB — COMPREHENSIVE METABOLIC PANEL
ALT: 21 U/L (ref 0–44)
AST: 25 U/L (ref 15–41)
Albumin: 4.4 g/dL (ref 3.5–5.0)
Alkaline Phosphatase: 96 U/L (ref 38–126)
Anion gap: 9 (ref 5–15)
BUN: 21 mg/dL — ABNORMAL HIGH (ref 6–20)
CO2: 26 mmol/L (ref 22–32)
Calcium: 9.4 mg/dL (ref 8.9–10.3)
Chloride: 105 mmol/L (ref 98–111)
Creatinine, Ser: 0.97 mg/dL (ref 0.44–1.00)
GFR calc Af Amer: >60 mL/min (ref >60)
GFR calc non Af Amer: >60 mL/min (ref >60)
Glucose, Bld: 180 mg/dL — ABNORMAL HIGH (ref 70–99)
Potassium: 3.4 mmol/L — ABNORMAL LOW (ref 3.5–5.1)
Sodium: 140 mmol/L (ref 135–145)
Total Bilirubin: 0.7 mg/dL (ref 0.3–1.2)
Total Protein: 7.8 g/dL (ref 6.5–8.1)

## 2019-07-13 LAB — CBC WITH DIFFERENTIAL/PLATELET
Abs Immature Granulocytes: 0.04 10*3/uL (ref 0.00–0.07)
Basophils Absolute: 0 10*3/uL (ref 0.0–0.1)
Basophils Relative: 1 %
Eosinophils Absolute: 0.1 10*3/uL (ref 0.0–0.5)
Eosinophils Relative: 2 %
HCT: 37.3 % (ref 36.0–46.0)
Hemoglobin: 12.7 g/dL (ref 12.0–15.0)
Immature Granulocytes: 1 %
Lymphocytes Relative: 18 %
Lymphs Abs: 1.1 10*3/uL (ref 0.7–4.0)
MCH: 31 pg (ref 26.0–34.0)
MCHC: 34 g/dL (ref 30.0–36.0)
MCV: 91 fL (ref 80.0–100.0)
Monocytes Absolute: 0.4 10*3/uL (ref 0.1–1.0)
Monocytes Relative: 7 %
Neutro Abs: 4.4 10*3/uL (ref 1.7–7.7)
Neutrophils Relative %: 71 %
Platelets: 258 10*3/uL (ref 150–400)
RBC: 4.1 MIL/uL (ref 3.87–5.11)
RDW: 12.4 % (ref 11.5–15.5)
WBC: 6.1 10*3/uL (ref 4.0–10.5)
nRBC: 0 % (ref 0.0–0.2)

## 2019-07-13 LAB — URINE DRUG SCREEN, QUALITATIVE (ARMC ONLY)
Amphetamines, Ur Screen: POSITIVE — AB
Barbiturates, Ur Screen: NOT DETECTED
Benzodiazepine, Ur Scrn: POSITIVE — AB
Cannabinoid 50 Ng, Ur ~~LOC~~: NOT DETECTED
Cocaine Metabolite,Ur ~~LOC~~: NOT DETECTED
MDMA (Ecstasy)Ur Screen: NOT DETECTED
Methadone Scn, Ur: NOT DETECTED
Opiate, Ur Screen: NOT DETECTED
Phencyclidine (PCP) Ur S: NOT DETECTED
Tricyclic, Ur Screen: NOT DETECTED

## 2019-07-13 LAB — TROPONIN I (HIGH SENSITIVITY)
Troponin I (High Sensitivity): 12 ng/L (ref <18)
Troponin I (High Sensitivity): 16 ng/L (ref <18)

## 2019-07-13 LAB — ETHANOL: Alcohol, Ethyl (B): <10 mg/dL (ref <10)

## 2019-07-13 LAB — ACETAMINOPHEN LEVEL: Acetaminophen (Tylenol), Serum: <10 ug/mL — ABNORMAL LOW (ref 10–30)

## 2019-07-13 LAB — SALICYLATE LEVEL: Salicylate Lvl: <7 mg/dL — ABNORMAL LOW (ref 7.0–30.0)

## 2019-07-13 LAB — GLUCOSE, CAPILLARY: Glucose-Capillary: 205 mg/dL — ABNORMAL HIGH (ref 70–99)

## 2019-07-13 MED ORDER — SODIUM CHLORIDE 0.9 % IV BOLUS
1000.0000 mL | Freq: Once | INTRAVENOUS | Status: AC
  Administered 2019-07-13: 1000 mL via INTRAVENOUS

## 2019-07-13 MED ORDER — FOSFOMYCIN TROMETHAMINE 3 G PO PACK
3.0000 g | PACK | Freq: Once | ORAL | Status: AC
  Administered 2019-07-13: 3 g via ORAL
  Filled 2019-07-13: qty 3

## 2019-07-13 NOTE — ED Notes (Signed)
IVC/Pending Re-Consult

## 2019-07-13 NOTE — ED Notes (Signed)
Patient's belongings packed in belonging bag, contents as followed-  1 orange tank top 1 pair black leggings 1 pair white slippers 6 rings of various colors and styles 1 watch  Yellow fall socks applied
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
## 2019-07-13 NOTE — ED Notes (Signed)
Annice Pih, NP called to evaluate patient

## 2019-07-13 NOTE — ED Notes (Signed)
Pt sister called with update per pt request. Pt denies any further needs at this time.

## 2019-07-13 NOTE — ED Notes (Signed)
Patient assisted to restroom by this RN. Patient knows she is in the hospital because her family sent her out of concern for overdose. Patient endorses that she "took something she shouldn't have" and that she "used meth". Patient denies use of any other substance. Patient denies SI/HI

## 2019-07-13 NOTE — ED Notes (Signed)
Pt given lunch tray and apple juice to drink
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
## 2019-07-13 NOTE — ED Notes (Signed)
IVC/PENDING PSYCH CONSULT 

## 2019-07-13 NOTE — ED Notes (Signed)

## 2019-07-13 NOTE — ED Notes (Signed)
[phone]- Shelly Stevenson

## 2019-07-13 NOTE — ED Notes (Signed)
Pt. To BHU from ED ambulatory without difficulty, to room  3. Report from [facility]. Pt. Is alert and oriented, warm and dry in no distress. Pt. Denies SI, HI, and AVH. Pt. Calm and cooperative. Pt. Made aware of security cameras and Q15 minute rounds. Pt. Encouraged to let Nursing staff know of any concerns or needs.

## 2019-07-14 DIAGNOSIS — T50901A Poisoning by unspecified drugs, medicaments and biological substances, accidental (unintentional), initial encounter: Secondary | ICD-10-CM | POA: Diagnosis present

## 2019-07-14 NOTE — ED Notes (Signed)
Hourly rounding reveals patient sleeping in room. No complaints, stable, in no acute distress. Q15 minute rounds and monitoring via Security Cameras to continue. 

## 2019-07-14 NOTE — ED Provider Notes (Signed)
Patient seen by the psychiatric team and IVC was rescinded and patient cleared for discharge home.  UA looked like it had some bacteria in it but patient is denying any symptoms of UTI.  Will hold off on treatment since no symptoms.      Concha Se, MD 07/14/19 1134

## 2019-07-14 NOTE — Consult Note (Signed)
[facility] Face-to-Face [facility]iat[facility] Consult   Reason for Consult: Dr. Dolores FrameSung Referring Physician:  Drug Overdose Patient Identification: Shelly Stevenson MRN:  [id number] Principal Diagnosis: <principal problem not specified> Diagnosis:  Active Problems:   Anxiety   Depression, recurrent (HCC)   Insomnia   Overdose substances   Total Time spent with patient: 45 minutes  Subjective: "My boyfriend gave me something to try, and this is what happens." Shelly Stevenson is a 54 y.o. female patient [facility] via EMS from home after her friend found her with an altered presentation at her house, and then she became unconscious.  The patient's boyfriend administers Narcan 4 mg which reverted her to a conscious state.   The patient was seen face-to-face by this provider; chart reviewed and consulted with Dr. Cathie OldenSiadick  07/13/2019 due to the patient's care. It was discussed with the EDP that the patient does not meet the criteria to be admitted to the psychiatric inpatient unit.  The patient is alert and oriented x 4, anxious but cooperative, and mood-congruent with affect on evaluation. The patient does not appear to be responding to internal or external stimuli. Neither is the patient presenting with any delusional thinking. The patient denies auditory or visual hallucinations. The patient denies any suicidal, homicidal, or self-harm ideations. The patient is not presenting with any psychotic or paranoid behaviors. During an encounter with the patient, she is purposely not truthful about her substance abuse history.  The patient, drug overdose was unintentional.  Discussion with the patient if she is interested in getting substance abuse treatment.  She continues to deny that she does use drugs. Plan: The patient is not a safety risk to herself or others and does not require psychiatric inpatient admission for stabilization and treatment.  HPI: Per Dr. Dolores FrameSung: Shelly Stevenson is a 54 y.o. female brought to the ED via EMS  from home with a chief complaint of suspected overdose.  Family found her unresponsive and through ice water on her.  She did arouse with Narcan.  Able to state her name but refuses to answer any other questions.  Rest of history is limited secondary to patient's somnolence and uncooperative nature.    Past Psychiatric History:  Anxiety Depression  Risk to Self: Suicidal Ideation: No Suicidal Intent: No Is patient at risk for suicide?: No Suicidal Plan?: No Access to Means: No What has been your use of drugs/alcohol within the last 12 months?: Denied use before today (Used marijuana 30 years ago) How many times?: 0 Other Self Harm Risks: denied Triggers for Past Attempts: None known Intentional Self Injurious Behavior: None Risk to Others: Homicidal Ideation: No Thoughts of Harm to Others: No Current Homicidal Intent: No Current Homicidal Plan: No Access to Homicidal Means: No Identified Victim: None identified History of harm to others?: No Assessment of Violence: None Noted Violent Behavior Description: Denied Does patient have access to weapons?: No Criminal Charges Pending?: Yes Describe Pending Criminal Charges: Simple possess, drug paraphernalia(Steal, take, and carry away furniture kitche) Does patient have a court date: Yes Court Date: 07/26/19(09/07/2019) Prior Inpatient Therapy: Prior Inpatient Therapy: No Prior Outpatient Therapy: Prior Outpatient Therapy: No Does patient have an ACCT team?: No Does patient have Intensive In-House Services?  : No Does patient have [facility] services? : No Does patient have P4CC services?: No  Past Medical History:  Past Medical History:  Diagnosis Date  . Anemia    with PICA sx's   . Anxiety   . Bacterial vaginosis   .  Chicken pox   . Depression   . HSV (herpes simplex virus) infection   . Hypertension   . Vitamin D deficiency     Past Surgical History:  Procedure Laterality Date  . APPENDECTOMY     54 y.o.   .  AUGMENTATION MAMMAPLASTY Bilateral   . COLONOSCOPY WITH PROPOFOL N/A 05/02/2017   Procedure: COLONOSCOPY WITH PROPOFOL;  Surgeon: Virgel Manifold, MD;  Location: [facility] ENDOSCOPY;  Service: Endoscopy;  Laterality: N/A;  . FRACTURE SURGERY     Left thumb   Family History:  Family History  Problem Relation Age of Onset  . Arthritis Mother   . Cancer Mother        breast  . Hearing loss Mother   . Hypertension Mother   . Breast cancer Mother 79  . Arthritis Father   . Cancer Father        colon dx'ed age 88s   . Hypertension Father   . Heart disease Father        MI  . Diabetes Father   . Mental illness Son        [facility]   Family Psychiatric  History:  Social History:  Social History   Substance and Sexual Activity  Alcohol Use Yes   Comment: socially per pt      Social History   Substance and Sexual Activity  Drug Use Yes    Social History   Socioeconomic History  . Marital status: Divorced    Spouse name: Not on file  . Number of children: Not on file  . Years of education: Not on file  . Highest education level: Not on file  Occupational History  . Not on file  Tobacco Use  . Smoking status: Current Every Day Smoker    Packs/day: 0.50    Types: Cigarettes  . Smokeless tobacco: Never Used  . Tobacco comment: <1ppd x 30 years no FH lung cancer   Substance and Sexual Activity  . Alcohol use: Yes    Comment: socially per pt   . Drug use: Yes  . Sexual activity: Yes    Comment: men  Other Topics Concern  . 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 Difficulty of  Paying Living Expenses:   Food Insecurity:   . Worried About Charity fundraiser in the Last Year:   . Arboriculturist in the Last Year:   Transportation Needs:   . Film/video editor (Medical):   Marland Kitchen Lack of Transportation (Non-Medical):   Physical Activity:   . Days of Exercise per Week:   . Minutes of Exercise per Session:   Stress:   . Feeling of Stress :   Social Connections:   . Frequency of Communication with Friends and Family:   . Frequency of Social Gatherings with Friends and Family:   . Attends Religious Services:   . Active Member of Clubs or Organizations:   . Attends Club  or Organization Meetings:   Marland Kitchen Marital Status:    Additional Social History:    Allergies:   Allergies  Allergen Reactions  . Penicillins Swelling    Angioedema  . Codeine Rash    Elevated heartrate    Labs:  Results for orders placed or performed during the hospital encounter of 07/13/19 (from the past 48 hour(s))  Glucose, capillary     Status: Abnormal   Collection Time: 07/13/19  1:57 AM  Result Value Ref Range   Glucose-Capillary 205 (H) 70 - 99 mg/dL    Comment: Glucose reference range applies only to samples taken after fasting for at least 8 hours.  CBC with Differential     Status: None   Collection Time: 07/13/19  2:09 AM  Result Value Ref Range   WBC 6.1 4.0 - 10.5 K/uL   RBC 4.10 3.87 - 5.11 MIL/uL   Hemoglobin 12.7 12.0 - 15.0 g/dL   HCT 21.3 08.6 - 57.8 %   MCV 91.0 80.0 - 100.0 fL   MCH 31.0 26.0 - 34.0 pg   MCHC 34.0 30.0 - 36.0 g/dL   RDW 46.9 62.9 - 52.8 %   Platelets 258 150 - 400 K/uL   nRBC 0.0 0.0 - 0.2 %   Neutrophils Relative % 71 %   Neutro Abs 4.4 1.7 - 7.7 K/uL   Lymphocytes Relative 18 %   Lymphs Abs 1.1 0.7 - 4.0 K/uL   Monocytes Relative 7 %   Monocytes Absolute 0.4 0.1 - 1.0 K/uL   Eosinophils Relative 2 %   Eosinophils Absolute 0.1 0.0 - 0.5 K/uL   Basophils Relative 1 %   Basophils Absolute 0.0 0.0 - 0.1 K/uL   Immature Granulocytes 1 %    Abs Immature Granulocytes 0.04 0.00 - 0.07 K/uL    Comment: Performed at [facility], [address] [place], [place] [address]  Comprehensive metabolic panel     Status: Abnormal   Collection Time: 07/13/19  2:09 AM  Result Value Ref Range   Sodium 140 135 - 145 mmol/L   Potassium 3.4 (L) 3.5 - 5.1 mmol/L   Chloride 105 98 - 111 mmol/L   CO2 26 22 - 32 mmol/L   Glucose, Bld 180 (H) 70 - 99 mg/dL    Comment: Glucose reference range applies only to samples taken after fasting for at least 8 hours.   BUN 21 (H) 6 - 20 mg/dL   Creatinine, Ser 4.01 0.44 - 1.00 mg/dL   Calcium 9.4 8.9 - 02.7 mg/dL   Total Protein 7.8 6.5 - 8.1 g/dL   Albumin 4.4 3.5 - 5.0 g/dL   AST 25 15 - 41 U/L   ALT 21 0 - 44 U/L   Alkaline Phosphatase 96 38 - 126 U/L   Total Bilirubin 0.7 0.3 - 1.2 mg/dL   GFR calc non Af Amer >60 >60 mL/min   GFR calc Af Amer >60 >60 mL/min   Anion gap 9 5 - 15    Comment: Performed at [facility], [address] [place], [place] [address]  Ethanol     Status: None   Collection Time: 07/13/19  2:09 AM  Result Value Ref Range   Alcohol, Ethyl (B) <10 <10 mg/dL    Comment: (NOTE) Lowest detectable limit for serum alcohol is 10 mg/dL. For medical purposes only. Performed at [facility], [address], [place], [place] [address]   Acetaminophen level     Status: Abnormal  Collection Time: 07/13/19  2:09 AM  Result Value Ref Range   Acetaminophen (Tylenol), Serum <10 (L) 10 - 30 ug/mL    Comment: (NOTE) Therapeutic concentrations vary significantly. A range of 10-30 ug/mL  may be an effective concentration for many patients. However, some  are best treated at concentrations outside of this range. Acetaminophen concentrations >150 ug/mL at 4 hours after ingestion  and >50 ug/mL at 12 hours after ingestion are often associated with  toxic reactions. Performed at [facility], [address] [place], [place] [address]    Salicylate level     Status: Abnormal   Collection Time: 07/13/19  2:09 AM  Result Value Ref Range   Salicylate Lvl <7.0 (L) 7.0 - 30.0 mg/dL    Comment: Performed at [facility], [address] [place], [place] [address]  Troponin I (High Sensitivity)     Status: None   Collection Time: 07/13/19  2:09 AM  Result Value Ref Range   Troponin I (High Sensitivity) 12 <18 ng/L    Comment: (NOTE) Elevated high sensitivity troponin I (hsTnI) values and significant  changes across serial measurements may suggest ACS but many other  chronic and acute conditions are known to elevate hsTnI results.  Refer to the "Links" section for chest pain algorithms and additional  guidance. Performed at [facility], [address] [place], [place] [address]   Urinalysis, Complete w Microscopic     Status: Abnormal   Collection Time: 07/13/19  2:09 AM  Result Value Ref Range   Color, Urine YELLOW (A) YELLOW   APPearance HAZY (A) CLEAR   Specific Gravity, Urine 1.014 1.005 - 1.030   pH 6.0 5.0 - 8.0   Glucose, UA 50 (A) NEGATIVE mg/dL   Hgb urine dipstick SMALL (A) NEGATIVE   Bilirubin Urine NEGATIVE NEGATIVE   Ketones, ur NEGATIVE NEGATIVE mg/dL   Protein, ur NEGATIVE NEGATIVE mg/dL   Nitrite NEGATIVE NEGATIVE   Leukocytes,Ua TRACE (A) NEGATIVE   RBC / HPF 0-5 0 - 5 RBC/hpf   WBC, UA 6-10 0 - 5 WBC/hpf   Bacteria, UA MANY (A) NONE SEEN   Squamous Epithelial / LPF 0-5 0 - 5   Mucus PRESENT     Comment: Performed at [facility], [address] [place], [place] [address]  Urine Drug Screen, Qualitative     Status: Abnormal   Collection Time: 07/13/19  2:09 AM  Result Value Ref Range   Tricyclic, Ur Screen NONE DETECTED NONE DETECTED   Amphetamines, Ur Screen POSITIVE (A) NONE DETECTED   MDMA (Ecstasy)Ur Screen NONE DETECTED NONE DETECTED   Cocaine Metabolite,Ur [place] NONE DETECTED NONE DETECTED   Opiate, Ur Screen NONE DETECTED NONE DETECTED   Phencyclidine (PCP)  Ur S NONE DETECTED NONE DETECTED   Cannabinoid 50 Ng, Ur [place] NONE DETECTED NONE DETECTED   Barbiturates, Ur Screen NONE DETECTED NONE DETECTED   Benzodiazepine, Ur Scrn POSITIVE (A) NONE DETECTED   Methadone Scn, Ur NONE DETECTED NONE DETECTED    Comment: (NOTE) Tricyclics + metabolites, urine    Cutoff 1000 ng/mL Amphetamines + metabolites, urine  Cutoff 1000 ng/mL MDMA (Ecstasy), urine              Cutoff 500 ng/mL Cocaine Metabolite, urine          Cutoff 300 ng/mL Opiate + metabolites, urine        Cutoff 300 ng/mL Phencyclidine (PCP), urine         Cutoff 25  ng/mL Cannabinoid, urine                 Cutoff 50 ng/mL Barbiturates + metabolites, urine  Cutoff 200 ng/mL Benzodiazepine, urine              Cutoff 200 ng/mL Methadone, urine                   Cutoff 300 ng/mL The urine drug screen provides only a preliminary, unconfirmed analytical test result and should not be used for non-medical purposes. Clinical consideration and professional judgment should be applied to any positive drug screen result due to possible interfering substances. A more specific alternate chemical method must be used in order to obtain a confirmed analytical result. Gas chromatography / mass spectrometry (GC/MS) is the preferred confirmat ory method. Performed at [facility], [address] [place], [place] [address]   Troponin I (High Sensitivity)     Status: None   Collection Time: 07/13/19  3:36 AM  Result Value Ref Range   Troponin I (High Sensitivity) 16 <18 ng/L    Comment: (NOTE) Elevated high sensitivity troponin I (hsTnI) values and significant  changes across serial measurements may suggest ACS but many other  chronic and acute conditions are known to elevate hsTnI results.  Refer to the "Links" section for chest pain algorithms and additional  guidance. Performed at [facility], [address] [place], [place] [address]     No current facility-administered  medications for this encounter.   Current Outpatient Medications  Medication Sig Dispense Refill  . ALPRAZolam (XANAX) 0.5 MG tablet Take 1 tablet (0.5 mg total) by mouth 2 (two) times daily as needed for anxiety. 30 tablet 5  . amLODipine (NORVASC) 2.5 MG tablet Take 1 tablet (2.5 mg total) by mouth daily. 90 tablet 3  . Cholecalciferol 1.25 MG (50000 UT) capsule Take 1 capsule (50,000 Units total) by mouth once a week. 13 capsule 1  . clobetasol cream (TEMOVATE) 0.05 % Apply 1 application topically 2 (two) times daily. To elbows and hands 60 g 2  . cyclobenzaprine (FLEXERIL) 5 MG tablet Take 1 tablet (5 mg total) by mouth at bedtime as needed for muscle spasms. 90 tablet 0  . diclofenac (CATAFLAM) 50 MG tablet Take 1 tablet (50 mg total) by mouth 2 (two) times daily as needed. 180 tablet 1  . fluticasone (FLONASE) 50 MCG/ACT nasal spray Place 2 sprays into both nostrils daily. 16 g 6  . gabapentin (NEURONTIN) 300 MG capsule Take 1 capsule (300 mg total) by mouth at bedtime. 30 capsule 11  . mupirocin ointment (BACTROBAN) 2 % Apply 1 application topically 2 (two) times daily. To both nostrils x 5 days 30 g 0  . phentermine (ADIPEX-P) 37.5 MG tablet Take 1 tablet (37.5 mg total) by mouth daily before breakfast. 60 tablet 0  . promethazine (PHENERGAN) 25 MG tablet Take 1 tablet (25 mg total) by mouth every 6 (six) hours as needed for nausea or vomiting. 12 tablet 0  . sodium chloride (OCEAN) 0.65 % SOLN nasal spray Place 2 sprays into both nostrils as needed for congestion. Use 1st before flonase 1 Bottle 12  . triamcinolone cream (KENALOG) 0.1 % Bid elbows avoid face/groin/private 454 g 1  . venlafaxine XR (EFFEXOR-XR) 150 MG 24 hr capsule Take 1 capsule (150 mg total) by mouth daily with breakfast. 90 capsule 3  . zolpidem (AMBIEN CR) 6.25 MG CR tablet Take 1 tablet (6.25 mg total) by mouth  at bedtime as needed for sleep. 31 tablet 5    Musculoskeletal: Strength & Muscle Tone: decreased Gait  & Station: unsteady Patient leans: N/A  Psychiatric Specialty Exam: Physical Exam  Nursing note and vitals reviewed. Constitutional: She is oriented to person, place, and time. She appears well-developed and well-nourished.  Eyes: Pupils are equal, round, and reactive to light.  Cardiovascular: Normal rate.  Respiratory: Effort normal.  Musculoskeletal:        General: Normal range of motion.     Cervical back: Normal range of motion and neck supple.  Neurological: She is alert and oriented to person, place, and time.    Review of Systems  Blood pressure 115/78, pulse 78, temperature (!) 96.2 F (35.7 C), temperature source Axillary, resp. rate 16, height 5\' 5"  (1.651 m), weight 69.4 kg, SpO2 93 %.Body mass index is 25.46 kg/m.  General Appearance: Casual and Guarded  Eye Contact:  Fair  Speech:  Clear and Coherent  Volume:  Decreased  Mood:  Anxious, Depressed and Irritable  Affect:  Blunt, Congruent, Constricted, Depressed, Full Range and Tearful  Thought Process:  Coherent  Orientation:  Full (Time, Place, and Person)  Thought Content:  WDL and Logical  Suicidal Thoughts:  No  Homicidal Thoughts:  No  Memory:  Immediate;   The patient is not being truthful with no answers Recent;   The patient is not being truthful with answers Remote;   Poor  Judgement:  Poor  Insight:  Lacking  Psychomotor Activity:  Normal  Concentration:  Concentration: Fair and Attention Span: Fair  Recall:  Poor  Fund of Knowledge:  Poor  Language:  Fair  Akathisia:  Negative  Handed:  Right  AIMS (if indicated):     Assets:  Communication Skills Desire for Improvement Resilience Social Support  ADL's:  Intact  Cognition:  WNL  Sleep:        Treatment Plan Summary: Medication management and Plan Patient does not meet criteria for psychiatric inpatient admission patient can be discharged once medically clear collateral has been obtained.  Disposition: No evidence of imminent risk to  self or others at present.   Patient does not meet criteria for psychiatric inpatient admission. Supportive therapy provided about ongoing stressors. Refer to IOP. Discussed crisis plan, support from social network, calling 911, coming to the Emergency Department, and calling Suicide Hotline.  , NP 07/14/2019 1:39 AM

## 2019-07-14 NOTE — ED Notes (Signed)
Patient on the phone, getting a ride for discharge

## 2019-07-14 NOTE — Discharge Instructions (Signed)
You are cleared by the psychiatric team for discharge home.  Return to the ER for any other concerns 

## 2019-07-14 NOTE — Consult Note (Signed)
Met with patient prior to discharge. No lethality. No suicidally or intent to harm. No reason to continue the IVC thus rescinded. Approve discharge psychiatrically.

## 2019-07-14 NOTE — BH Assessment (Signed)
TTS & Psych reassessment completed. Pt presented oriented x 4. Pt admitted to using cocaine and denies suicidal attempt stating " I have never done cocaine before the overdose was not intentional". Pt denied any current SI/HI or hallucinations. Pt confirmed her safety.   Per Cindy Hazy, MD Pt will be discharged with the recommendation to follow up with substance resources provided.

## 2019-07-14 NOTE — ED Provider Notes (Signed)
Emergency Medicine Observation Re-evaluation Note  Shelly Stevenson is a 54 y.o. female, seen on rounds today.  Pt initially presented to the ED for complaints of Drug Overdose Currently, the patient is in no acute distress  Physical Exam  BP 115/78   Pulse 78   Temp (!) 96.2 F (35.7 C) (Axillary)   Resp 16   Ht 5\' 5"  (1.651 m)   Wt 69.4 kg   SpO2 93%   BMI 25.46 kg/m  Physical Exam  ED Course / MDM  EKG:EKG Interpretation  Date/Time:  Tuesday July 13 2019 02:00:37 EDT Ventricular Rate:  87 PR Interval:    QRS Duration: 88 QT Interval:  390 QTC Calculation: 470 R Axis:   18 Text Interpretation: Sinus rhythm RSR' in V1 or V2, probably normal variant Minimal ST depression, inferior leads Confirmed by UNCONFIRMED, DOCTOR ([id number]), editor [id number], Tammy [phone]) on 07/13/2019 8:45:20 AM  Clinical Course as of Jul 14 727  Tue Jul 13, 2019  0341 Patient ambulated with unsteady gait to the door.  Ordered Jul 15, 2019 with patient.   [JS]  Recruitment consultant Patient has been observed in the emergency department for 4 hours.  At this time she is medically cleared for psychiatric disposition. The patient has been placed in psychiatric observation due to the need to provide a safe environment for the patient while obtaining psychiatric consultation and evaluation, as well as ongoing medical and medication management to treat the patient's condition. The patient has been placed under full IVC at this time.     [JS]  [id number] No further events overnight. Patient did wake up at one point yesterday she "took something I should not have", referring to methamphetamine. She went quickly back to sleep. At this point patient is medically cleared and remains in the ED under IVC pending psychiatric evaluation and disposition.   [JS]    Clinical Course User Index [JS] [id number], MD   I have reviewed the labs performed to date as well as medications administered while in observation.   Plan  Current plan is for  psych eval. Patient is under full IVC at this time.   Irean Hong, MD 07/14/19 0730

## 2019-07-14 NOTE — ED Notes (Signed)
Patient discharged home, patient received discharge papers. Patient got belongings and verbalized he has received all of his belongings. Patient appropriate and cooperative, Denies SI/HI AVH. Vital signs taken. NAD noted. 

## 2019-09-09 ENCOUNTER — Telehealth: Payer: Self-pay | Admitting: Internal Medicine

## 2019-09-09 NOTE — Telephone Encounter (Signed)
[facility] called about pt medication order they faxed over on 6-9 pharmacy said they will fax the order again.

## 2019-09-10 NOTE — Telephone Encounter (Signed)
No answer, no voicemail.

## 2019-09-15 NOTE — Telephone Encounter (Signed)
No answer, no voicemail.  Also received disability paperwork. According to Dr French Ana the Patient needs to see Psychiatry to have this filled out. Does she want a referral?   Will send a mychart message.

## 2019-09-17 NOTE — Telephone Encounter (Signed)
No answer, no voicemail.  ° °Letter mailed to call into the office.  °

## 2019-09-20 ENCOUNTER — Telehealth: Payer: Self-pay | Admitting: Internal Medicine

## 2019-09-20 ENCOUNTER — Other Ambulatory Visit: Payer: Self-pay | Admitting: Internal Medicine

## 2019-09-20 DIAGNOSIS — L409 Psoriasis, unspecified: Secondary | ICD-10-CM

## 2019-09-20 DIAGNOSIS — I10 Essential (primary) hypertension: Secondary | ICD-10-CM

## 2019-09-20 DIAGNOSIS — J309 Allergic rhinitis, unspecified: Secondary | ICD-10-CM

## 2019-09-20 MED ORDER — SALINE SPRAY 0.65 % NA SOLN: 2.0000 | NASAL | 11 refills | Status: DC | PRN

## 2019-09-20 MED ORDER — CLOBETASOL PROPIONATE 0.05 % EX CREA: 1.0000 "application " | TOPICAL_CREAM | Freq: Two times a day (BID) | CUTANEOUS | 5 refills | Status: DC

## 2019-09-20 MED ORDER — FLUTICASONE PROPIONATE 50 MCG/ACT NA SUSP: 2.0000 | Freq: Every day | NASAL | 5 refills | Status: DC | PRN

## 2019-09-20 NOTE — Telephone Encounter (Signed)
[facility] called about pt medication order they faxed over on 6-9 pharmacy said they will fax the order again  I need to know is this pts pharmacy now?  [facility]  [address]  [place] [address]   Phone [phone]  fax [phone]   1) can you call this pharmacy and see if this is a true pharmacy?  2) can you call patient and f/u on prior phone called we tried to reach out to her for?  3) if medications refills needed   [facility] for refills  norvasc 2.5 mg daily #90 RF x 1  Temovate 0.05 cream for hands elbows bid prn 60 gram with RF x 5 flonase 2 sprays daily prn 16 gram RF x 11 Nasal saline 2 sprays daily prn before flonase   Please do not refill xanax 0.5, ambien CR 6.25 due to recent psych admission for drug overdose 07/13/19 she took methamphetamine   Thank you  TMS

## 2019-09-20 NOTE — Telephone Encounter (Signed)
No answer, no voicemail.

## 2019-09-23 NOTE — Telephone Encounter (Signed)
No answer, no voicemail.  Left message to return call on patient's emergency contact's number.   Letter and mychart message previously sent for the Patient to contact the office. She has not read [employer].

## 2019-09-24 ENCOUNTER — Telehealth: Payer: Self-pay | Admitting: Internal Medicine

## 2019-09-24 NOTE — Telephone Encounter (Signed)
Pt called about some paperwork she received in mail she does not have a phone right now will call back later

## 2019-09-27 NOTE — Telephone Encounter (Signed)
Awaiting return call

## 2019-11-18 ENCOUNTER — Telehealth: Payer: Self-pay

## 2019-11-18 ENCOUNTER — Telehealth: Payer: Self-pay | Admitting: Internal Medicine

## 2019-11-18 DIAGNOSIS — Z0289 Encounter for other administrative examinations: Secondary | ICD-10-CM

## 2019-11-18 NOTE — Telephone Encounter (Signed)
Left a voicemail to call back in order to complete Video visit with Dr French Ana Mclean-Scocuzza on 11/18/19 at 4pm. Attempted to reach patient 2 times for their appointment and have then join the video.

## 2019-11-19 ENCOUNTER — Telehealth: Payer: Self-pay | Admitting: Internal Medicine

## 2019-11-19 NOTE — Telephone Encounter (Signed)
Faxed physician order for back brace and Rx form for pliaglis 7%tetracaine7% cream,kdiclofenac 3% topical gel,diflorasone diacetate ointment 0.05%,Fenoprofen 200 mg capsule,cyclobenzaprine7.5 mg tablet. Faxed on 11-19-19

## 2019-11-29 ENCOUNTER — Encounter: Payer: Self-pay | Admitting: Nurse Practitioner

## 2019-11-29 ENCOUNTER — Other Ambulatory Visit: Payer: Self-pay

## 2019-11-29 NOTE — Progress Notes (Deleted)
Virtual Visit via Virtual  Note  This visit type was conducted due to national recommendations for restrictions regarding the COVID-19 pandemic (e.g. social distancing).  This format is felt to be most appropriate for this patient at this time.  All issues noted in this document were discussed and addressed.  No physical exam was performed (except for noted visual exam findings with Video Visits).   I connected with@ on 11/29/19 at  4:00 PM EDT by a video enabled telemedicine application or telephone and verified that I am speaking with the correct person using two identifiers. Location patient: home Location provider: work or home office Persons participating in the virtual visit: patient, provider  I discussed the limitations, risks, security and privacy concerns of performing an evaluation and management service by telephone and the availability of in person appointments. I also discussed with the patient that there may be a patient responsible charge related to this service. The patient expressed understanding and agreed to proceed.  Interactive audio and video telecommunications were attempted between this provider and patient, however failed, due to patient having technical difficulties OR patient did not have access to video capability.  We continued and completed visit with audio only. ***  Reason for visit: ***  HPI: ***   ROS: See pertinent positives and negatives per HPI.  Past Medical History:  Diagnosis Date  . Anemia    with PICA sx's   . Anxiety   . Bacterial vaginosis   . Chicken pox   . Depression   . HSV (herpes simplex virus) infection   . Hypertension   . Vitamin D deficiency     Past Surgical History:  Procedure Laterality Date  . APPENDECTOMY     54 y.o.   . AUGMENTATION MAMMAPLASTY Bilateral   . COLONOSCOPY WITH PROPOFOL N/A 05/02/2017   Procedure: COLONOSCOPY WITH PROPOFOL;  Surgeon: Pasty Spillers, MD;  Location: [facility] ENDOSCOPY;  Service:  Endoscopy;  Laterality: N/A;  . FRACTURE SURGERY     Left thumb    Family History  Problem Relation Age of Onset  . Arthritis Mother   . Cancer Mother        breast  . Hearing loss Mother   . Hypertension Mother   . Breast cancer Mother 47  . Arthritis Father   . Cancer Father        colon dx'ed age 8s   . Hypertension Father   . Heart disease Father        MI  . Diabetes Father   . Mental illness Son        [facility]    SOCIAL HX: ***   Current Outpatient Medications:  .  amLODipine (NORVASC) 2.5 MG tablet, Take 2.5 mg by mouth daily., Disp: , Rfl:  .  clobetasol cream (TEMOVATE) 0.05 %, Apply 1 application topically 2 (two) times daily. Hands elbows not face/groin, Disp: 60 g, Rfl: 5 .  fluticasone (FLONASE) 50 MCG/ACT nasal spray, Place 2 sprays into both nostrils daily as needed for allergies or rhinitis., Disp: 16 g, Rfl: 5 .  sodium chloride (OCEAN) 0.65 % SOLN nasal spray, Place 2 sprays into both nostrils as needed for congestion. Before flonase, Disp: 30 mL, Rfl: 11  EXAM:  VITALS per patient if applicable:  GENERAL: alert, oriented, appears well and in no acute distress  HEENT: atraumatic, conjunttiva clear, no obvious abnormalities on inspection of external nose and ears  NECK: normal movements of the head and neck  LUNGS: on inspection  no signs of respiratory distress, breathing rate appears normal, no obvious gross SOB, gasping or wheezing  CV: no obvious cyanosis  MS: moves all visible extremities without noticeable abnormality  PSYCH/NEURO: pleasant and cooperative, no obvious depression or anxiety, speech and thought processing grossly intact  ASSESSMENT AND PLAN:  Discussed the following assessment and plan:  No diagnosis found.  No problem-specific Assessment & Plan notes found for this encounter.    I discussed the assessment and treatment plan with the patient. The patient was provided an opportunity to ask questions and all were answered.  The patient agreed with the plan and demonstrated an understanding of the instructions.   The patient was advised to call back or seek an in-person evaluation if the symptoms worsen or if the condition fails to improve as anticipated.  I provided *** minutes of non-face-to-face time during this encounter.

## 2019-12-05 NOTE — Progress Notes (Signed)
Erroneous- NO SHOW

## 2020-01-06 ENCOUNTER — Other Ambulatory Visit: Payer: Self-pay

## 2020-01-06 MED ORDER — AMLODIPINE BESYLATE 2.5 MG PO TABS: 2.5000 mg | ORAL_TABLET | Freq: Every day | ORAL | 1 refills | Status: DC

## 2020-01-14 ENCOUNTER — Encounter: Payer: Self-pay | Admitting: Internal Medicine

## 2020-01-27 ENCOUNTER — Telehealth: Payer: Self-pay | Admitting: Internal Medicine

## 2020-01-27 NOTE — Telephone Encounter (Signed)
Faxed to [facility] for Venlafaxine ER150 MG cap faxed on 01-27-20

## 2020-02-07 ENCOUNTER — Telehealth: Payer: Self-pay | Admitting: Internal Medicine

## 2020-02-07 NOTE — Telephone Encounter (Signed)
Tried to reach patient to advise received recording cannot reach patient.
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
## 2020-02-07 NOTE — Telephone Encounter (Signed)
She can contact [facility] again for refill or UC  I am unable to refill this due to needs appt  If wants appt happy to help see her to refill and rec she get someone to bring her in the clinic I.e have a driver
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
## 2020-02-08 NOTE — Telephone Encounter (Signed)
Tried to reach patient by phone to advise no answer or voicemail.

## 2020-02-09 ENCOUNTER — Telehealth: Payer: Self-pay | Admitting: Internal Medicine

## 2020-02-09 NOTE — Telephone Encounter (Signed)
Wayne from [place] called checking on written order for a back brace [phone] was faxed today

## 2020-02-09 NOTE — Telephone Encounter (Signed)
Wayne from [place] called checking on written order for a back brace [phone]

## 2020-02-16 ENCOUNTER — Telehealth: Payer: Self-pay | Admitting: Internal Medicine

## 2020-02-16 NOTE — Telephone Encounter (Signed)
[facility] called stated that they faxed order for patient on 01-18-2020 have not received order yet they are faxing order again today

## 2020-02-23 NOTE — Telephone Encounter (Signed)
Faxed physicians's written order to medical supplies for denied  For back brace faxed on 02-22-2020

## 2020-03-01 ENCOUNTER — Telehealth (INDEPENDENT_AMBULATORY_CARE_PROVIDER_SITE_OTHER): Payer: Self-pay | Admitting: Internal Medicine

## 2020-03-01 ENCOUNTER — Other Ambulatory Visit: Payer: Self-pay

## 2020-03-01 ENCOUNTER — Encounter: Payer: Self-pay | Admitting: Internal Medicine

## 2020-03-01 VITALS — Ht 65.0 in | Wt 139.6 lb

## 2020-03-01 DIAGNOSIS — F32A Depression, unspecified: Secondary | ICD-10-CM | POA: Insufficient documentation

## 2020-03-01 DIAGNOSIS — F4323 Adjustment disorder with mixed anxiety and depressed mood: Secondary | ICD-10-CM | POA: Insufficient documentation

## 2020-03-01 DIAGNOSIS — Z1231 Encounter for screening mammogram for malignant neoplasm of breast: Secondary | ICD-10-CM

## 2020-03-01 DIAGNOSIS — D649 Anemia, unspecified: Secondary | ICD-10-CM | POA: Insufficient documentation

## 2020-03-01 DIAGNOSIS — F419 Anxiety disorder, unspecified: Secondary | ICD-10-CM

## 2020-03-01 DIAGNOSIS — R825 Elevated urine levels of drugs, medicaments and biological substances: Secondary | ICD-10-CM | POA: Insufficient documentation

## 2020-03-01 DIAGNOSIS — R634 Abnormal weight loss: Secondary | ICD-10-CM | POA: Insufficient documentation

## 2020-03-01 DIAGNOSIS — F339 Major depressive disorder, recurrent, unspecified: Secondary | ICD-10-CM | POA: Insufficient documentation
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
## 2020-03-02 ENCOUNTER — Telehealth: Payer: Self-pay | Admitting: *Deleted

## 2020-03-02 ENCOUNTER — Other Ambulatory Visit: Payer: Self-pay

## 2020-03-02 NOTE — Telephone Encounter (Signed)
Please place future orders for lab appt.  

## 2020-03-03 ENCOUNTER — Other Ambulatory Visit: Payer: Self-pay

## 2020-03-03 ENCOUNTER — Ambulatory Visit: Payer: Self-pay

## 2020-03-03 ENCOUNTER — Other Ambulatory Visit: Payer: Self-pay | Admitting: Internal Medicine

## 2020-03-03 DIAGNOSIS — I10 Essential (primary) hypertension: Secondary | ICD-10-CM

## 2020-03-03 DIAGNOSIS — E559 Vitamin D deficiency, unspecified: Secondary | ICD-10-CM

## 2020-03-03 DIAGNOSIS — Z13818 Encounter for screening for other digestive system disorders: Secondary | ICD-10-CM

## 2020-03-03 DIAGNOSIS — Z113 Encounter for screening for infections with a predominantly sexual mode of transmission: Secondary | ICD-10-CM

## 2020-03-03 DIAGNOSIS — R319 Hematuria, unspecified: Secondary | ICD-10-CM

## 2020-03-03 DIAGNOSIS — Z13220 Encounter for screening for lipoid disorders: Secondary | ICD-10-CM

## 2020-03-03 DIAGNOSIS — R825 Elevated urine levels of drugs, medicaments and biological substances: Secondary | ICD-10-CM

## 2020-03-03 DIAGNOSIS — Z1329 Encounter for screening for other suspected endocrine disorder: Secondary | ICD-10-CM

## 2020-03-03 DIAGNOSIS — Z0184 Encounter for antibody response examination: Secondary | ICD-10-CM

## 2020-03-03 DIAGNOSIS — D509 Iron deficiency anemia, unspecified: Secondary | ICD-10-CM

## 2020-03-03 NOTE — Progress Notes (Signed)
Pt will need BP check 03/03/20 with labs fasting   Dr. Lonie Peak

## 2020-03-03 NOTE — Telephone Encounter (Signed)
Pt needs to resch lab visit and BP check

## 2020-03-06 NOTE — Telephone Encounter (Signed)
Mychart message sent for Patient to call back to schedule.

## 2020-04-13 ENCOUNTER — Telehealth: Payer: Self-pay | Admitting: Internal Medicine

## 2020-04-13 ENCOUNTER — Other Ambulatory Visit: Payer: Self-pay

## 2020-04-13 ENCOUNTER — Encounter: Payer: Self-pay | Admitting: Internal Medicine

## 2020-04-13 VITALS — Ht 65.0 in | Wt 146.0 lb

## 2020-04-13 DIAGNOSIS — R825 Elevated urine levels of drugs, medicaments and biological substances: Secondary | ICD-10-CM

## 2020-04-13 DIAGNOSIS — F339 Major depressive disorder, recurrent, unspecified: Secondary | ICD-10-CM

## 2020-04-13 DIAGNOSIS — R600 Localized edema: Secondary | ICD-10-CM

## 2020-04-13 DIAGNOSIS — F419 Anxiety disorder, unspecified: Secondary | ICD-10-CM

## 2020-04-13 NOTE — Progress Notes (Signed)
Patient states her legs and ankles are very swollen. States she can not bear weight on therm for long.  Ongoing since the end of October. Legs are also itching.
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
## 2020-04-18 ENCOUNTER — Telehealth: Payer: Self-pay | Admitting: Internal Medicine

## 2020-04-18 NOTE — Telephone Encounter (Signed)
Patient called in stated that she did not know what to tell the people at the ED about why she needs to be seen at the ED

## 2020-04-18 NOTE — Addendum Note (Signed)
Addended by: Quentin Ore on: 04/18/2020 07:57 AM   Modules accepted: Orders

## 2020-04-18 NOTE — Telephone Encounter (Signed)
I do not see where pt went to ED to get leg swelling checked out  Call patient and mother carolyn this needs to be done asap   Also does have insurance? Ive ordered a lot of labs 04/26/20 which she needs but w/o insurance this will be expensive  What does she want to do?

## 2020-04-19 NOTE — Telephone Encounter (Signed)
Patient advised to pain in calf and leg swelling she should go to ER patient said she did not know what to tell them as to reason she needs to be seen at the ER. Advised patient that she needs to be screened for possible DVT and she advised she would go to ER now.

## 2020-04-19 NOTE — Telephone Encounter (Signed)
Left message to call office

## 2020-04-21 ENCOUNTER — Telehealth: Payer: Self-pay

## 2020-04-21 DIAGNOSIS — J309 Allergic rhinitis, unspecified: Secondary | ICD-10-CM

## 2020-04-21 MED ORDER — PROMETHAZINE HCL 12.5 MG PO TABS
12.5000 mg | ORAL_TABLET | Freq: Three times a day (TID) | ORAL | 0 refills | Status: DC | PRN
Start: 2020-04-21 — End: 2021-04-07

## 2020-04-21 MED ORDER — ZOLPIDEM TARTRATE ER 6.25 MG PO TBCR: 6.2500 mg | EXTENDED_RELEASE_TABLET | Freq: Every evening | ORAL | 0 refills | Status: DC | PRN

## 2020-04-21 MED ORDER — FLUTICASONE PROPIONATE 50 MCG/ACT NA SUSP: 2.0000 | Freq: Every day | NASAL | 5 refills | Status: DC | PRN

## 2020-04-21 MED ORDER — DICLOFENAC POTASSIUM 50 MG PO TABS
50.0000 mg | ORAL_TABLET | Freq: Two times a day (BID) | ORAL | 1 refills | Status: DC
Start: 2020-04-21 — End: 2020-04-28

## 2020-04-21 NOTE — Telephone Encounter (Signed)
Pt needs all meds refilled and sent to [facility] on [address]

## 2020-04-21 NOTE — Telephone Encounter (Signed)
Left message to return call 

## 2020-04-24 ENCOUNTER — Emergency Department: Payer: Self-pay

## 2020-04-24 ENCOUNTER — Other Ambulatory Visit: Payer: Self-pay

## 2020-04-24 ENCOUNTER — Encounter: Payer: Self-pay | Admitting: Emergency Medicine

## 2020-04-24 ENCOUNTER — Emergency Department
Admission: EM | Admit: 2020-04-24 | Discharge: 2020-04-24 | Disposition: A | Discharge status: Home / Self Care | Payer: Self-pay | Attending: Student in an Organized Health Care Education/Training Program | Admitting: Student in an Organized Health Care Education/Training Program

## 2020-04-24 DIAGNOSIS — R609 Edema, unspecified: Secondary | ICD-10-CM

## 2020-04-24 DIAGNOSIS — R600 Localized edema: Secondary | ICD-10-CM | POA: Insufficient documentation

## 2020-04-24 DIAGNOSIS — R0602 Shortness of breath: Secondary | ICD-10-CM | POA: Insufficient documentation

## 2020-04-24 DIAGNOSIS — Z79899 Other long term (current) drug therapy: Secondary | ICD-10-CM | POA: Insufficient documentation

## 2020-04-24 DIAGNOSIS — F17210 Nicotine dependence, cigarettes, uncomplicated: Secondary | ICD-10-CM | POA: Insufficient documentation

## 2020-04-24 DIAGNOSIS — I10 Essential (primary) hypertension: Secondary | ICD-10-CM | POA: Insufficient documentation

## 2020-04-24 LAB — URINALYSIS, COMPLETE (UACMP) WITH MICROSCOPIC
Bilirubin Urine: NEGATIVE
Glucose, UA: NEGATIVE mg/dL
Ketones, ur: NEGATIVE mg/dL
Nitrite: POSITIVE — AB
Protein, ur: NEGATIVE mg/dL
Specific Gravity, Urine: 1.013 (ref 1.005–1.030)
WBC, UA: >50 WBC/hpf — ABNORMAL HIGH (ref 0–5)
pH: 5 (ref 5.0–8.0)

## 2020-04-24 LAB — CBC WITH DIFFERENTIAL/PLATELET
Abs Immature Granulocytes: 0.03 10*3/uL (ref 0.00–0.07)
Basophils Absolute: 0.1 10*3/uL (ref 0.0–0.1)
Basophils Relative: 1 %
Eosinophils Absolute: 0.1 10*3/uL (ref 0.0–0.5)
Eosinophils Relative: 2 %
HCT: 37.5 % (ref 36.0–46.0)
Hemoglobin: 12.3 g/dL (ref 12.0–15.0)
Immature Granulocytes: 0 %
Lymphocytes Relative: 21 %
Lymphs Abs: 1.8 10*3/uL (ref 0.7–4.0)
MCH: 27.5 pg (ref 26.0–34.0)
MCHC: 32.8 g/dL (ref 30.0–36.0)
MCV: 83.9 fL (ref 80.0–100.0)
Monocytes Absolute: 1 10*3/uL (ref 0.1–1.0)
Monocytes Relative: 12 %
Neutro Abs: 5.2 10*3/uL (ref 1.7–7.7)
Neutrophils Relative %: 64 %
Platelets: 271 10*3/uL (ref 150–400)
RBC: 4.47 MIL/uL (ref 3.87–5.11)
RDW: 16.7 % — ABNORMAL HIGH (ref 11.5–15.5)
WBC: 8.2 10*3/uL (ref 4.0–10.5)
nRBC: 0 % (ref 0.0–0.2)

## 2020-04-24 LAB — COMPREHENSIVE METABOLIC PANEL
ALT: 71 U/L — ABNORMAL HIGH (ref 0–44)
AST: 35 U/L (ref 15–41)
Albumin: 2.8 g/dL — ABNORMAL LOW (ref 3.5–5.0)
Alkaline Phosphatase: 169 U/L — ABNORMAL HIGH (ref 38–126)
Anion gap: 10 (ref 5–15)
BUN: 9 mg/dL (ref 6–20)
CO2: 26 mmol/L (ref 22–32)
Calcium: 8.2 mg/dL — ABNORMAL LOW (ref 8.9–10.3)
Chloride: 102 mmol/L (ref 98–111)
Creatinine, Ser: 0.76 mg/dL (ref 0.44–1.00)
GFR, Estimated: >60 mL/min (ref >60)
Glucose, Bld: 115 mg/dL — ABNORMAL HIGH (ref 70–99)
Potassium: 2.7 mmol/L — CL (ref 3.5–5.1)
Sodium: 138 mmol/L (ref 135–145)
Total Bilirubin: 1.4 mg/dL — ABNORMAL HIGH (ref 0.3–1.2)
Total Protein: 7.2 g/dL (ref 6.5–8.1)

## 2020-04-24 LAB — BRAIN NATRIURETIC PEPTIDE: B Natriuretic Peptide: 45.5 pg/mL (ref 0.0–100.0)

## 2020-04-24 LAB — TROPONIN I (HIGH SENSITIVITY): Troponin I (High Sensitivity): 12 ng/L (ref <18)

## 2020-04-24 MED ORDER — FUROSEMIDE 40 MG PO TABS
40.0000 mg | ORAL_TABLET | Freq: Once | ORAL | Status: AC
  Administered 2020-04-24: 40 mg via ORAL
  Filled 2020-04-24: qty 1

## 2020-04-24 MED ORDER — FOSFOMYCIN TROMETHAMINE 3 G PO PACK
3.0000 g | PACK | Freq: Once | ORAL | Status: AC
  Administered 2020-04-24: 3 g via ORAL
  Filled 2020-04-24: qty 3

## 2020-04-24 MED ORDER — FUROSEMIDE 10 MG/ML IJ SOLN: 40.0000 mg | Freq: Once | INTRAMUSCULAR | Status: DC

## 2020-04-24 MED ORDER — AZITHROMYCIN 500 MG PO TABS: 500.0000 mg | ORAL_TABLET | Freq: Every day | ORAL | 0 refills | Status: DC

## 2020-04-24 MED ORDER — FUROSEMIDE 40 MG PO TABS: 40.0000 mg | ORAL_TABLET | Freq: Every day | ORAL | 0 refills | Status: DC

## 2020-04-24 MED ORDER — POTASSIUM CHLORIDE CRYS ER 20 MEQ PO TBCR
40.0000 meq | EXTENDED_RELEASE_TABLET | Freq: Once | ORAL | Status: AC
  Administered 2020-04-24: 40 meq via ORAL
  Filled 2020-04-24: qty 2

## 2020-04-24 MED ORDER — FUROSEMIDE 40 MG PO TABS: 40.0000 mg | ORAL_TABLET | Freq: Every day | ORAL | 11 refills | Status: DC

## 2020-04-24 MED ORDER — POTASSIUM CHLORIDE ER 10 MEQ PO TBCR: 20.0000 meq | EXTENDED_RELEASE_TABLET | Freq: Every day | ORAL | 0 refills | Status: DC

## 2020-04-24 NOTE — Telephone Encounter (Signed)
Pt called back about medication refills.

## 2020-04-24 NOTE — ED Triage Notes (Signed)
Pt to ED via POV, pt states that she has Shortness of breath and generalized edema. Pt states that she has had the symptoms for over a week. Pt states that swelling started in her ankles and leg and it has moved up her body. Pt is in NAD.
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
## 2020-04-24 NOTE — ED Notes (Signed)
Resumed care from brianna rn.  Pt alert  Eating dinner tray now.  No acute resp distress.
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
## 2020-04-25 ENCOUNTER — Telehealth: Payer: Self-pay

## 2020-04-25 NOTE — Telephone Encounter (Signed)
Pt called in and said that she just received a call from our office. I looked her up and said that she just spoke with someone about her appointments so it was probably the automated system. Every time pt would speak her phone was breaking up. I let her know that I couldn't understand what she was saying and asked her to repeat. She said well someone just called me and I didn't get to the phone. I didn't see any notes besides where patient was covid screened 3 mins earlier. I let her know again it was probably the automated system. She said said well tell me about my appointments. I let her know that she has a nurse/lab appointment tomorrow and an appt with Dr. French Ana on Friday. She proceeds to say as she was hanging up that I need to be fired. I asked why do I need to be fired and then she completely hung up telephone. This is for documentation.

## 2020-04-25 NOTE — Progress Notes (Signed)
Patient ID: Shelly Stevenson, female    DOB: 11-18-1965, 55 y.o.   MRN: [id number]  HPI  Shelly Stevenson is a 55 y/o female with a history of HTN, anxiety, depression, current tobacco use and chronic heart failure.   Echo report from 01/24/20 reviewed and showed an EF of >55% without structural changes.   Was in the ED 04/24/20 due to peripheral edema. Ultrasound negative for DVT. Antibiotic given for UTI. Potassium and lasix were given and she was released.  She presents today for her initial visit with a chief complaint of moderate fatigue upon minimal exertion. She describes this as having been present for the last several weeks. She has associated shortness of breath, pedal edema, abdominal distention, anxiety and difficulty sleeping along with this. She denies any dizziness, palpitations, chest pain or cough.   Says that she has scales at home but doesn't always weigh herself. Admits to adding salt to her food after she cooks. Drinking "plenty" of fluids but doesn't have any idea of how much fluids she drinks during the day.  Doesn't think she's picked up her potassium supplement yet.   Past Medical History:  Diagnosis Date  . Anemia    with PICA sx's   . Anxiety   . Bacterial vaginosis   . CHF (congestive heart failure) (HCC)   . Chicken pox   . Depression   . HSV (herpes simplex virus) infection   . Hypertension   . Vitamin D deficiency    Past Surgical History:  Procedure Laterality Date  . APPENDECTOMY     55 y.o.   . AUGMENTATION MAMMAPLASTY Bilateral   . COLONOSCOPY WITH PROPOFOL N/A 05/02/2017   Procedure: COLONOSCOPY WITH PROPOFOL;  Surgeon: Pasty Spillers, MD;  Location: [facility] ENDOSCOPY;  Service: Endoscopy;  Laterality: N/A;  . FRACTURE SURGERY     Left thumb   Family History  Problem Relation Age of Onset  . Arthritis Mother   . Cancer Mother        breast  . Hearing loss Mother   . Hypertension Mother   . Breast cancer Mother 43  . Arthritis Father   . Cancer  Father        colon dx'ed age 59s   . Hypertension Father   . Heart disease Father        MI  . Diabetes Father   . Mental illness Son        [facility]   Social History   Tobacco Use  . Smoking status: Current Some Day Smoker    Packs/day: 0.50    Types: Cigarettes  . Smokeless tobacco: Never Used  . Tobacco comment: <1ppd x 30 years no FH lung cancer   Substance Use Topics  . Alcohol use: Yes    Comment: socially per pt    Allergies  Allergen Reactions  . Penicillins Swelling    Angioedema  . Codeine Rash    Elevated heartrate   Prior to Admission medications   Medication Sig Start Date End Date Taking? Authorizing Provider  ALPRAZolam Prudy Feeler) 0.5 MG tablet Take 0.25 mg by mouth 2 (two) times daily as needed for anxiety.   Yes [provider]  amLODipine (NORVASC) 2.5 MG tablet Take 1 tablet (2.5 mg total) by mouth daily. 01/06/20  Yes McLean-Scocuzza, Pasty Spillers, MD  clobetasol cream (TEMOVATE) 0.05 % Apply 1 application topically 2 (two) times daily. Hands elbows not face/groin 09/20/19  Yes McLean-Scocuzza, Pasty Spillers, MD  furosemide (LASIX) 40 MG  tablet Take 1 tablet (40 mg total) by mouth daily. 04/24/20 04/24/21 Yes Willy Eddy, MD  promethazine (PHENERGAN) 12.5 MG tablet Take 1 tablet (12.5 mg total) by mouth every 8 (eight) hours as needed for nausea or vomiting. 04/21/20  Yes McLean-Scocuzza, Pasty Spillers, MD  venlafaxine XR (EFFEXOR-XR) 150 MG 24 hr capsule Take 150 mg by mouth daily. 12/02/19  Yes [provider]  zolpidem (AMBIEN CR) 6.25 MG CR tablet Take 1 tablet (6.25 mg total) by mouth at bedtime as needed for sleep. Further refills psychiatry 04/21/20  Yes McLean-Scocuzza, Pasty Spillers, MD  diclofenac (CATAFLAM) 50 MG tablet Take 1 tablet (50 mg total) by mouth 2 (two) times daily. Patient not taking: Reported on 04/26/2020 04/21/20   McLean-Scocuzza, Pasty Spillers, MD  fluticasone [facility]) 50 MCG/ACT nasal spray Place 2 sprays into both nostrils daily as needed for  allergies or rhinitis. Patient not taking: Reported on 04/26/2020 04/21/20   McLean-Scocuzza, Pasty Spillers, MD  potassium chloride (KLOR-CON) 10 MEQ tablet Take 2 tablets (20 mEq total) by mouth daily for 7 days. Patient not taking: Reported on 04/26/2020 04/24/20 05/01/20  Willy Eddy, MD    Review of Systems  Constitutional: Positive for fatigue (easily). Negative for appetite change.  HENT: Negative for congestion, postnasal drip and sore throat.   Eyes: Negative.   Respiratory: Positive for shortness of breath (easily). Negative for cough and chest tightness.   Cardiovascular: Positive for leg swelling. Negative for chest pain and palpitations.  Gastrointestinal: Positive for abdominal distention. Negative for abdominal pain.  Endocrine: Negative.   Genitourinary: Negative.   Musculoskeletal: Negative for back pain and neck pain.  Allergic/Immunologic: Negative.   Neurological: Negative for dizziness and light-headedness.  Hematological: Negative for adenopathy. Does not bruise/bleed easily.  Psychiatric/Behavioral: Positive for sleep disturbance (sleeping in recliner due to shortness of breath). Negative for dysphoric mood. The patient is nervous/anxious.     Vitals:   04/26/20 1029  BP: (!) 136/92  Pulse: 84  Resp: 18  SpO2: 95%  Weight: 178 lb 6 oz (80.9 kg)  Height: 5\' 5"  (1.651 m)   Wt Readings from Last 3 Encounters:  04/26/20 178 lb 6 oz (80.9 kg)  04/24/20 181 lb (82.1 kg)  04/13/20 146 lb (66.2 kg)   Lab Results  Component Value Date   CREATININE 0.76 04/24/2020   CREATININE 0.97 07/13/2019   CREATININE 0.75 03/24/2017    Physical Exam Vitals and nursing note reviewed.  Constitutional:      Appearance: She is well-developed.  HENT:     Head: Normocephalic and atraumatic.  Neck:     Vascular: No JVD.  Cardiovascular:     Rate and Rhythm: Normal rate and regular rhythm.  Pulmonary:     Effort: Pulmonary effort is normal. No respiratory distress.      Breath sounds: No wheezing or rales.  Abdominal:     Palpations: Abdomen is soft.     Tenderness: There is no abdominal tenderness.  Musculoskeletal:     Cervical back: Neck supple.     Right lower leg: No tenderness. Edema (2+ pitting) present.     Left lower leg: No tenderness. Edema (2+ pitting) present.  Skin:    General: Skin is warm and dry.  Neurological:     General: No focal deficit present.     Mental Status: She is alert and oriented to person, place, and time.  Psychiatric:        Mood and Affect: Mood is anxious.  Behavior: Behavior normal.    Assessment & Plan:  1: Chronic heart failure with preserved ejection fraction without structural changes- - NYHA class III - euvolemic today - not weighing daily but does have scales; instructed to weigh daily and call for an overnight weight gain of > 2 pounds or a weekly weight gain of > 5 pounds - not cooking with salt but does add salt to her food after it's been cooked; reviewed the importance of looking at food labels and to not add any salt to her food; written dietary information was given to her about this - has a plastic cup from [employer] and says that she drinks ~ 3 of them daily but she's not sure how much it holds; instructed her to measure how many ounces this cup holds and that she should have around 60 ounces of fluid daily - BNP 04/24/20 was 45.5  2: HTN- - BP looks good today - had telemedicine visit with PCP (McLean-Scocuzza) 04/13/20; returns on 04/28/20 - BMP 04/24/20 reviewed and showed sodium 138, potassium 2.7, creatinine 0.76 and GFR >60 - potassium replaced in ED; labs checked today by PCP's office; she is going to pick up the potassium prescription  3: Tobacco use- - smoking but "not much" and she's unable to quantify how much she smokes - complete cessation discussed for 3 minutes with her  4: Lymphedema- - stage 2 - elevating her legs but says that her edema persists - not very active right  now due to the swelling - instructed to get compression socks and put them on every morning and remove them at bedtime - could consider compression boots if edema persists   Some medication bottles were brought but didn't bring everything that she's taking.   Return in 1 month or sooner for any questions/problems before then.

## 2020-04-26 ENCOUNTER — Encounter: Payer: Self-pay | Admitting: Family

## 2020-04-26 ENCOUNTER — Ambulatory Visit: Payer: PRIVATE HEALTH INSURANCE | Attending: Family | Admitting: Family

## 2020-04-26 ENCOUNTER — Ambulatory Visit (INDEPENDENT_AMBULATORY_CARE_PROVIDER_SITE_OTHER): Payer: PRIVATE HEALTH INSURANCE

## 2020-04-26 ENCOUNTER — Other Ambulatory Visit: Payer: Self-pay | Admitting: Internal Medicine

## 2020-04-26 ENCOUNTER — Other Ambulatory Visit: Payer: Self-pay

## 2020-04-26 ENCOUNTER — Other Ambulatory Visit (INDEPENDENT_AMBULATORY_CARE_PROVIDER_SITE_OTHER): Payer: PRIVATE HEALTH INSURANCE

## 2020-04-26 ENCOUNTER — Encounter: Payer: Self-pay | Admitting: Internal Medicine

## 2020-04-26 VITALS — BP 136/92 | HR 84 | Resp 18 | Ht 65.0 in | Wt 178.4 lb

## 2020-04-26 VITALS — BP 113/79 | HR 99

## 2020-04-26 DIAGNOSIS — F32A Depression, unspecified: Secondary | ICD-10-CM | POA: Insufficient documentation

## 2020-04-26 DIAGNOSIS — Z8249 Family history of ischemic heart disease and other diseases of the circulatory system: Secondary | ICD-10-CM | POA: Insufficient documentation

## 2020-04-26 DIAGNOSIS — F419 Anxiety disorder, unspecified: Secondary | ICD-10-CM | POA: Insufficient documentation

## 2020-04-26 DIAGNOSIS — R5383 Other fatigue: Secondary | ICD-10-CM | POA: Insufficient documentation

## 2020-04-26 DIAGNOSIS — E876 Hypokalemia: Secondary | ICD-10-CM

## 2020-04-26 DIAGNOSIS — Z113 Encounter for screening for infections with a predominantly sexual mode of transmission: Secondary | ICD-10-CM

## 2020-04-26 DIAGNOSIS — I10 Essential (primary) hypertension: Secondary | ICD-10-CM

## 2020-04-26 DIAGNOSIS — Z1329 Encounter for screening for other suspected endocrine disorder: Secondary | ICD-10-CM

## 2020-04-26 DIAGNOSIS — E559 Vitamin D deficiency, unspecified: Secondary | ICD-10-CM

## 2020-04-26 DIAGNOSIS — F17210 Nicotine dependence, cigarettes, uncomplicated: Secondary | ICD-10-CM | POA: Insufficient documentation

## 2020-04-26 DIAGNOSIS — Z13818 Encounter for screening for other digestive system disorders: Secondary | ICD-10-CM

## 2020-04-26 DIAGNOSIS — Z885 Allergy status to narcotic agent status: Secondary | ICD-10-CM | POA: Insufficient documentation

## 2020-04-26 DIAGNOSIS — I5032 Chronic diastolic (congestive) heart failure: Secondary | ICD-10-CM

## 2020-04-26 DIAGNOSIS — I110 Hypertensive heart disease with heart failure: Secondary | ICD-10-CM | POA: Insufficient documentation

## 2020-04-26 DIAGNOSIS — R319 Hematuria, unspecified: Secondary | ICD-10-CM

## 2020-04-26 DIAGNOSIS — Z880 Allergy status to penicillin: Secondary | ICD-10-CM | POA: Insufficient documentation

## 2020-04-26 DIAGNOSIS — D509 Iron deficiency anemia, unspecified: Secondary | ICD-10-CM

## 2020-04-26 DIAGNOSIS — Z720 Tobacco use: Secondary | ICD-10-CM

## 2020-04-26 DIAGNOSIS — I890 Lymphedema, not elsewhere classified: Secondary | ICD-10-CM | POA: Insufficient documentation

## 2020-04-26 DIAGNOSIS — Z0184 Encounter for antibody response examination: Secondary | ICD-10-CM

## 2020-04-26 DIAGNOSIS — Z79899 Other long term (current) drug therapy: Secondary | ICD-10-CM | POA: Insufficient documentation

## 2020-04-26 DIAGNOSIS — R825 Elevated urine levels of drugs, medicaments and biological substances: Secondary | ICD-10-CM

## 2020-04-26 LAB — CBC WITH DIFFERENTIAL/PLATELET
Basophils Absolute: 0.1 10*3/uL (ref 0.0–0.1)
Basophils Relative: 0.8 % (ref 0.0–3.0)
Eosinophils Absolute: 0.1 10*3/uL (ref 0.0–0.7)
Eosinophils Relative: 1.4 % (ref 0.0–5.0)
HCT: 37.5 % (ref 36.0–46.0)
Hemoglobin: 12.1 g/dL (ref 12.0–15.0)
Lymphocytes Relative: 30.3 % (ref 12.0–46.0)
Lymphs Abs: 2.3 10*3/uL (ref 0.7–4.0)
MCHC: 32.2 g/dL (ref 30.0–36.0)
MCV: 83.8 fl (ref 78.0–100.0)
Monocytes Absolute: 1 10*3/uL (ref 0.1–1.0)
Monocytes Relative: 12.7 % — ABNORMAL HIGH (ref 3.0–12.0)
Neutro Abs: 4.2 10*3/uL (ref 1.4–7.7)
Neutrophils Relative %: 54.8 % (ref 43.0–77.0)
Platelets: 303 10*3/uL (ref 150.0–400.0)
RBC: 4.48 Mil/uL (ref 3.87–5.11)
RDW: 16.7 % — ABNORMAL HIGH (ref 11.5–15.5)
WBC: 7.6 10*3/uL (ref 4.0–10.5)

## 2020-04-26 LAB — COMPREHENSIVE METABOLIC PANEL WITH GFR
ALT: 47 U/L — ABNORMAL HIGH (ref 0–35)
AST: 28 U/L (ref 0–37)
Albumin: 3 g/dL — ABNORMAL LOW (ref 3.5–5.2)
Alkaline Phosphatase: 205 U/L — ABNORMAL HIGH (ref 39–117)
BUN: 10 mg/dL (ref 6–23)
CO2: 30 meq/L (ref 19–32)
Calcium: 8.4 mg/dL (ref 8.4–10.5)
Chloride: 100 meq/L (ref 96–112)
Creatinine, Ser: 0.66 mg/dL (ref 0.40–1.20)
GFR: 99.07 mL/min
Glucose, Bld: 98 mg/dL (ref 70–99)
Potassium: 3 meq/L — ABNORMAL LOW (ref 3.5–5.1)
Sodium: 136 meq/L (ref 135–145)
Total Bilirubin: 1.1 mg/dL (ref 0.2–1.2)
Total Protein: 6.8 g/dL (ref 6.0–8.3)

## 2020-04-26 LAB — LIPID PANEL
Cholesterol: 114 mg/dL (ref 0–200)
HDL: 26.2 mg/dL — ABNORMAL LOW (ref >39.00)
LDL Cholesterol: 73 mg/dL (ref 0–99)
NonHDL: 87.37
Total CHOL/HDL Ratio: 4
Triglycerides: 73 mg/dL (ref 0.0–149.0)
VLDL: 14.6 mg/dL (ref 0.0–40.0)

## 2020-04-26 LAB — VITAMIN D 25 HYDROXY (VIT D DEFICIENCY, FRACTURES): VITD: 16.36 ng/mL — ABNORMAL LOW (ref 30.00–100.00)

## 2020-04-26 LAB — TSH: TSH: 2.53 u[IU]/mL (ref 0.35–4.50)

## 2020-04-26 MED ORDER — POTASSIUM CHLORIDE CRYS ER 20 MEQ PO TBCR: 20.0000 meq | EXTENDED_RELEASE_TABLET | Freq: Two times a day (BID) | ORAL | 1 refills | Status: DC

## 2020-04-26 NOTE — Patient Instructions (Addendum)
Begin weighing daily and call for an overnight weight gain of > 2 pounds or a weekly weight gain of >5 pounds.   Drink about 60 ounces of fluids daily.    Do not add any salt to your food   Get compression socks and put them on every morning and remove them at bedtime   Low-Sodium Eating Plan Sodium, which is an element that makes up salt, helps you maintain a healthy balance of fluids in your body. Too much sodium can increase your blood pressure and cause fluid and waste to be held in your body. Your health care provider or dietitian may recommend following this plan if you have high blood pressure (hypertension), kidney disease, liver disease, or heart failure. Eating less sodium can help lower your blood pressure, reduce swelling, and protect your heart, liver, and kidneys. What are tips for following this plan? Reading food labels  The Nutrition Facts label lists the amount of sodium in one serving of the food. If you eat more than one serving, you must multiply the listed amount of sodium by the number of servings.  Choose foods with less than 140 mg of sodium per serving.  Avoid foods with 300 mg of sodium or more per serving. Shopping  Look for lower-sodium products, often labeled as "low-sodium" or "no salt added."  Always check the sodium content, even if foods are labeled as "unsalted" or "no salt added."  Buy fresh foods. ? Avoid canned foods and pre-made or frozen meals. ? Avoid canned, cured, or processed meats.  Buy breads that have less than 80 mg of sodium per slice.   Cooking  Eat more home-cooked food and less restaurant, buffet, and fast food.  Avoid adding salt when cooking. Use salt-free seasonings or herbs instead of table salt or sea salt. Check with your health care provider or pharmacist before using salt substitutes.  Cook with plant-based oils, such as canola, sunflower, or olive oil.   Meal planning  When eating at a restaurant, ask that your  food be prepared with less salt or no salt, if possible. Avoid dishes labeled as brined, pickled, cured, smoked, or made with soy sauce, miso, or teriyaki sauce.  Avoid foods that contain MSG (monosodium glutamate). MSG is sometimes added to [place] food, bouillon, and some canned foods.  Make meals that can be grilled, baked, poached, roasted, or steamed. These are generally made with less sodium. General information Most people on this plan should limit their sodium intake to 1,500-2,000 mg (milligrams) of sodium each day. What foods should I eat? Fruits Fresh, frozen, or canned fruit. Fruit juice. Vegetables Fresh or frozen vegetables. "No salt added" canned vegetables. "No salt added" tomato sauce and paste. Low-sodium or reduced-sodium tomato and vegetable juice. Grains Low-sodium cereals, including oats, puffed wheat and rice, and shredded wheat. Low-sodium crackers. Unsalted rice. Unsalted pasta. Low-sodium bread. Whole-grain breads and whole-grain pasta. Meats and other proteins Fresh or frozen (no salt added) meat, poultry, seafood, and fish. Low-sodium canned tuna and salmon. Unsalted nuts. Dried peas, beans, and lentils without added salt. Unsalted canned beans. Eggs. Unsalted nut butters. Dairy Milk. Soy milk. Cheese that is naturally low in sodium, such as ricotta cheese, fresh mozzarella, or Swiss cheese. Low-sodium or reduced-sodium cheese. Cream cheese. Yogurt. Seasonings and condiments Fresh and dried herbs and spices. Salt-free seasonings. Low-sodium mustard and ketchup. Sodium-free salad dressing. Sodium-free light mayonnaise. Fresh or refrigerated horseradish. Lemon juice. Vinegar. Other foods Homemade, reduced-sodium, or low-sodium soups. Unsalted popcorn  and pretzels. Low-salt or salt-free chips. The items listed above may not be a complete list of foods and beverages you can eat. Contact a dietitian for more information. What foods should I  avoid? Vegetables Sauerkraut, pickled vegetables, and relishes. Olives. [place] fries. Onion rings. Regular canned vegetables (not low-sodium or reduced-sodium). Regular canned tomato sauce and paste (not low-sodium or reduced-sodium). Regular tomato and vegetable juice (not low-sodium or reduced-sodium). Frozen vegetables in sauces. Grains Instant hot cereals. Bread stuffing, pancake, and biscuit mixes. Croutons. Seasoned rice or pasta mixes. Noodle soup cups. Boxed or frozen macaroni and cheese. Regular salted crackers. Self-rising flour. Meats and other proteins Meat or fish that is salted, canned, smoked, spiced, or pickled. Precooked or cured meat, such as sausages or meat loaves. Tomasa Blase. Ham. Pepperoni. Hot dogs. Corned beef. Chipped beef. Salt pork. Jerky. Pickled herring. Anchovies and sardines. Regular canned tuna. Salted nuts. Dairy Processed cheese and cheese spreads. Hard cheeses. Cheese curds. Blue cheese. Feta cheese. String cheese. Regular cottage cheese. Buttermilk. Canned milk. Fats and oils Salted butter. Regular margarine. Ghee. Bacon fat. Seasonings and condiments Onion salt, garlic salt, seasoned salt, table salt, and sea salt. Canned and packaged gravies. Worcestershire sauce. Tartar sauce. Barbecue sauce. Teriyaki sauce. Soy sauce, including reduced-sodium. Steak sauce. Fish sauce. Oyster sauce. Cocktail sauce. Horseradish that you find on the shelf. Regular ketchup and mustard. Meat flavorings and tenderizers. Bouillon cubes. Hot sauce. Pre-made or packaged marinades. Pre-made or packaged taco seasonings. Relishes. Regular salad dressings. Salsa. Other foods Salted popcorn and pretzels. Corn chips and puffs. Potato and tortilla chips. Canned or dried soups. Pizza. Frozen entrees and pot pies. The items listed above may not be a complete list of foods and beverages you should avoid. Contact a dietitian for more information. Summary  Eating less sodium can help lower your blood  pressure, reduce swelling, and protect your heart, liver, and kidneys.  Most people on this plan should limit their sodium intake to 1,500-2,000 mg (milligrams) of sodium each day.  Canned, boxed, and frozen foods are high in sodium. Restaurant foods, fast foods, and pizza are also very high in sodium. You also get sodium by adding salt to food.  Try to cook at home, eat more fresh fruits and vegetables, and eat less fast food and canned, processed, or prepared foods. This information is not intended to replace advice given to you by your health care provider. Make sure you discuss any questions you have with your health care provider. Document Revised: 04/23/2019 Document Reviewed: 02/17/2019 Elsevier Patient Education  2021 [employer].

## 2020-04-26 NOTE — Progress Notes (Signed)
Patient is here for a BP check due to bp being high at last visit, as per patient.  Currently patients BP is 113/79 and BPM is 99.  Patient has no complaints of headaches, blurry vision, chest pain, arm pain, light headedness, dizziness, and nor jaw pain. Please see previous note for order.

## 2020-04-26 NOTE — Addendum Note (Signed)
Addended by: Bonnell Public I on: 04/26/2020 09:32 AM   Modules accepted: Orders

## 2020-04-27 ENCOUNTER — Telehealth: Payer: Self-pay | Admitting: Internal Medicine

## 2020-04-27 ENCOUNTER — Other Ambulatory Visit: Payer: Self-pay | Admitting: Internal Medicine

## 2020-04-27 DIAGNOSIS — E611 Iron deficiency: Secondary | ICD-10-CM

## 2020-04-27 LAB — URINALYSIS, ROUTINE W REFLEX MICROSCOPIC
Bilirubin, UA: NEGATIVE
Glucose, UA: NEGATIVE
Ketones, UA: NEGATIVE
Nitrite, UA: POSITIVE — AB
Protein,UA: NEGATIVE
RBC, UA: NEGATIVE
Specific Gravity, UA: 1.008 (ref 1.005–1.030)
Urobilinogen, Ur: 0.2 mg/dL (ref 0.2–1.0)
pH, UA: 7 (ref 5.0–7.5)

## 2020-04-27 LAB — MICROSCOPIC EXAMINATION
Casts: NONE SEEN /lpf
WBC, UA: >30 /hpf — AB (ref 0–5)

## 2020-04-27 LAB — IRON,TIBC AND FERRITIN PANEL
%SAT: 8 % (calc) — ABNORMAL LOW (ref 16–45)
Ferritin: 80 ng/mL (ref 16–232)
Iron: 27 ug/dL — ABNORMAL LOW (ref 45–160)
TIBC: 332 mcg/dL (calc) (ref 250–450)

## 2020-04-27 LAB — RPR: RPR Ser Ql: NONREACTIVE

## 2020-04-27 LAB — HEPATITIS B SURFACE ANTIBODY, QUANTITATIVE: Hep B S AB Quant (Post): 16 m[IU]/mL (ref >=10)

## 2020-04-27 LAB — HIV ANTIBODY (ROUTINE TESTING W REFLEX): HIV 1&2 Ab, 4th Generation: NONREACTIVE

## 2020-04-27 LAB — HEPATITIS C ANTIBODY
Hepatitis C Ab: NONREACTIVE
SIGNAL TO CUT-OFF: 0.06 (ref <1.00)

## 2020-04-27 MED ORDER — FERROUS SULFATE 324 (65 FE) MG PO TBEC: 1.0000 | DELAYED_RELEASE_TABLET | Freq: Every day | ORAL | 3 refills | Status: DC

## 2020-04-27 NOTE — Telephone Encounter (Signed)
Patient called in for result from labs on Wednesday 04-26-20

## 2020-04-27 NOTE — Telephone Encounter (Signed)
  Is she taking norvasc 2.5 mg daily for blood pressure pressure was good in the clinic?    Cholesterol improved though HDL low  Potassium low prescribed potassium to take 2 per day  Liver enzymes elevated  -one is she trending down ALT but alkaline phos is trending up  -we may need to do US abdomen in the future    Protein low rec premier protein shakes over the counter 1-2 x per day  Vitamin D low rec vitamin D3 5000 IU daily otc    Thyroid lab normal Blood cts normal but iron is low rec take multivitamin with iron daily sent iron buy multivitamin     Some labs pending  -Dr French Ana McLean-Scocuzza

## 2020-04-28 ENCOUNTER — Ambulatory Visit (INDEPENDENT_AMBULATORY_CARE_PROVIDER_SITE_OTHER): Payer: Self-pay | Admitting: Internal Medicine

## 2020-04-28 ENCOUNTER — Other Ambulatory Visit: Payer: Self-pay | Admitting: Internal Medicine

## 2020-04-28 ENCOUNTER — Encounter: Payer: Self-pay | Admitting: Internal Medicine

## 2020-04-28 ENCOUNTER — Other Ambulatory Visit: Payer: Self-pay

## 2020-04-28 VITALS — BP 134/90 | HR 103 | Temp 98.2°F | Ht 65.0 in | Wt 174.0 lb

## 2020-04-28 DIAGNOSIS — F32A Depression, unspecified: Secondary | ICD-10-CM

## 2020-04-28 DIAGNOSIS — E611 Iron deficiency: Secondary | ICD-10-CM

## 2020-04-28 DIAGNOSIS — E876 Hypokalemia: Secondary | ICD-10-CM

## 2020-04-28 DIAGNOSIS — F419 Anxiety disorder, unspecified: Secondary | ICD-10-CM

## 2020-04-28 DIAGNOSIS — I10 Essential (primary) hypertension: Secondary | ICD-10-CM

## 2020-04-28 DIAGNOSIS — Z1231 Encounter for screening mammogram for malignant neoplasm of breast: Secondary | ICD-10-CM

## 2020-04-28 DIAGNOSIS — N3000 Acute cystitis without hematuria: Secondary | ICD-10-CM

## 2020-04-28 DIAGNOSIS — Z0283 Encounter for blood-alcohol and blood-drug test: Secondary | ICD-10-CM

## 2020-04-28 DIAGNOSIS — R600 Localized edema: Secondary | ICD-10-CM

## 2020-04-28 DIAGNOSIS — L409 Psoriasis, unspecified: Secondary | ICD-10-CM

## 2020-04-28 MED ORDER — CIPROFLOXACIN HCL 500 MG PO TABS: 500.0000 mg | ORAL_TABLET | Freq: Two times a day (BID) | ORAL | 0 refills | Status: DC

## 2020-04-28 MED ORDER — VENLAFAXINE HCL ER 150 MG PO CP24
150.0000 mg | ORAL_CAPSULE | Freq: Every day | ORAL | 3 refills | Status: DC
Start: 2020-04-28 — End: 2021-08-06

## 2020-04-28 MED ORDER — FUROSEMIDE 40 MG PO TABS
40.0000 mg | ORAL_TABLET | Freq: Every day | ORAL | 5 refills | Status: DC
Start: 2020-04-28 — End: 2021-04-07

## 2020-04-28 MED ORDER — CLOBETASOL PROPIONATE 0.05 % EX CREA: 1.0000 "application " | TOPICAL_CREAM | Freq: Two times a day (BID) | CUTANEOUS | 5 refills | Status: DC

## 2020-04-28 MED ORDER — POTASSIUM CHLORIDE CRYS ER 20 MEQ PO TBCR
20.0000 meq | EXTENDED_RELEASE_TABLET | Freq: Two times a day (BID) | ORAL | 3 refills | Status: DC
Start: 2020-04-28 — End: 2021-04-07

## 2020-04-28 MED ORDER — AMLODIPINE BESYLATE 2.5 MG PO TABS: 2.5000 mg | ORAL_TABLET | Freq: Every day | ORAL | 3 refills | Status: DC

## 2020-04-28 NOTE — Telephone Encounter (Signed)
Patient seen in person today.  ?

## 2020-04-28 NOTE — Patient Instructions (Signed)

## 2020-04-28 NOTE — Progress Notes (Signed)
Current status:  PATIENT IS OVERDUE FOR BREAST CANCER SCREENING

## 2020-04-28 NOTE — Telephone Encounter (Signed)
Patient seen in office
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
## 2020-04-29 LAB — URINE CULTURE

## 2020-05-01 ENCOUNTER — Telehealth: Payer: Self-pay

## 2020-05-01 NOTE — Telephone Encounter (Signed)
Pt returned your call about lab results 

## 2020-05-02 LAB — DRUG MONITORING, PANEL 8 WITH CONFIRMATION, URINE
6 Acetylmorphine: NEGATIVE ng/mL (ref <10)
Alcohol Metabolites: POSITIVE ng/mL — AB
Alphahydroxyalprazolam: NEGATIVE ng/mL (ref <25)
Alphahydroxymidazolam: NEGATIVE ng/mL (ref <50)
Alphahydroxytriazolam: NEGATIVE ng/mL (ref <50)
Aminoclonazepam: NEGATIVE ng/mL (ref <25)
Amphetamines: NEGATIVE ng/mL (ref <500)
Benzodiazepines: POSITIVE ng/mL — AB (ref <100)
Buprenorphine, Urine: NEGATIVE ng/mL (ref <5)
Cocaine Metabolite: NEGATIVE ng/mL (ref <150)
Creatinine: 113 mg/dL
Ethyl Glucuronide (ETG): 5755 ng/mL — ABNORMAL HIGH (ref <500)
Ethyl Sulfate (ETS): 1621 ng/mL — ABNORMAL HIGH (ref <100)
Hydroxyethylflurazepam: NEGATIVE ng/mL (ref <50)
Lorazepam: NEGATIVE ng/mL (ref <50)
MDMA: NEGATIVE ng/mL (ref <500)
Marijuana Metabolite: NEGATIVE ng/mL (ref <20)
Nordiazepam: NEGATIVE ng/mL (ref <50)
Opiates: NEGATIVE ng/mL (ref <100)
Oxazepam: NEGATIVE ng/mL (ref <50)
Oxidant: NEGATIVE ug/mL
Oxycodone: NEGATIVE ng/mL (ref <100)
Temazepam: 61 ng/mL — ABNORMAL HIGH (ref <50)
pH: 7.5 (ref 4.5–9.0)

## 2020-05-02 LAB — DM TEMPLATE

## 2020-05-02 NOTE — Telephone Encounter (Signed)
Still waiting on drug screening for xanax   Can take potassium by nature made or natures bounty potassium 99 mcg daily

## 2020-05-02 NOTE — Telephone Encounter (Signed)
Pt returned your call.  

## 2020-05-02 NOTE — Telephone Encounter (Signed)
-----   Message from Bevelyn Buckles, MD sent at 04/28/2020  7:43 AM EST ----- Is she taking norvasc 2.5 mg daily for blood pressure pressure was good in the clinic?   Urine appears like UTI sent c[facility] to [facility] use Good Rx coupon ask pharmacy  Cholesterol improved though HDL low  Potassium low prescribed potassium to take 2 per day  Liver enzymes elevated  -one is she trending down ALT but alkaline phos is trending up  -we may need to do US abdomen in the future   Protein low rec premier protein shakes over the counter 1-2 x per day  Vitamin D low rec vitamin D3 5000 IU daily otc   Thyroid lab normal Blood cts normal but iron is low rec take multivitamin with iron daily sent iron buy multivitamin

## 2020-05-02 NOTE — Telephone Encounter (Signed)
Left message to return call 

## 2020-05-02 NOTE — Telephone Encounter (Signed)
Patient informed and verbalized understanding.   Patient states that potassium was too expensive and wanted to know if there was something over the counter she could take.   Patient inquiring about xanax being sent in.   Please advise

## 2020-05-03 ENCOUNTER — Telehealth: Payer: Self-pay | Admitting: Internal Medicine

## 2020-05-03 ENCOUNTER — Encounter: Payer: Self-pay | Admitting: Internal Medicine

## 2020-05-03 NOTE — Telephone Encounter (Signed)
lft vm for pt to call office regarding referral to [facility].

## 2020-05-03 NOTE — Telephone Encounter (Signed)
Left message to return call 

## 2020-05-03 NOTE — Telephone Encounter (Signed)
lft vm for pt to call ofc to sch echo. Thanks

## 2020-05-03 NOTE — Telephone Encounter (Signed)
McLean-Scocuzza, Pasty Spillers, MD  Tilford Pillar, CMA Cc: Youngblood, Rasheedah R UDS + alcohol and benzos though I have not prescribed these abnormal and will need to f/u psychiatry for anxiety asap

## 2020-05-12 ENCOUNTER — Telehealth: Payer: Self-pay | Admitting: Internal Medicine

## 2020-05-12 ENCOUNTER — Ambulatory Visit: Payer: Self-pay | Admitting: Internal Medicine

## 2020-05-12 NOTE — Telephone Encounter (Signed)
Patient no-showed today's appointment; appointment was for 2/11 at 2:00, provider notified for review of record.  Letter sent for patient to call in and re-schedule.

## 2020-05-26 ENCOUNTER — Ambulatory Visit: Payer: PRIVATE HEALTH INSURANCE | Admitting: Family

## 2020-05-26 ENCOUNTER — Telehealth: Payer: Self-pay | Admitting: Family

## 2020-05-26 NOTE — Telephone Encounter (Signed)
Patient did not show for her [facility] Failure [facility] appointment on 05/26/20. Will attempt to reschedule.  

## 2020-08-31 ENCOUNTER — Telehealth: Payer: Self-pay

## 2020-08-31 NOTE — Telephone Encounter (Signed)
Pt's mother Eber Jones came in and states that [facility] [place]. The nurse at the jail states that the pt needs her blood pressure medication sent to to [address] [place]. She also states that she needs depression medication. They will only allow non controlled. They gave an example of Remeron 30mg . Please call the mother back at [phone].

## 2020-09-01 NOTE — Telephone Encounter (Signed)
Patient's mother informed that all of Patient's medications had been sent in 04/2020 to the requested pharmacy and should have refills.   Informed her that according to Dr French Ana McLean-Scocuzza we no longer manage psychiatric medications for the Patient and she was to establish with a psychiatrist. Informed that per Dr French Ana McLean-Scocuzza the Patient would have to go through the jail to be placed with psychiatry and have medication given.

## 2020-09-28 ENCOUNTER — Ambulatory Visit: Payer: Self-pay | Admitting: Internal Medicine

## 2020-09-28 ENCOUNTER — Telehealth: Payer: Self-pay | Admitting: Internal Medicine

## 2020-09-28 DIAGNOSIS — Z0289 Encounter for other administrative examinations: Secondary | ICD-10-CM

## 2020-09-28 NOTE — Telephone Encounter (Signed)
Patient no-showed today's appointment; appointment was for 09/28/20, provider notified for review of record. Letter sent for patient to call in and re-schedule.

## 2020-10-08 ENCOUNTER — Encounter: Payer: Self-pay | Admitting: Emergency Medicine

## 2020-10-08 ENCOUNTER — Other Ambulatory Visit: Payer: Self-pay

## 2020-10-08 ENCOUNTER — Emergency Department: Payer: Self-pay

## 2020-10-08 ENCOUNTER — Emergency Department
Admission: EM | Admit: 2020-10-08 | Discharge: 2020-10-08 | Disposition: A | Discharge status: Home / Self Care | Payer: Self-pay | Attending: Emergency Medicine | Admitting: Emergency Medicine

## 2020-10-08 DIAGNOSIS — F17210 Nicotine dependence, cigarettes, uncomplicated: Secondary | ICD-10-CM | POA: Insufficient documentation

## 2020-10-08 DIAGNOSIS — R188 Other ascites: Secondary | ICD-10-CM

## 2020-10-08 DIAGNOSIS — R600 Localized edema: Secondary | ICD-10-CM | POA: Insufficient documentation

## 2020-10-08 DIAGNOSIS — Z79899 Other long term (current) drug therapy: Secondary | ICD-10-CM | POA: Insufficient documentation

## 2020-10-08 DIAGNOSIS — I10 Essential (primary) hypertension: Secondary | ICD-10-CM | POA: Insufficient documentation

## 2020-10-08 DIAGNOSIS — R072 Precordial pain: Secondary | ICD-10-CM | POA: Insufficient documentation

## 2020-10-08 LAB — BRAIN NATRIURETIC PEPTIDE: B Natriuretic Peptide: 48.9 pg/mL (ref 0.0–100.0)

## 2020-10-08 LAB — BASIC METABOLIC PANEL
Anion gap: 8 (ref 5–15)
BUN: 17 mg/dL (ref 6–20)
CO2: 25 mmol/L (ref 22–32)
Calcium: 9.1 mg/dL (ref 8.9–10.3)
Chloride: 105 mmol/L (ref 98–111)
Creatinine, Ser: 0.66 mg/dL (ref 0.44–1.00)
GFR, Estimated: >60 mL/min (ref >60)
Glucose, Bld: 106 mg/dL — ABNORMAL HIGH (ref 70–99)
Potassium: 3.1 mmol/L — ABNORMAL LOW (ref 3.5–5.1)
Sodium: 138 mmol/L (ref 135–145)

## 2020-10-08 LAB — TROPONIN I (HIGH SENSITIVITY)
Troponin I (High Sensitivity): 7 ng/L (ref <18)
Troponin I (High Sensitivity): 6 ng/L (ref <18)

## 2020-10-08 LAB — CBC
HCT: 39.9 % (ref 36.0–46.0)
Hemoglobin: 12.7 g/dL (ref 12.0–15.0)
MCH: 30.5 pg (ref 26.0–34.0)
MCHC: 31.8 g/dL (ref 30.0–36.0)
MCV: 95.9 fL (ref 80.0–100.0)
Platelets: 326 10*3/uL (ref 150–400)
RBC: 4.16 MIL/uL (ref 3.87–5.11)
RDW: 15 % (ref 11.5–15.5)
WBC: 6.6 10*3/uL (ref 4.0–10.5)
nRBC: 0 % (ref 0.0–0.2)

## 2020-10-08 LAB — HEPATIC FUNCTION PANEL
ALT: 16 U/L (ref 0–44)
AST: 30 U/L (ref 15–41)
Albumin: 3.4 g/dL — ABNORMAL LOW (ref 3.5–5.0)
Alkaline Phosphatase: 224 U/L — ABNORMAL HIGH (ref 38–126)
Bilirubin, Direct: 0.4 mg/dL — ABNORMAL HIGH (ref 0.0–0.2)
Indirect Bilirubin: 0.8 mg/dL (ref 0.3–0.9)
Total Bilirubin: 1.2 mg/dL (ref 0.3–1.2)
Total Protein: 8.1 g/dL (ref 6.5–8.1)

## 2020-10-08 LAB — D-DIMER, QUANTITATIVE: D-Dimer, Quant: 3.65 ug/mL-FEU — ABNORMAL HIGH (ref 0.00–0.50)

## 2020-10-08 MED ORDER — IOHEXOL 350 MG/ML SOLN
100.0000 mL | Freq: Once | INTRAVENOUS | Status: AC | PRN
  Administered 2020-10-08: 100 mL via INTRAVENOUS

## 2020-10-08 MED ORDER — FUROSEMIDE 40 MG PO TABS: 40.0000 mg | ORAL_TABLET | Freq: Every day | ORAL | 11 refills | Status: DC

## 2020-10-08 MED ORDER — MUPIROCIN 2 % EX OINT
TOPICAL_OINTMENT | Freq: Once | CUTANEOUS | Status: AC
  Filled 2020-10-08: qty 22

## 2020-10-08 MED ORDER — ZOLPIDEM TARTRATE ER 6.25 MG PO TBCR: 6.2500 mg | EXTENDED_RELEASE_TABLET | Freq: Every evening | ORAL | 1 refills | Status: AC | PRN

## 2020-10-08 MED ORDER — FUROSEMIDE 40 MG PO TABS
40.0000 mg | ORAL_TABLET | Freq: Once | ORAL | Status: AC
  Administered 2020-10-08: 40 mg via ORAL
  Filled 2020-10-08: qty 1

## 2020-10-08 MED ORDER — MUPIROCIN CALCIUM 2 % EX CREA
TOPICAL_CREAM | Freq: Once | CUTANEOUS | Status: DC
  Filled 2020-10-08: qty 15

## 2020-10-08 MED ORDER — MUPIROCIN CALCIUM 2 % EX CREA: 1.0000 "application " | TOPICAL_CREAM | Freq: Two times a day (BID) | CUTANEOUS | 0 refills | Status: DC
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
## 2020-10-08 NOTE — Discharge Instructions (Addendum)
Please return for worse pain or any shortness of breath.  Please follow-up with your regular doctor.  Have her check our computer system to review your CT scan and lab work.  Your alkaline phosphatase is elevated somewhat but the rest of her lab work is okay.  CT shows a little bit of fluid in the abdomen as well.  This and the swelling in your legs may be due to your congestive failure.  Your doctor will have to check on you further to make sure there is nothing else going on.  Everything else we have done here has been okay.  I will give you a prescription for Lasix 40 once a day to help with the fluid this is the same medicine you want before I will also give you the prescription you asked for for your Ambien CR that you ran out of and until we get the culture back to check on the possibility of MRSA use the immune suppressing cream on the rash.  We should get the culture results in about 2 days.  You can call and check and see what the results are.  You can also check with your doctor who then can give you antibiotics by mouth if need be.

## 2020-10-08 NOTE — ED Notes (Signed)
Pt with sores on face and back, pt states she thinks she has MRSA.

## 2020-10-08 NOTE — ED Notes (Signed)
Pt pacing in room, pt removed all cords and monitors. Pt given verbal reassurance. Pt states she is out of all her meds.

## 2020-10-08 NOTE — ED Triage Notes (Signed)
Pt via POV from home. Pt c/o SOB and generalized CP that has been on going for months but has gotten worse today. Pt also has rash on her face that she believes is MRSA, pt's husband has MRSA and wanted to be tested to make sure. Pt is A&Ox4 and NAD.

## 2020-10-08 NOTE — ED Notes (Signed)
Patient transported to CT 

## 2020-10-10 ENCOUNTER — Telehealth: Payer: Self-pay

## 2020-10-10 NOTE — Telephone Encounter (Signed)
Copied from CRM [id number]. Topic: General - Inquiry >> Oct 10, 2020  4:33 PM Daphine Deutscher D wrote: Reason for CRM: Pt called saying she was in the ER on Sunday and had some test labs.  She has not got the results back yet.   Can the office get those for her.  CB#  [phone]

## 2020-10-11 LAB — AEROBIC CULTURE W GRAM STAIN (SUPERFICIAL SPECIMEN): Gram Stain: NONE SEEN

## 2020-10-11 NOTE — Telephone Encounter (Signed)
Specimen Description SKIN SCRAPING  Performed at [facility], [address] [place], [place] [address]   Special Requests NONE  Performed at [facility], [address] [place], [place] [address]   Gram Stain NO WBC SEEN  NO ORGANISMS SEEN  Performed at [facility], [address], [place], [place] [address]   Culture ABUNDANT METHICILLIN RESISTANT STAPHYLOCOCCUS AUREUS   Report Status 10/11/2020 FINAL   Organism ID, Bacteria METHICILLIN RESISTANT STAPHYLOCOCCUS AUREUS   Resulting Agency [facility]      Susceptibility   Methicillin resistant staphylococcus aureus    MIC    CIPROFLOXACIN >=8 RESISTANT  Resistant    CLINDAMYCIN <=0.25 SENS... Sensitive    ERYTHROMYCIN >=8 RESISTANT  Resistant    GENTAMICIN <=0.5 SENSI... Sensitive    Inducible Clindamycin NEGATIVE  Sensitive    OXACILLIN >=4 RESISTANT  Resistant    RIFAMPIN <=0.5 SENSI... Sensitive    TETRACYCLINE <=1 SENSITIVE  Sensitive    TRIMETH/SULFA 20 SENSITIVE  Sensitive    VANCOMYCIN 1 SENSITIVE  Sensitive            Susceptibility Comments  Methicillin resistant staphylococcus aureus  ABUNDANT METHICILLIN RESISTANT STAPHYLOCOCCUS AUREUS          Please advise on results for skin culture

## 2020-10-11 NOTE — Telephone Encounter (Signed)
Left message to return call.  Please schedule Patient for hospital follow up when calling back in. Will review labs at her appointment.

## 2020-10-11 NOTE — Telephone Encounter (Signed)
Pt returned your call and states that her phone is messing up and she will try you back in a hour. She states that she really needs to speak with you about her labs

## 2020-10-11 NOTE — Telephone Encounter (Signed)
Patient calling back in and states she has been back and forth with the hospital all day for her skin culture results. Patient was seen at [facility] ED on 10/08/20, test was resulted 10/11/20 morning.   Informed the Patient and her husband that I am unsure why the hospital informed them to call her PCP. Informed her that normally we are not allowed to result the labs of another doctor. That since Dr French Ana McLean-Scocuzza did not evaluate her or order the labs she can not be the one to result them. Patient's husband asked me to tell them the results. Informed them that a doctor has to read the results. Informed them that the results that are in are from the lab, there is currently no comment from a doctor explaining what the labs mean or what treatment is needed.   Patient and her husband began to yell and curse. Informed them that if they continued to speak this way the call will need to be disconnected. Informed them that I apologize for the back and forth. Informed them that a possible skin infection should not be overlooked so I will send the results to Dr French Ana McLean-Scocuzza to see if she can result them and treat the Patient. Informed them that someone would call them back today. They verbalized understanding.   Patient's husband got on the phone and asked if a nerve medication can be sent in for the Patient. Informed him that Patient was referred to Psychiatry. He asked if a temporary supply could be sent in. I Informed him Dr French Ana McLean-Scocuzza would no longer fill this medication for her as their controlled substance contract was broken. Gave Patient and husband information for [facility].

## 2020-10-11 NOTE — Telephone Encounter (Signed)
Please sch appt to discuss in person

## 2020-10-11 NOTE — Telephone Encounter (Signed)
Please advise on labs done 10/08/20

## 2020-10-12 NOTE — Progress Notes (Signed)
Brief Pharmacy Note  Pharmacist reviewed ED Culture Report. Patient presented with chest pain and shortness of breath. Also stated that she had a rash to her face and upper back and that her husband had a similar rash with MRSA. Per the EDP note, the rash was "superficial round abrasion looking areas, some of which are scabbed. None larger than dime size." Patient was also treated for edema.  A culture of the rash was taken, which appears to be a superficial swab rather than any purulent material or drainage. Discussed culture result with EDP Bradler. Patient was discharged with Bactroban topical BID which based on the above description of the rash should be sufficient.   Patient and husband have made multiple calls to her PCP regarding culture results. Per PCP office notes, no recommendations since patient was not seen by them. Since patient is requesting antibiotic, will give Bactrim 1 DS tablet BID x 5 days per discussion with EDP.  I called the patient and discussed above recommendation and that the Bactroban cream she was discharged with would cover MRSA superficial infection. I relayed plan for Bactrim as above and confirmed pharmacy. Prescription called into [facility] on [address]. I informed patient that if she has any further concerns, she would need to follow up with her PCP.  Laureen Ochs, PharmD

## 2020-10-12 NOTE — Telephone Encounter (Signed)
Still no antibiotics sent in for patient who keeps calling the office for treatment received in the ED 10/08/20 with abnormal culture and needs antibiotics ASAP

## 2020-11-20 ENCOUNTER — Telehealth: Payer: Self-pay | Admitting: Internal Medicine

## 2020-11-20 NOTE — Telephone Encounter (Signed)
Patient calling in for refills of Effexor. Informed that this was sent in 04/2020 90 capsules with 3 refills.   Patient verbalized understanding

## 2020-11-21 ENCOUNTER — Telehealth: Payer: Self-pay

## 2020-11-21 NOTE — Telephone Encounter (Signed)
Pt needs a refill on zolpidem (AMBIEN CR) 6.25 MG CR tablet and would like it today because she has other medication to be picked up from [facility] on [address].

## 2020-11-22 NOTE — Telephone Encounter (Signed)
Per review of chart, Dr French Ana refilled in January with notation that further refills to be done through psychiatry.  Also see phone note 08/31/20.

## 2020-11-22 NOTE — Telephone Encounter (Signed)
Called patient, phone continued to ring and did not connect. Could not leave a message to call back.

## 2020-11-24 NOTE — Telephone Encounter (Signed)
Called patient. Could not leave a voicemail to call back.

## 2020-11-28 ENCOUNTER — Ambulatory Visit: Payer: Self-pay | Admitting: Family

## 2020-11-30 NOTE — Telephone Encounter (Signed)
Called patient. Could not leave a voicemail to call back. 3rd attempt to reach patient. Mychart message has been sent asking the patient to call our office to discuss medication.

## 2021-01-17 ENCOUNTER — Ambulatory Visit: Payer: Self-pay | Admitting: Internal Medicine

## 2021-02-13 ENCOUNTER — Telehealth: Payer: Self-pay | Admitting: Internal Medicine

## 2021-02-13 NOTE — Telephone Encounter (Signed)
Pt called in requesting refill on medication (zolpidem (AMBIEN CR) 6.25 MG CR tablet). Pt stated that she doesn't have any more and she needs medication to sleep.

## 2021-02-14 NOTE — Telephone Encounter (Signed)
See 05/01/20 telephone encounter for complete documentation. Partial Documentation below: "McLean-Scocuzza, Pasty Spillers, MD  Tilford Pillar, CMA Cc: Youngblood, Rasheedah R UDS + alcohol and benzos though I have not prescribed these abnormal and will need to f/u psychiatry for anxiety ASAP"  Dr French Ana McLean-Scocuzza will no longer be prescribing this medication for the Patient. Attempted to contact Patient multiple times to inform at time of 05/01/20 telephone encounter. Patient's mother also called in and informed previously.   Patient will have to get medication from specialty.

## 2021-02-14 NOTE — Telephone Encounter (Signed)
No answer, no voicemail when calling Patient this morning.

## 2021-02-16 NOTE — Telephone Encounter (Signed)
No answer, no voicemail.  Mychart message sent.  

## 2021-03-07 NOTE — Congregational Nurse Program (Signed)
  Dept: [phone]   Congregational Nurse Program Note  Date of Encounter: 03/07/2021  Past Medical History: Past Medical History:  Diagnosis Date   Anemia    with PICA sx's    Anxiety    Bacterial vaginosis    CHF (congestive heart failure) (HCC)    Chicken pox    Depression    HSV (herpes simplex virus) infection    Hypertension    Vitamin D deficiency     Encounter Details:  CNP Questionnaire - 03/07/21 1435       Questionnaire   Do you give verbal consent to treat you today? Yes    Location Patient Served  Not Applicable    Visit Setting Church or Organization    Patient Status Homeless   sleeping at friend's as doesn't have a home   Insurance Uninsured ([employer]/Care Connects/Self-Pay)    Production designer, theatre/television/film Assistance    Medication Referred to Medication Assistance    Medical Provider Yes    Screening Referrals N/A    Medical Referral Non-[facility] PCP/Clinic    Medical Appointment Made N/A    Food Have Food Insecurities    Transportation Need transportation assistance   catches rides with friends   Housing/Utilities No permanent housing    Interpersonal Safety N/A    Intervention Blood pressure;Support;Counsel    ED Visit Averted N/A    Life-Saving Intervention Made N/A           Client into nurse only clinic requesting assistance with obtaining meds. States she lost job and had a really terrible year leaving her essentially homeless. She stays at a friend's for now and gets ride with friend. Will have drug test tomorrow for a possible job at [employer] in [place]. If she gets job, it will still be a few months before insurance starts. Has PCP appointment 12/21 and knows she will get prescriptions to restart and wont be able to pay for them. Cannot stay today to complete application. Took with her to complete and return directly to med assist clinic or to this nurse 12/12. Requests BP 152/94. Counseled re: results. Recheck next week. Rhermann,  RN

## 2021-03-12 NOTE — Congregational Nurse Program (Signed)
  Dept: [phone]   Congregational Nurse Program Note  Date of Encounter: 03/12/2021  Past Medical History: Past Medical History:  Diagnosis Date   Anemia    with PICA sx's    Anxiety    Bacterial vaginosis    CHF (congestive heart failure) (HCC)    Chicken pox    Depression    HSV (herpes simplex virus) infection    Hypertension    Vitamin D deficiency     Encounter Details:  CNP Questionnaire - 03/12/21 1245       Questionnaire   Do you give verbal consent to treat you today? Yes    Location Patient Served  Not Applicable    Visit Setting Church or Organization    Patient Status Homeless    Insurance Uninsured ([employer] Connects/Self-Pay)    Insurance Referral N/A    Medication Referred to Medication Assistance;Have Medication Insecurities    Medical Provider Yes    Screening Referrals N/A    Medical Referral N/A    Medical Appointment Made N/A    Food N/A    Transportation N/A    Housing/Utilities No permanent housing    Interpersonal Safety N/A    Intervention Advocate;Support    ED Visit Averted N/A    Life-Saving Intervention Made N/A            Client into nurse only clinic for assistance to access medication clinic with [facility]. Assisted with form completion and delivered form on behalf of client.. phone number is [phone]. RTC as needed. Rhermann, RN

## 2021-03-21 ENCOUNTER — Ambulatory Visit: Payer: Self-pay | Admitting: Internal Medicine

## 2021-03-21 ENCOUNTER — Telehealth: Payer: Self-pay | Admitting: Internal Medicine

## 2021-03-21 NOTE — Telephone Encounter (Signed)
For your information  

## 2021-03-21 NOTE — Telephone Encounter (Signed)
Patient called in cancel appt going to hospital [facility] in [place] because of fluid in stomach and ankles or red , dry cough , no fever and have not able to sleep

## 2021-04-07 ENCOUNTER — Encounter: Payer: Self-pay | Admitting: Intensive Care

## 2021-04-07 ENCOUNTER — Emergency Department: Payer: Medicaid Other

## 2021-04-07 ENCOUNTER — Other Ambulatory Visit: Payer: Self-pay

## 2021-04-07 ENCOUNTER — Inpatient Hospital Stay
Admission: EM | Admit: 2021-04-07 | Discharge: 2021-04-11 | DRG: 441 | Disposition: A | Discharge status: Home / Self Care | Payer: Medicaid Other | Attending: Internal Medicine | Admitting: Internal Medicine

## 2021-04-07 DIAGNOSIS — I5032 Chronic diastolic (congestive) heart failure: Secondary | ICD-10-CM | POA: Diagnosis present

## 2021-04-07 DIAGNOSIS — I10 Essential (primary) hypertension: Secondary | ICD-10-CM | POA: Diagnosis present

## 2021-04-07 DIAGNOSIS — K769 Liver disease, unspecified: Secondary | ICD-10-CM

## 2021-04-07 DIAGNOSIS — F17210 Nicotine dependence, cigarettes, uncomplicated: Secondary | ICD-10-CM | POA: Diagnosis present

## 2021-04-07 DIAGNOSIS — L03211 Cellulitis of face: Secondary | ICD-10-CM

## 2021-04-07 DIAGNOSIS — I110 Hypertensive heart disease with heart failure: Secondary | ICD-10-CM | POA: Diagnosis present

## 2021-04-07 DIAGNOSIS — Z880 Allergy status to penicillin: Secondary | ICD-10-CM | POA: Diagnosis not present

## 2021-04-07 DIAGNOSIS — E559 Vitamin D deficiency, unspecified: Secondary | ICD-10-CM | POA: Diagnosis present

## 2021-04-07 DIAGNOSIS — F32A Depression, unspecified: Secondary | ICD-10-CM | POA: Diagnosis present

## 2021-04-07 DIAGNOSIS — Z20822 Contact with and (suspected) exposure to covid-19: Secondary | ICD-10-CM | POA: Diagnosis present

## 2021-04-07 DIAGNOSIS — D500 Iron deficiency anemia secondary to blood loss (chronic): Secondary | ICD-10-CM | POA: Diagnosis not present

## 2021-04-07 DIAGNOSIS — Z885 Allergy status to narcotic agent status: Secondary | ICD-10-CM

## 2021-04-07 DIAGNOSIS — Z9049 Acquired absence of other specified parts of digestive tract: Secondary | ICD-10-CM

## 2021-04-07 DIAGNOSIS — E876 Hypokalemia: Secondary | ICD-10-CM | POA: Diagnosis not present

## 2021-04-07 DIAGNOSIS — Z8249 Family history of ischemic heart disease and other diseases of the circulatory system: Secondary | ICD-10-CM

## 2021-04-07 DIAGNOSIS — K3189 Other diseases of stomach and duodenum: Secondary | ICD-10-CM | POA: Diagnosis present

## 2021-04-07 DIAGNOSIS — I8500 Esophageal varices without bleeding: Secondary | ICD-10-CM

## 2021-04-07 DIAGNOSIS — Z79899 Other long term (current) drug therapy: Secondary | ICD-10-CM | POA: Diagnosis not present

## 2021-04-07 DIAGNOSIS — L409 Psoriasis, unspecified: Secondary | ICD-10-CM | POA: Diagnosis present

## 2021-04-07 DIAGNOSIS — K766 Portal hypertension: Secondary | ICD-10-CM | POA: Diagnosis not present

## 2021-04-07 DIAGNOSIS — G4700 Insomnia, unspecified: Secondary | ICD-10-CM | POA: Diagnosis present

## 2021-04-07 DIAGNOSIS — E871 Hypo-osmolality and hyponatremia: Secondary | ICD-10-CM | POA: Diagnosis present

## 2021-04-07 DIAGNOSIS — F419 Anxiety disorder, unspecified: Secondary | ICD-10-CM | POA: Diagnosis present

## 2021-04-07 DIAGNOSIS — D62 Acute posthemorrhagic anemia: Secondary | ICD-10-CM | POA: Diagnosis not present

## 2021-04-07 DIAGNOSIS — K922 Gastrointestinal hemorrhage, unspecified: Secondary | ICD-10-CM | POA: Diagnosis present

## 2021-04-07 DIAGNOSIS — I8511 Secondary esophageal varices with bleeding: Secondary | ICD-10-CM | POA: Diagnosis present

## 2021-04-07 DIAGNOSIS — K7460 Unspecified cirrhosis of liver: Secondary | ICD-10-CM | POA: Diagnosis present

## 2021-04-07 LAB — COMPREHENSIVE METABOLIC PANEL
ALT: 17 U/L (ref 0–44)
AST: 29 U/L (ref 15–41)
Albumin: 3.3 g/dL — ABNORMAL LOW (ref 3.5–5.0)
Alkaline Phosphatase: 140 U/L — ABNORMAL HIGH (ref 38–126)
Anion gap: 12 (ref 5–15)
BUN: 35 mg/dL — ABNORMAL HIGH (ref 6–20)
CO2: 22 mmol/L (ref 22–32)
Calcium: 9 mg/dL (ref 8.9–10.3)
Chloride: 97 mmol/L — ABNORMAL LOW (ref 98–111)
Creatinine, Ser: 0.82 mg/dL (ref 0.44–1.00)
GFR, Estimated: >60 mL/min (ref >60)
Glucose, Bld: 116 mg/dL — ABNORMAL HIGH (ref 70–99)
Potassium: 3.5 mmol/L (ref 3.5–5.1)
Sodium: 131 mmol/L — ABNORMAL LOW (ref 135–145)
Total Bilirubin: 1.2 mg/dL (ref 0.3–1.2)
Total Protein: 7.4 g/dL (ref 6.5–8.1)

## 2021-04-07 LAB — CBC
HCT: 25.8 % — ABNORMAL LOW (ref 36.0–46.0)
Hemoglobin: 8.7 g/dL — ABNORMAL LOW (ref 12.0–15.0)
MCH: 30.5 pg (ref 26.0–34.0)
MCHC: 33.7 g/dL (ref 30.0–36.0)
MCV: 90.5 fL (ref 80.0–100.0)
Platelets: 243 10*3/uL (ref 150–400)
RBC: 2.85 MIL/uL — ABNORMAL LOW (ref 3.87–5.11)
RDW: 15.1 % (ref 11.5–15.5)
WBC: 8.7 10*3/uL (ref 4.0–10.5)
nRBC: 0 % (ref 0.0–0.2)

## 2021-04-07 LAB — PROTIME-INR
INR: 1.2 (ref 0.8–1.2)
Prothrombin Time: 15.5 seconds — ABNORMAL HIGH (ref 11.4–15.2)

## 2021-04-07 LAB — LIPASE, BLOOD: Lipase: 27 U/L (ref 11–51)

## 2021-04-07 LAB — RESP PANEL BY RT-PCR (FLU A&B, COVID) ARPGX2
Influenza A by PCR: NEGATIVE
Influenza B by PCR: NEGATIVE
SARS Coronavirus 2 by RT PCR: NEGATIVE

## 2021-04-07 LAB — TROPONIN I (HIGH SENSITIVITY): Troponin I (High Sensitivity): 10 ng/L (ref <18)

## 2021-04-07 LAB — ETHANOL: Alcohol, Ethyl (B): <10 mg/dL (ref <10)

## 2021-04-07 MED ORDER — SODIUM CHLORIDE 0.9 % IV SOLN: Freq: Once | INTRAVENOUS | Status: AC

## 2021-04-07 MED ORDER — ONDANSETRON 4 MG PO TBDP
ORAL_TABLET | ORAL | Status: AC
  Filled 2021-04-07: qty 1

## 2021-04-07 MED ORDER — ONDANSETRON 4 MG PO TBDP
4.0000 mg | ORAL_TABLET | Freq: Once | ORAL | Status: AC | PRN
  Administered 2021-04-07: 4 mg via ORAL

## 2021-04-07 MED ORDER — ALPRAZOLAM 0.5 MG PO TABS
0.5000 mg | ORAL_TABLET | Freq: Three times a day (TID) | ORAL | Status: DC | PRN
  Administered 2021-04-08 – 2021-04-10 (×7): 0.5 mg via ORAL
  Filled 2021-04-07 (×9): qty 1

## 2021-04-07 MED ORDER — METHOCARBAMOL 500 MG PO TABS
500.0000 mg | ORAL_TABLET | Freq: Four times a day (QID) | ORAL | Status: DC | PRN
  Filled 2021-04-07: qty 1

## 2021-04-07 MED ORDER — PANTOPRAZOLE INFUSION (NEW) - SIMPLE MED
8.0000 mg/h | INTRAVENOUS | Status: DC
  Administered 2021-04-07 – 2021-04-08 (×3): 8 mg/h via INTRAVENOUS
  Filled 2021-04-07 (×2): qty 100
  Filled 2021-04-07: qty 80

## 2021-04-07 MED ORDER — SODIUM CHLORIDE 0.9 % IV BOLUS
1000.0000 mL | Freq: Once | INTRAVENOUS | Status: AC
Start: 2021-04-07 — End: 2021-04-07
  Administered 2021-04-07: 1000 mL via INTRAVENOUS

## 2021-04-07 MED ORDER — ACETAMINOPHEN 500 MG PO TABS
1000.0000 mg | ORAL_TABLET | Freq: Four times a day (QID) | ORAL | Status: DC | PRN
  Administered 2021-04-08: 1000 mg via ORAL
  Filled 2021-04-07 (×2): qty 2

## 2021-04-07 MED ORDER — PANTOPRAZOLE INFUSION (NEW) - SIMPLE MED
80.0000 mg | Freq: Once | INTRAVENOUS | Status: AC
  Administered 2021-04-07: 80 mg via INTRAVENOUS
  Filled 2021-04-07: qty 100

## 2021-04-07 MED ORDER — DOXYCYCLINE HYCLATE 100 MG PO TABS
100.0000 mg | ORAL_TABLET | Freq: Two times a day (BID) | ORAL | Status: DC
  Administered 2021-04-08: 100 mg via ORAL
  Filled 2021-04-07 (×2): qty 1

## 2021-04-07 MED ORDER — HYDROCORTISONE 1 % EX CREA
TOPICAL_CREAM | Freq: Three times a day (TID) | CUTANEOUS | Status: DC
  Filled 2021-04-07 (×4): qty 28

## 2021-04-07 MED ORDER — LORAZEPAM 2 MG/ML IJ SOLN
1.0000 mg | Freq: Once | INTRAMUSCULAR | Status: AC
  Administered 2021-04-07: 1 mg via INTRAVENOUS
  Filled 2021-04-07: qty 1

## 2021-04-07 NOTE — ED Notes (Addendum)
New shift assessment: Pt denies abdominal pain. No vomiting, diarrhea or melena. Reports last vomiting episode prior to bringing back to ER bed. Requests pain medication for back pain. Also requests medication for itching for forehead and asked if abx had been prescribed for head because doctor had mentioned it. Provided patient with blanket and pillow. Denies other needs at this time.

## 2021-04-07 NOTE — ED Triage Notes (Signed)
Patient having coffee ground/bloody emesis and black stool that started last night. Reports symptoms of N/V and abdominal pain the last week. Patient appears pale and sweaty. Episode of coffee ground emesis in lobby bathroom

## 2021-04-07 NOTE — H&P (Signed)
[facility] History and Physical  Shelly Stevenson [id number] DOB: [birth year] DOA: 04/07/2021  Referring physician: Dr. Ellender Hose PCP: McLean-Scocuzza, Nino Glow, MD   Chief Complaint: vomiting blood  HPI: Shelly Stevenson is a 56 y.o. female with hx of Zaidi and depression, psoriasis, hypertension, who presents after an episode of vomiting blood.  Patient reports that prior to presentation to the hospital she had an episode where she vomited which she estimates to be a gallon of dark brown bloody material.  She denies having frequent nausea vomiting prior to this but does endorse being under a lot of stress which she thinks is the cause.  She denies use of aspirin, regular use of NSAIDs, alcohol.  She denies any dark or tarry stools to me (though she did endorse this to the ED provider).  She reports she has had a normal colonoscopy in the recent past.  She also reports having numerous skin infections, and is worried about 2 on her face that she fears may need to have incision and drainage.  She reports this is present because of anxiety and frequent skin picking.  In the ED vital signs notable only for mild tachycardia.  Lab work-up most notable for CBC showing drop in hemoglobin from 12.7-8.7 compared to most recent check from 6 months ago.  Remainder of lab work-up including CMP, troponin, lipase, coags was all unremarkable.  Chest x-ray and two-view abdominal plain film were both unremarkable.  She was started on IV fluids, PPI drip, and given Ativan for anxiety.  Gastroenterology was consulted and recommended EGD in the a.m. for suspected upper GI bleed.  Review of Systems:  Pertinent positives and negative per HPI, all others reviewed and negative  Past Medical History:  Diagnosis Date   Anemia    with PICA sx's    Anxiety    Bacterial vaginosis    CHF (congestive heart failure) (HCC)    Chicken pox    Depression    HSV (herpes simplex virus) infection    Hypertension    Vitamin D  deficiency    Past Surgical History:  Procedure Laterality Date   APPENDECTOMY     56 y.o.    AUGMENTATION MAMMAPLASTY Bilateral    COLONOSCOPY WITH PROPOFOL N/A 05/02/2017   Procedure: COLONOSCOPY WITH PROPOFOL;  Surgeon: Virgel Manifold, MD;  Location: [facility] ENDOSCOPY;  Service: Endoscopy;  Laterality: N/A;   FRACTURE SURGERY     Left thumb   Social History:  reports that she has been smoking cigarettes. She has been smoking an average of .5 packs per day. She has never used smokeless tobacco. She reports current alcohol use. She reports that she does not currently use drugs.  Allergies  Allergen Reactions   Penicillins Swelling    Angioedema   Codeine Rash    Elevated heartrate    Family History  Problem Relation Age of Onset   Arthritis Mother    Cancer Mother        breast   Hearing loss Mother    Hypertension Mother    Breast cancer Mother 36   Arthritis Father    Cancer Father        colon dx'ed age 5s    Hypertension Father    Heart disease Father        MI   Diabetes Father    Mental illness Son        [facility]     Prior to Admission medications   Medication Sig Start Date End  Date Taking? Authorizing Provider  amLODipine (NORVASC) 2.5 MG tablet Take 1 tablet (2.5 mg total) by mouth daily. 04/28/20   McLean-Scocuzza, Nino Glow, MD  ferrous sulfate 324 (65 Fe) MG TBEC Take 1 tablet (325 mg total) by mouth daily. With meals 04/27/20   McLean-Scocuzza, Nino Glow, MD  furosemide (LASIX) 40 MG tablet Take 1 tablet (40 mg total) by mouth daily. 10/08/20 10/08/21  Dajanae Polio, MD  potassium chloride SA (KLOR-CON) 20 MEQ tablet Take 1 tablet (20 mEq total) by mouth 2 (two) times daily. 04/28/20   McLean-Scocuzza, Nino Glow, MD  venlafaxine XR (EFFEXOR-XR) 150 MG 24 hr capsule Take 1 capsule (150 mg total) by mouth daily. 04/28/20   McLean-Scocuzza, Nino Glow, MD  zolpidem (AMBIEN CR) 6.25 MG CR tablet Take 1 tablet (6.25 mg total) by mouth at bedtime as needed for sleep. 10/08/20    Beadie Polio, MD   Physical Exam: Vitals:   04/07/21 0916 04/07/21 1251 04/07/21 1534  BP: 113/75 115/71 117/70  Pulse: (!) 106 (!) 105 100  Resp: 16 17 17   Temp: 98 F (36.7 C) 98.8 F (37.1 C) 98.4 F (36.9 C)  TempSrc: Oral Oral Oral  SpO2: 96% 98% 98%  Weight: 65.8 kg    Height: 5\' 5"  (1.651 m)      Wt Readings from Last 3 Encounters:  04/07/21 65.8 kg  10/08/20 65.8 kg  04/28/20 78.9 kg     General:  Appears calm and comfortable Eyes: PERRL, normal lids, irises & conjunctiva ENT: grossly normal hearing, lips & tongue Neck: no masses Cardiovascular: RRR, no m/r/g. No LE edema. Respiratory: CTA bilaterally, no w/r/r. Normal respiratory effort. Abdomen: soft, ntnd Skin: psoriatic rash on numerous areas, particularly elbows and parts of face. Area of erythema and swelling along L scalp/hairline with scab present Musculoskeletal: grossly normal tone BUE/BLE Psychiatric: anxious mood and affect, speech fluent and appropriate Neurologic: grossly non-focal.          Labs on Admission:  Basic Metabolic Panel: Recent Labs  Lab 04/07/21 0918  NA 131*  K 3.5  CL 97*  CO2 22  GLUCOSE 116*  BUN 35*  CREATININE 0.82  CALCIUM 9.0   Liver Function Tests: Recent Labs  Lab 04/07/21 0918  AST 29  ALT 17  ALKPHOS 140*  BILITOT 1.2  PROT 7.4  ALBUMIN 3.3*   Recent Labs  Lab 04/07/21 0918  LIPASE 27   No results for input(s): AMMONIA in the last 168 hours. CBC: Recent Labs  Lab 04/07/21 0918  WBC 8.7  HGB 8.7*  HCT 25.8*  MCV 90.5  PLT 243   Cardiac Enzymes: No results for input(s): CKTOTAL, CKMB, CKMBINDEX, TROPONINI in the last 168 hours.  BNP (last 3 results) Recent Labs    04/24/20 1030 10/08/20 1341  BNP 45.5 48.9    ProBNP (last 3 results) No results for input(s): PROBNP in the last 8760 hours.  CBG: No results for input(s): GLUCAP in the last 168 hours.  Radiological Exams on Admission: DG Abdomen Acute W/Chest  Result  Date: 04/07/2021 CLINICAL DATA:  Abdominal pain.  Coffee-ground emesis. EXAM: DG ABDOMEN ACUTE WITH 1 VIEW CHEST COMPARISON:  Chest radiograph 10/08/2020 FINDINGS: Embolization coils noted within the left upper quadrant of the abdomen. There is no evidence of dilated bowel loops or free intraperitoneal air. No radiopaque calculi or other significant radiographic abnormality is seen. Heart size and mediastinal contours are within normal limits. Both lungs are clear. IMPRESSION: Negative abdominal radiographs.  No acute cardiopulmonary disease. Electronically Signed   By: Kerby Moors M.D.   On: 04/07/2021 13:59    EKG: Independently reviewed.  Mild sinus tachycardia, unusual appearance of T waves in V5 but no other signs of ischemic changes or T wave abnormalities in any of the other inferior lateral leads.  Overall no acute ischemic changes.  Compared to prior no significant change.  Assessment/Plan Principal Problem:   Upper GI bleed Active Problems:   Anxiety and depression   Psoriasis   Essential hypertension   Shelly Stevenson is a 56 y.o. female with hx of Zaidi and depression, psoriasis, hypertension, who presents after an episode of vomiting blood and is admitted with suspected UGIB with plan for EGD in AM.   #Suspected Upper GI Bleed Patient reports episode of possible hematemesis versus coffee-ground emesis.  Evaluated by GI with plan for EGD in a.m. - N.p.o. - IV PPI - Continue active type and screen - Maintain 2 large-bore IVs - CT angiogram if has further episodes of bleeding - Trend CBC every 6 hours - Appreciate GI consultation  #Cellulitis Appears to have mild cellulitis on her forehead, though appearance also somewhat consistent with psoriasis.  No other systemic signs of infection, will treat with antibiotics. - Doxycycline 100 mg every 12 hours  #Chronic medical problems Hypertension-hold amlodipine  Anxiety/depression-hold venlafaxine  Insomnia-continue Ambien  Code  Status: Full Code, confirmed DVT Prophylaxis: SCDs Family Communication: none Disposition Plan: Inpatient, Med-Surg   Time spent: 69 min  Clarnce Flock MD/MP[facility]  Note:  This document was prepared using Systems analyst and may include unintentional dictation errors.

## 2021-04-07 NOTE — ED Triage Notes (Signed)
Spoke with [facility] in Lab, requested assistance with lab collection for Type and Screen

## 2021-04-07 NOTE — Consult Note (Signed)
Shelly Stevenson , MD [address], [place], [place], [address] [address], [address], [place], [place], [address] Phone: [phone]  Fax: [phone]  Consultation  Referring Provider:     Dr Erma Heritage Primary Care Physician:  McLean-Scocuzza, Pasty Spillers, MD Primary Gastroenterologist:  None         Reason for Consultation:     GI bleed  Date of Admission:  04/07/2021 Date of Consultation:  04/07/2021         HPI:   Shelly Stevenson is a 56 y.o. female Presented to the emergency room with coffee-ground emesis and melena that began last night.  She states that for the past few days she has been having nausea and a few days back she had a fever.  Denies any abdominal pain whatsoever.  All of a sudden last night started throwing up blood had about 5-6 episodes of bloody emesis.  Associated with black tarry stools.  Since coming to the hospital has been feeling better.  She had 1 episode of coffee-ground emesis earlier on.  She says the quantity has been reducing.  Denies any NSAID use.  Denies any excess alcohol consumption.  Denies any similar episodes in the past.  She states that she is very anxious at this point of time. On admission hemoglobin 8.7 g was 12.7 g 6 months back, INR 1.2 BUN 35 creatinine 0.82 .  X-ray abdomen not yet reported but no evidence of perforation.  Past Medical History:  Diagnosis Date   Anemia    with PICA sx's    Anxiety    Bacterial vaginosis    CHF (congestive heart failure) (HCC)    Chicken pox    Depression    HSV (herpes simplex virus) infection    Hypertension    Vitamin D deficiency     Past Surgical History:  Procedure Laterality Date   APPENDECTOMY     56 y.o.    AUGMENTATION MAMMAPLASTY Bilateral    COLONOSCOPY WITH PROPOFOL N/A 05/02/2017   Procedure: COLONOSCOPY WITH PROPOFOL;  Surgeon: Pasty Spillers, MD;  Location: [facility] ENDOSCOPY;  Service: Endoscopy;  Laterality: N/A;   FRACTURE SURGERY     Left thumb    Prior to Admission  medications   Medication Sig Start Date End Date Taking? Authorizing Provider  amLODipine (NORVASC) 2.5 MG tablet Take 1 tablet (2.5 mg total) by mouth daily. 04/28/20   McLean-Scocuzza, Pasty Spillers, MD  ciprofloxacin (CIPRO) 500 MG tablet Take 1 tablet (500 mg total) by mouth 2 (two) times daily. With food 04/28/20   McLean-Scocuzza, Pasty Spillers, MD  clobetasol cream (TEMOVATE) 0.05 % Apply 1 application topically 2 (two) times daily. Hands elbows not face/groin 04/28/20   McLean-Scocuzza, Pasty Spillers, MD  ferrous sulfate 324 (65 Fe) MG TBEC Take 1 tablet (325 mg total) by mouth daily. With meals Patient not taking: Reported on 04/28/2020 04/27/20   McLean-Scocuzza, Pasty Spillers, MD  fluticasone [facility]) 50 MCG/ACT nasal spray Place 2 sprays into both nostrils daily as needed for allergies or rhinitis. Patient not taking: Reported on 04/28/2020 04/21/20   McLean-Scocuzza, Pasty Spillers, MD  furosemide (LASIX) 40 MG tablet Take 1 tablet (40 mg total) by mouth daily. In am and 20 mg for lunch x 2 weeks. After 2 weeks if better go back to 40 mg daily 04/28/20 04/28/21  McLean-Scocuzza, Pasty Spillers, MD  furosemide (LASIX) 40 MG tablet Take 1 tablet (40 mg total) by mouth daily. 10/08/20 10/08/21  Shelly Stevenson  F, MD  mupirocin cream (BACTROBAN) 2 % Apply 1 application topically 2 (two) times daily. 10/08/20   Shelly Natal, MD  potassium chloride SA (KLOR-CON) 20 MEQ tablet Take 1 tablet (20 mEq total) by mouth 2 (two) times daily. 04/28/20   McLean-Scocuzza, Pasty Spillers, MD  promethazine (PHENERGAN) 12.5 MG tablet Take 1 tablet (12.5 mg total) by mouth every 8 (eight) hours as needed for nausea or vomiting. Patient not taking: Reported on 04/28/2020 04/21/20   McLean-Scocuzza, Pasty Spillers, MD  venlafaxine XR (EFFEXOR-XR) 150 MG 24 hr capsule Take 1 capsule (150 mg total) by mouth daily. 04/28/20   McLean-Scocuzza, Pasty Spillers, MD  zolpidem (AMBIEN CR) 6.25 MG CR tablet Take 1 tablet (6.25 mg total) by mouth at bedtime as needed for sleep. Further  refills psychiatry 04/21/20   McLean-Scocuzza, Pasty Spillers, MD  zolpidem (AMBIEN CR) 6.25 MG CR tablet Take 1 tablet (6.25 mg total) by mouth at bedtime as needed for sleep. 10/08/20   Shelly Natal, MD    Family History  Problem Relation Age of Onset   Arthritis Mother    Cancer Mother        breast   Hearing loss Mother    Hypertension Mother    Breast cancer Mother 62   Arthritis Father    Cancer Father        colon dx'ed age 2s    Hypertension Father    Heart disease Father        MI   Diabetes Father    Mental illness Son        [facility]     Social History   Tobacco Use   Smoking status: Every Day    Packs/day: 0.50    Types: Cigarettes   Smokeless tobacco: Never   Tobacco comments:    <1ppd x 30 years no FH lung cancer   Vaping Use   Vaping Use: Never used  Substance Use Topics   Alcohol use: Yes   Drug use: Not Currently    Allergies as of 04/07/2021 - Review Complete 04/07/2021  Allergen Reaction Noted   Penicillins Swelling 09/28/2016   Codeine Rash 03/24/2017    Review of Systems:    All systems reviewed and negative except where noted in HPI.   Physical Exam:  Vital signs in last 24 hours: Temp:  [98 F (36.7 C)-98.8 F (37.1 C)] 98.8 F (37.1 C) (01/07 1251) Pulse Rate:  [105-106] 105 (01/07 1251) Resp:  [16-17] 17 (01/07 1251) BP: (113-115)/(71-75) 115/71 (01/07 1251) SpO2:  [96 %-98 %] 98 % (01/07 1251) Weight:  [65.8 kg] 65.8 kg (01/07 0916)   General:   Pleasant, cooperative in NAD Head:  Normocephalic and atraumatic. Eyes:   No icterus.   Conjunctiva pink. PERRLA. Ears:  Normal auditory acuity. Neck:  Supple; no masses or thyroidomegaly Lungs: Respirations even and unlabored. Lungs clear to auscultation bilaterally.   No wheezes, crackles, or rhonchi.  Heart:  Regular rate and rhythm;  Without murmur, clicks, rubs or gallops Abdomen:  Soft, nondistended, nontender. Normal bowel sounds. No appreciable masses or hepatomegaly.  No rebound or  guarding.  Neurologic:  Alert and oriented x3;  grossly normal neurologically. Skin: Eczematous rash over her elbows and over her forehead Cervical Nodes:  No significant cervical adenopathy. Psych:  Alert and cooperative. Normal affect.  LAB RESULTS: Recent Labs    04/07/21 0918  WBC 8.7  HGB 8.7*  HCT 25.8*  PLT 243   BMET Recent Labs  04/07/21 0918  NA 131*  K 3.5  CL 97*  CO2 22  GLUCOSE 116*  BUN 35*  CREATININE 0.82  CALCIUM 9.0   LFT Recent Labs    04/07/21 0918  PROT 7.4  ALBUMIN 3.3*  AST 29  ALT 17  ALKPHOS 140*  BILITOT 1.2   PT/INR Recent Labs    04/07/21 0918  LABPROT 15.5*  INR 1.2    STUDIES: No results found.    Impression / Plan:   Marisa Huaena Riera is a 56 y.o. y/o female presents emergency room with coffee-ground emesis and melena.  Drop in hemoglobin from to 8.7 g from a baseline of 12 g.  Elevation in BUN/creatinine ratio.  Likely upper GI bleed.  Differentials include Mallory-Weiss tear.  Plan 1.  IV PPI 2.  Monitor CBC and transfuse as needed.  2 large bore IV cannulas 3.  EGD tomorrow 4.  In the interim she has a significant bleed to consider CT angiogram  I have discussed alternative options, risks & benefits,  which include, but are not limited to, bleeding, infection, perforation,respiratory complication & drug reaction.  The patient agrees with this plan & written consent will be obtained.     Thank you for involving me in the care of this patient.      LOS: 0 days   Shelly MoodKiran Wilferd Ritson, MD  04/07/2021, 1:51 PM

## 2021-04-07 NOTE — ED Provider Notes (Signed)
[facility] Provider Note    Event Date/Time   First MD Initiated Contact with Patient 04/07/21 1235     (approximate)   History   Abdominal Pain and Hematemesis   HPI  Shelly Stevenson is a 56 y.o. female  with no significant PMHx here with abdominal pain, vomiting. Pt reports that over the past 24 hours, she has had profuse nausea, vomiting, and weakness. Report she has been under a significant amount of stress recently, thought this had been what is causing intermittent indigestion/upper abd pain. Over past 24 hr, she began vomiting and states it has had black coffee ground like substance in it, as well as had black, tarry stools. Denies known h/o ulcers. No regular NSAID or EtOH use. No ASA use. She is not on blood thinners. No known h/o liver disease or cirrhosis. Denies any hematemesis or BRBPR.      Physical Exam   Triage Vital Signs: ED Triage Vitals [04/07/21 0916]  Enc Vitals Group     BP 113/75     Pulse Rate (!) 106     Resp 16     Temp 98 F (36.7 C)     Temp Source Oral     SpO2 96 %     Weight 145 lb (65.8 kg)     Height 5\' 5"  (1.651 m)     Head Circumference      Peak Flow      Pain Score 10     Pain Loc      Pain Edu?      Excl. in [place]?     Most recent vital signs: Vitals:   04/07/21 0916 04/07/21 1251  BP: 113/75 115/71  Pulse: (!) 106 (!) 105  Resp: 16 17  Temp: 98 F (36.7 C) 98.8 F (37.1 C)  SpO2: 96% 98%     General: Awake, no distress.  CV:  Good peripheral perfusion. RRR, no murmurs. Resp:  Normal effort. Normal WOB. Clear BS bilaterally. Abd:  No distention. Minimal epigastric TTP without rebound or guarding. No peritoneal signs. Other:  Pulses 2+ and symmetric. Distal perfusion wnl. Tearful, anxious appearing.   ED Results / Procedures / Treatments   Labs (all labs ordered are listed, but only abnormal results are displayed) Labs Reviewed  COMPREHENSIVE METABOLIC PANEL - Abnormal; Notable for the  following components:      Result Value   Sodium 131 (*)    Chloride 97 (*)    Glucose, Bld 116 (*)    BUN 35 (*)    Albumin 3.3 (*)    Alkaline Phosphatase 140 (*)    All other components within normal limits  CBC - Abnormal; Notable for the following components:   RBC 2.85 (*)    Hemoglobin 8.7 (*)    HCT 25.8 (*)    All other components within normal limits  PROTIME-INR - Abnormal; Notable for the following components:   Prothrombin Time 15.5 (*)    All other components within normal limits  POC OCCULT BLOOD, ED  POC URINE PREG, ED  TYPE AND SCREEN  TYPE AND SCREEN     EKG  Sinus tachycardia, VR 104. PR 130, QRS 66, QTc 491. No acute ST elevations or depressions. No ischemia or infarct.   RADIOLOGY AAS: Images reviewed by me, no signs of free air, obstruction, PNA    PROCEDURES:  Critical Care performed: Yes, see critical care procedure note(s)  .Critical Care Performed by: Duffy Bruce, MD  Authorized by: Duffy Bruce, MD   Critical care provider statement:    Critical care time (minutes):  30   Critical care time was exclusive of:  Separately billable procedures and treating other patients   Critical care was necessary to treat or prevent imminent or life-threatening deterioration of the following conditions:  Cardiac failure, circulatory failure and respiratory failure   Critical care was time spent personally by me on the following activities:  Development of treatment plan with patient or surrogate, discussions with consultants, evaluation of patient's response to treatment, examination of patient, ordering and review of laboratory studies, ordering and review of radiographic studies, ordering and performing treatments and interventions, pulse oximetry, re-evaluation of patient's condition and review of old charts .1-3 Lead EKG Interpretation Performed by: Duffy Bruce, MD Authorized by: Duffy Bruce, MD     Interpretation: normal     ECG rate:   90-110   ECG rate assessment: tachycardic     Rhythm: sinus rhythm     Ectopy: none     Conduction: normal   Comments:     Indication: Weakness, GIB    MEDICATIONS ORDERED IN ED: Medications  ondansetron (ZOFRAN-ODT) 4 MG disintegrating tablet (  Not Given 04/07/21 1258)  pantoprozole (PROTONIX) 80 mg /NS 100 mL infusion (has no administration in time range)  pantoprozole (PROTONIX) 80 mg /NS 100 mL infusion (has no administration in time range)  ondansetron (ZOFRAN-ODT) disintegrating tablet 4 mg (4 mg Oral Given 04/07/21 0926)     IMPRESSION / MDM / ASSESSMENT AND PLAN / ED COURSE  I reviewed the triage vital signs and the nursing notes.                               The patient is on the cardiac monitor to evaluate for evidence of arrhythmia and/or significant heart rate changes.   Ddx: PUD, gastritis, AVM, LGIB, coagulopathy, unlikely cholecystitis, pancreatitis.    Plan: Admit for IV protonix, GI consultation, likely EGD in AM. NPO for now.    MEDICATIONS GIVEN IN ED: Medications  ondansetron (ZOFRAN-ODT) 4 MG disintegrating tablet (  Not Given 04/07/21 1258)  pantoprozole (PROTONIX) 80 mg /NS 100 mL infusion (has no administration in time range)  pantoprozole (PROTONIX) 80 mg /NS 100 mL infusion (has no administration in time range)  ondansetron (ZOFRAN-ODT) disintegrating tablet 4 mg (4 mg Oral Given 04/07/21 0926)     ED COURSE: Pt here with coffee-ground emesis and nausea, vomiting. Likely UGIB, possibly stress-related PUD. Denies NSAID, ASA use. Not on anticoagulants. Abdomen is soft, NT, no peritoneal signs to suggest perforation or obstruction. AAS shows no free air or obstruction. Hgb is 8.7, significantly down from baseline of 12. CMP with BUN 35, likely from UGIB. LFTs normal. Renal function normal. Reviewed prior GI notes, has seen Dr. Bonna Gains for colo but no mention of EGD or UGI disease. Case discussed with Dr. Vicente Males, will admit to medicine. Consulted hospitalist  service for admission. IV ativan given for anxiety, IV PPI for UGIB. No known h/o cirrhosis.      Consults: Dr. Vicente Males with GI, Hospitalist   EMR reviewed  Reviewed prior GI notes, has seen Dr. Bonna Gains for colo but no mention of EGD or UGI disease. C    FINAL CLINICAL IMPRESSION(S) / ED DIAGNOSES   Final diagnoses:  None     Rx / DC Orders   ED Discharge Orders     None  Note:  This document was prepared using Dragon voice recognition software and may include unintentional dictation errors.   Duffy Bruce, MD 04/07/21 1400

## 2021-04-08 ENCOUNTER — Encounter
Admission: EM | Disposition: A | Discharge status: Home / Self Care | Payer: Self-pay | Source: Home / Self Care | Attending: Hospitalist

## 2021-04-08 ENCOUNTER — Encounter: Payer: Self-pay | Admitting: Family Medicine

## 2021-04-08 ENCOUNTER — Inpatient Hospital Stay: Payer: Medicaid Other | Admitting: Certified Registered"

## 2021-04-08 ENCOUNTER — Emergency Department: Payer: Medicaid Other

## 2021-04-08 DIAGNOSIS — Z20822 Contact with and (suspected) exposure to covid-19: Secondary | ICD-10-CM | POA: Diagnosis present

## 2021-04-08 DIAGNOSIS — Z9049 Acquired absence of other specified parts of digestive tract: Secondary | ICD-10-CM | POA: Diagnosis not present

## 2021-04-08 DIAGNOSIS — Z885 Allergy status to narcotic agent status: Secondary | ICD-10-CM | POA: Diagnosis not present

## 2021-04-08 DIAGNOSIS — K3189 Other diseases of stomach and duodenum: Secondary | ICD-10-CM | POA: Diagnosis present

## 2021-04-08 DIAGNOSIS — I110 Hypertensive heart disease with heart failure: Secondary | ICD-10-CM | POA: Diagnosis present

## 2021-04-08 DIAGNOSIS — E871 Hypo-osmolality and hyponatremia: Secondary | ICD-10-CM | POA: Diagnosis present

## 2021-04-08 DIAGNOSIS — F17210 Nicotine dependence, cigarettes, uncomplicated: Secondary | ICD-10-CM | POA: Diagnosis present

## 2021-04-08 DIAGNOSIS — G4700 Insomnia, unspecified: Secondary | ICD-10-CM | POA: Diagnosis present

## 2021-04-08 DIAGNOSIS — D500 Iron deficiency anemia secondary to blood loss (chronic): Secondary | ICD-10-CM | POA: Diagnosis not present

## 2021-04-08 DIAGNOSIS — I5032 Chronic diastolic (congestive) heart failure: Secondary | ICD-10-CM | POA: Diagnosis present

## 2021-04-08 DIAGNOSIS — I8511 Secondary esophageal varices with bleeding: Secondary | ICD-10-CM | POA: Diagnosis present

## 2021-04-08 DIAGNOSIS — L409 Psoriasis, unspecified: Secondary | ICD-10-CM | POA: Diagnosis present

## 2021-04-08 DIAGNOSIS — Z880 Allergy status to penicillin: Secondary | ICD-10-CM | POA: Diagnosis not present

## 2021-04-08 DIAGNOSIS — K766 Portal hypertension: Secondary | ICD-10-CM | POA: Diagnosis not present

## 2021-04-08 DIAGNOSIS — F32A Depression, unspecified: Secondary | ICD-10-CM | POA: Diagnosis present

## 2021-04-08 DIAGNOSIS — E559 Vitamin D deficiency, unspecified: Secondary | ICD-10-CM | POA: Diagnosis present

## 2021-04-08 DIAGNOSIS — D62 Acute posthemorrhagic anemia: Secondary | ICD-10-CM | POA: Diagnosis not present

## 2021-04-08 DIAGNOSIS — F419 Anxiety disorder, unspecified: Secondary | ICD-10-CM | POA: Diagnosis present

## 2021-04-08 DIAGNOSIS — K7460 Unspecified cirrhosis of liver: Secondary | ICD-10-CM | POA: Diagnosis present

## 2021-04-08 HISTORY — PX: ESOPHAGOGASTRODUODENOSCOPY (EGD) WITH PROPOFOL: SHX5813

## 2021-04-08 LAB — BASIC METABOLIC PANEL
Anion gap: 6 (ref 5–15)
BUN: 20 mg/dL (ref 6–20)
CO2: 25 mmol/L (ref 22–32)
Calcium: 8.3 mg/dL — ABNORMAL LOW (ref 8.9–10.3)
Chloride: 103 mmol/L (ref 98–111)
Creatinine, Ser: 0.63 mg/dL (ref 0.44–1.00)
GFR, Estimated: >60 mL/min (ref >60)
Glucose, Bld: 92 mg/dL (ref 70–99)
Potassium: 3.5 mmol/L (ref 3.5–5.1)
Sodium: 134 mmol/L — ABNORMAL LOW (ref 135–145)

## 2021-04-08 LAB — CBC
HCT: 21.3 % — ABNORMAL LOW (ref 36.0–46.0)
Hemoglobin: 7 g/dL — ABNORMAL LOW (ref 12.0–15.0)
MCH: 30 pg (ref 26.0–34.0)
MCHC: 32.9 g/dL (ref 30.0–36.0)
MCV: 91.4 fL (ref 80.0–100.0)
Platelets: 191 10*3/uL (ref 150–400)
RBC: 2.33 MIL/uL — ABNORMAL LOW (ref 3.87–5.11)
RDW: 15.8 % — ABNORMAL HIGH (ref 11.5–15.5)
WBC: 4.7 10*3/uL (ref 4.0–10.5)
nRBC: 0 % (ref 0.0–0.2)

## 2021-04-08 LAB — PREPARE RBC (CROSSMATCH)

## 2021-04-08 LAB — ABO/RH: ABO/RH(D): O POS

## 2021-04-08 SURGERY — ESOPHAGOGASTRODUODENOSCOPY (EGD) WITH PROPOFOL
Anesthesia: Monitor Anesthesia Care

## 2021-04-08 MED ORDER — SODIUM CHLORIDE 0.9 % IV SOLN: INTRAVENOUS | Status: DC

## 2021-04-08 MED ORDER — SODIUM CHLORIDE 0.9% IV SOLUTION: Freq: Once | INTRAVENOUS | Status: DC

## 2021-04-08 MED ORDER — PROPOFOL 10 MG/ML IV BOLUS
INTRAVENOUS | Status: AC
  Filled 2021-04-08: qty 20

## 2021-04-08 MED ORDER — OCTREOTIDE LOAD VIA INFUSION
50.0000 ug | Freq: Once | INTRAVENOUS | Status: DC
  Filled 2021-04-08: qty 25

## 2021-04-08 MED ORDER — VENLAFAXINE HCL ER 75 MG PO CP24
150.0000 mg | ORAL_CAPSULE | Freq: Every day | ORAL | Status: DC
  Administered 2021-04-08 – 2021-04-11 (×4): 150 mg via ORAL
  Filled 2021-04-08 (×4): qty 2

## 2021-04-08 MED ORDER — ACETAMINOPHEN 325 MG PO TABS: 650.0000 mg | ORAL_TABLET | Freq: Four times a day (QID) | ORAL | Status: DC | PRN

## 2021-04-08 MED ORDER — ACETAMINOPHEN 650 MG RE SUPP: 650.0000 mg | Freq: Four times a day (QID) | RECTAL | Status: DC | PRN

## 2021-04-08 MED ORDER — PROPOFOL 10 MG/ML IV BOLUS
INTRAVENOUS | Status: DC | PRN
  Administered 2021-04-08: 40 mg via INTRAVENOUS
  Administered 2021-04-08: 120 mg via INTRAVENOUS
  Administered 2021-04-08 (×3): 40 mg via INTRAVENOUS

## 2021-04-08 MED ORDER — ONDANSETRON HCL 4 MG PO TABS: 4.0000 mg | ORAL_TABLET | Freq: Four times a day (QID) | ORAL | Status: DC | PRN

## 2021-04-08 MED ORDER — ZOLPIDEM TARTRATE 5 MG PO TABS: 5.0000 mg | ORAL_TABLET | Freq: Every evening | ORAL | Status: DC | PRN

## 2021-04-08 MED ORDER — ZOLPIDEM TARTRATE 5 MG PO TABS
5.0000 mg | ORAL_TABLET | Freq: Every evening | ORAL | Status: DC | PRN
  Administered 2021-04-08 – 2021-04-10 (×3): 5 mg via ORAL
  Filled 2021-04-08 (×3): qty 1

## 2021-04-08 MED ORDER — CIPROFLOXACIN IN D5W 400 MG/200ML IV SOLN
400.0000 mg | Freq: Two times a day (BID) | INTRAVENOUS | Status: DC
  Administered 2021-04-08 – 2021-04-11 (×6): 400 mg via INTRAVENOUS
  Filled 2021-04-08 (×7): qty 200

## 2021-04-08 MED ORDER — ONDANSETRON HCL 4 MG/2ML IJ SOLN: 4.0000 mg | Freq: Four times a day (QID) | INTRAMUSCULAR | Status: DC | PRN

## 2021-04-08 MED ORDER — HYDROMORPHONE HCL 1 MG/ML IJ SOLN: 0.5000 mg | INTRAMUSCULAR | Status: DC | PRN

## 2021-04-08 MED ORDER — SODIUM CHLORIDE 0.9% FLUSH
3.0000 mL | Freq: Two times a day (BID) | INTRAVENOUS | Status: DC
  Administered 2021-04-08 – 2021-04-10 (×4): 3 mL via INTRAVENOUS

## 2021-04-08 MED ORDER — LACTATED RINGERS IV SOLN: INTRAVENOUS | Status: DC

## 2021-04-08 MED ORDER — SODIUM CHLORIDE 0.9 % IV SOLN
1.0000 g | INTRAVENOUS | Status: DC
  Filled 2021-04-08: qty 10

## 2021-04-08 MED ORDER — SODIUM CHLORIDE 0.9 % IV SOLN
50.0000 ug/h | INTRAVENOUS | Status: AC
  Administered 2021-04-08 – 2021-04-11 (×8): 50 ug/h via INTRAVENOUS
  Filled 2021-04-08 (×9): qty 1

## 2021-04-08 MED ORDER — LIDOCAINE HCL (PF) 2 % IJ SOLN
INTRAMUSCULAR | Status: AC
  Filled 2021-04-08: qty 5

## 2021-04-08 MED ORDER — LIDOCAINE HCL (CARDIAC) PF 100 MG/5ML IV SOSY
PREFILLED_SYRINGE | INTRAVENOUS | Status: DC | PRN
  Administered 2021-04-08: 100 mg via INTRAVENOUS

## 2021-04-08 NOTE — ED Notes (Signed)
Patient being transported to endo at this time

## 2021-04-08 NOTE — ED Notes (Signed)
Pt taken for U/S

## 2021-04-08 NOTE — ED Notes (Signed)
Phlebotomist at bedside.

## 2021-04-08 NOTE — ED Notes (Signed)
Per pharmacy hold rocephin d/t allergy to penicillin

## 2021-04-08 NOTE — OR Nursing (Signed)
Esophageal Varices found, and banded.  4 bands placed.

## 2021-04-08 NOTE — Anesthesia Preprocedure Evaluation (Addendum)
Anesthesia Evaluation  Patient identified by MRN, date of birth, ID band Patient awake    Reviewed: Allergy & Precautions, NPO status , Patient's Chart, lab work & pertinent test results  History of Anesthesia Complications Negative for: history of anesthetic complications  Airway Mallampati: II  TM Distance: >3 FB Neck ROM: Full    Dental no notable dental hx.    Pulmonary neg pulmonary ROS, Current Smoker and Patient abstained from smoking.,    Pulmonary exam normal breath sounds clear to auscultation       Cardiovascular Exercise Tolerance: Good METS: 3 - Mets hypertension, Pt. on medications (-) CHF negative cardio ROS Normal cardiovascular exam Rhythm:Regular Rate:Normal     Neuro/Psych PSYCHIATRIC DISORDERS Anxiety Depression  Neuromuscular disease (Bilat CTS)    GI/Hepatic negative GI ROS, Neg liver ROS,   Endo/Other  negative endocrine ROS  Renal/GU negative Renal ROS  negative genitourinary   Musculoskeletal  (+) Arthritis ,   Abdominal   Peds  Hematology negative hematology ROS (+) anemia , Upper GI bleed   Anesthesia Other Findings   Reproductive/Obstetrics negative OB ROS                            Anesthesia Physical Anesthesia Plan  ASA: 2 and emergent  Anesthesia Plan: MAC   Post-op Pain Management:    Induction: Intravenous  PONV Risk Score and Plan:   Airway Management Planned: Natural Airway and Nasal Cannula  Additional Equipment:   Intra-op Plan:   Post-operative Plan:   Informed Consent: I have reviewed the patients History and Physical, chart, labs and discussed the procedure including the risks, benefits and alternatives for the proposed anesthesia with the patient or authorized representative who has indicated his/her understanding and acceptance.     Dental Advisory Given  Plan Discussed with: Anesthesiologist, CRNA and Surgeon  Anesthesia  Plan Comments: (Patient consented for risks of anesthesia including but not limited to:  - adverse reactions to medications - damage to eyes, teeth, lips or other oral mucosa - nerve damage due to positioning  - sore throat or hoarseness - Damage to heart, brain, nerves, lungs, other parts of body or loss of life  Patient voiced understanding.)        Anesthesia Quick Evaluation

## 2021-04-08 NOTE — Op Note (Signed)
[facility] Gastroenterology Patient Name: Shelly Stevenson Procedure Date: 04/08/2021 11:52 AM MRN: [id number] Account #: [id number] Date of Birth: 09/16/1965 Admit Type: Inpatient Age: 56 [facility]OM 4 Gender: Female Note Status: Finalized Instrument Name: Upper Endoscope [id number] Procedure:             Upper GI endoscopy Indications:           Hematemesis Providers:             Wyline MoodKiran Dorlene Footman MD, MD Referring MD:          Pasty Spillersracy N. Mclean-Scocuzza MD, MD (Referring MD) Medicines:             Monitored Anesthesia Care Complications:         No immediate complications. Procedure:             Pre-Anesthesia Assessment:                        - Prior to the procedure, a History and Physical was                         performed, and patient medications, allergies and                         sensitivities were reviewed. The patient's tolerance                         of previous anesthesia was reviewed.                        - The risks and benefits of the procedure and the                         sedation options and risks were discussed with the                         patient. All questions were answered and informed                         consent was obtained.                        - ASA Grade Assessment: III - A patient with severe                         systemic disease.                        After obtaining informed consent, the endoscope was                         passed under direct vision. Throughout the procedure,                         the patient's blood pressure, pulse, and oxygen                         saturations were monitored continuously. The Endoscope                         was  introduced through the mouth, and advanced to the                         third part of duodenum. The upper GI endoscopy was                         accomplished with ease. The patient tolerated the                         procedure well. Findings:      The  examined duodenum was normal.      Moderate portal hypertensive gastropathy was found in the gastric body.      Grade III varices were found in the lower third of the esophagus. They       were large in size. Red whale sign seen Four bands were successfully       placed. There was no bleeding at the end of the maneuver. Impression:            - Normal examined duodenum.                        - Portal hypertensive gastropathy.                        - Grade III esophageal varices. Banded.                        - No specimens collected. Recommendation:        - Return patient to hospital ward for ongoing care.                        - NPO today.                        - IV antibiotics for prophylaxis of GI bleeding in                         setting of cirrhosis which she likely has                        IV octreotide for 72 hours                        Monitor CBC closely if concern for further bleeding                         then needs TIPS at [facility]                        Abdominal ultrasound to evaluate for features of                         portal hypertension                        If no bleeding by tomorow can start on clears                        - Repeat upper endoscopy in 4 weeks for retreatment.                        -  Return to GI office in 2 weeks.                        - Commence on nadolol or carvidilol pior to discharge                         for prophylaxsis for future bleeding Procedure Code(s):     --- Professional ---                        [phone], Esophagogastroduodenoscopy, flexible,                         transoral; diagnostic, including collection of                         specimen(s) by brushing or washing, when performed                         (separate procedure) Diagnosis Code(s):     --- Professional ---                        K76.6, Portal hypertension                        K31.89, Other diseases of stomach and duodenum                         I85.00, Esophageal varices without bleeding                        K92.0, Hematemesis CPT copyright 2019 American Medical Association. All rights reserved. The codes documented in this report are preliminary and upon coder review may  be revised to meet current compliance requirements. [name], MD [name] MD, MD 04/08/2021 12:15:14 PM This report has been signed electronically. Number of Addenda: 0 Note Initiated On: 04/08/2021 11:52 AM Estimated Blood Loss:  Estimated blood loss: none.      [facility]

## 2021-04-08 NOTE — Transfer of Care (Signed)
Immediate Anesthesia Transfer of Care Note  Patient: Shelly Stevenson  Procedure(s) Performed: ESOPHAGOGASTRODUODENOSCOPY (EGD) WITH PROPOFOL  Patient Location: Endoscopy Unit  Anesthesia Type:MAC  Level of Consciousness: drowsy  Airway & Oxygen Therapy: Patient Spontanous Breathing and Patient connected to nasal cannula oxygen  Post-op Assessment: Report given to RN and Post -op Vital signs reviewed and stable  Post vital signs: Reviewed and stable  Last Vitals:  Vitals Value Taken Time  BP 111/73 04/08/21 1213  Temp    Pulse    Resp 16 04/08/21 1213  SpO2 99 % 04/08/21 1213    Last Pain:  Vitals:   04/08/21 1213  TempSrc:   PainSc: 0-No pain         Complications: No notable events documented.

## 2021-04-08 NOTE — Progress Notes (Signed)
PROGRESS NOTE    Kaislee Ogas  [id number] DOB: 1965-08-22 DOA: 04/07/2021 PCP: McLean-Scocuzza, Nino Glow, MD  211A/211A-AA   Assessment & Plan:   Principal Problem:   Upper GI bleed Active Problems:   Anxiety and depression   Psoriasis   Essential hypertension   Shelly Stevenson is a 56 y.o. female with hx of Zaidi and depression, psoriasis, hypertension, who presents after an episode of vomiting which she estimates to be a gallon of dark brown bloody material.    # Variceal bleed --EGD found large esophageal varices with features of recent bleeding, placed 4 bands. Plan: --start octreotide gtt and continue for 72 hours --keep NPO for today, per GI --cont MIVF@50  --start IV Cipro for ppx --US liver  # blood loss anemia --No prior hx of anemia.  Hgb dropped from 12.7 to 7.0 this morning. --1u pRBC today  # Cellulitis, ruled out --one little pimple-like bump over forehead, with no surrounding erythema or warmth --d/c doxycycline  Hypertension --hold home amlodipine and lasix  Anxiety/depression -resume home venlafaxine and Xanax PRN   Insomnia -continue Ambien PRN   DVT prophylaxis: SCD/Compression stockings Code Status: Full code  Family Communication:  Level of care: Med-Surg Dispo:   The patient is from: home Anticipated d/c is to: home Anticipated d/c date is: 2-3 days Patient currently is not medically ready to d/c due to: need to be on octreotide for 72 hours due to large varices   Subjective and Interval History:  No more vomiting.  Reported being hungry.    EGD found large esophageal varices with features of recent bleeding, placed 4 bands.   Objective: Vitals:   04/08/21 1016 04/08/21 1213 04/08/21 1217 04/08/21 1439  BP: 121/75 111/73 (!) 143/78 (!) 154/90  Pulse: 80  87 86  Resp: 16 16 18 16   Temp:   97.6 F (36.4 C) 97.9 F (36.6 C)  TempSrc:   Temporal Oral  SpO2: 100% 99% 98% 100%  Weight:      Height:        Intake/Output Summary  (Last 24 hours) at 04/08/2021 1610 Last data filed at 04/08/2021 1211 Gross per 24 hour  Intake 2013.33 ml  Output 0 ml  Net 2013.33 ml   Filed Weights   04/07/21 0916  Weight: 65.8 kg    Examination:   Constitutional: NAD, AAOx3 HEENT: conjunctivae and lids normal, EOMI CV: No cyanosis.   RESP: normal respiratory effort, on RA Extremities: No effusions, edema in BLE SKIN: warm, dry.  One small nodule with scab on the forehead, with no signs of cellulitis Neuro: II - XII grossly intact.   Psych: anxious mood and affect.     Data Reviewed: I have personally reviewed following labs and imaging studies  CBC: Recent Labs  Lab 04/07/21 0918 04/08/21 1103  WBC 8.7 4.7  HGB 8.7* 7.0*  HCT 25.8* 21.3*  MCV 90.5 91.4  PLT 243 [id number]   Basic Metabolic Panel: Recent Labs  Lab 04/07/21 0918 04/08/21 1103  NA 131* 134*  K 3.5 3.5  CL 97* 103  CO2 22 25  GLUCOSE 116* 92  BUN 35* 20  CREATININE 0.82 0.63  CALCIUM 9.0 8.3*   GFR: Estimated Creatinine Clearance: 71.5 mL/min (by C-G formula based on SCr of 0.63 mg/dL). Liver Function Tests: Recent Labs  Lab 04/07/21 0918  AST 29  ALT 17  ALKPHOS 140*  BILITOT 1.2  PROT 7.4  ALBUMIN 3.3*   Recent Labs  Lab 04/07/21 0918  LIPASE  27   No results for input(s): AMMONIA in the last 168 hours. Coagulation Profile: Recent Labs  Lab 04/07/21 0918  INR 1.2   Cardiac Enzymes: No results for input(s): CKTOTAL, CKMB, CKMBINDEX, TROPONINI in the last 168 hours. BNP (last 3 results) No results for input(s): PROBNP in the last 8760 hours. HbA1C: No results for input(s): HGBA1C in the last 72 hours. CBG: No results for input(s): GLUCAP in the last 168 hours. Lipid Profile: No results for input(s): CHOL, HDL, LDLCALC, TRIG, CHOLHDL, LDLDIRECT in the last 72 hours. Thyroid Function Tests: No results for input(s): TSH, T4TOTAL, FREET4, T3FREE, THYROIDAB in the last 72 hours. Anemia Panel: No results for input(s):  VITAMINB12, FOLATE, FERRITIN, TIBC, IRON, RETICCTPCT in the last 72 hours. Sepsis Labs: No results for input(s): PROCALCITON, LATICACIDVEN in the last 168 hours.  Recent Results (from the past 240 hour(s))  Resp Panel by RT-PCR (Flu A&B, Covid) Nasopharyngeal Swab     Status: None   Collection Time: 04/07/21  1:48 PM   Specimen: Nasopharyngeal Swab; Nasopharyngeal(NP) swabs in vial transport medium  Result Value Ref Range Status   SARS Coronavirus 2 by RT PCR NEGATIVE NEGATIVE Final    Comment: (NOTE) SARS-CoV-2 target nucleic acids are NOT DETECTED.  The SARS-CoV-2 RNA is generally detectable in upper respiratory specimens during the acute phase of infection. The lowest concentration of SARS-CoV-2 viral copies this assay can detect is 138 copies/mL. A negative result does not preclude SARS-Cov-2 infection and should not be used as the sole basis for treatment or other patient management decisions. A negative result may occur with  improper specimen collection/handling, submission of specimen other than nasopharyngeal swab, presence of viral mutation(s) within the areas targeted by this assay, and inadequate number of viral copies(<138 copies/mL). A negative result must be combined with clinical observations, patient history, and epidemiological information. The expected result is Negative.  Fact Sheet for Patients:  EntrepreneurPulse.com.au  Fact Sheet for Healthcare Providers:  IncredibleEmployment.be  This test is no t yet approved or cleared by the [place] FDA and  has been authorized for detection and/or diagnosis of SARS-CoV-2 by FDA under an Emergency Use Authorization (EUA). This EUA will remain  in effect (meaning this test can be used) for the duration of the COVID-19 declaration under Section 564(b)(1) of the Act, 21 U.S.C.section 360bbb-3(b)(1), unless the authorization is terminated  or revoked sooner.       Influenza A  by PCR NEGATIVE NEGATIVE Final   Influenza B by PCR NEGATIVE NEGATIVE Final    Comment: (NOTE) The Xpert Xpress SARS-CoV-2/FLU/RSV plus assay is intended as an aid in the diagnosis of influenza from Nasopharyngeal swab specimens and should not be used as a sole basis for treatment. Nasal washings and aspirates are unacceptable for Xpert Xpress SARS-CoV-2/FLU/RSV testing.  Fact Sheet for Patients: EntrepreneurPulse.com.au  Fact Sheet for Healthcare Providers: IncredibleEmployment.be  This test is not yet approved or cleared by the [place] FDA and has been authorized for detection and/or diagnosis of SARS-CoV-2 by FDA under an Emergency Use Authorization (EUA). This EUA will remain in effect (meaning this test can be used) for the duration of the COVID-19 declaration under Section 564(b)(1) of the Act, 21 U.S.C. section 360bbb-3(b)(1), unless the authorization is terminated or revoked.  Performed at [facility], [address], [place], [place] [address]       Radiology Studies: DG Abdomen Acute W/Chest  Result Date: 04/07/2021 CLINICAL DATA:  Abdominal pain.  Coffee-ground emesis. EXAM: DG  ABDOMEN ACUTE WITH 1 VIEW CHEST COMPARISON:  Chest radiograph 10/08/2020 FINDINGS: Embolization coils noted within the left upper quadrant of the abdomen. There is no evidence of dilated bowel loops or free intraperitoneal air. No radiopaque calculi or other significant radiographic abnormality is seen. Heart size and mediastinal contours are within normal limits. Both lungs are clear. IMPRESSION: Negative abdominal radiographs.  No acute cardiopulmonary disease. Electronically Signed   By: Kerby Moors M.D.   On: 04/07/2021 13:59   US LIVER DOPPLER  Result Date: 04/08/2021 CLINICAL DATA:  Cirrhosis and esophageal varices by EGD earlier today EXAM: DUPLEX ULTRASOUND OF LIVER TECHNIQUE: Color and duplex Doppler ultrasound was performed to  evaluate the hepatic in-flow and out-flow vessels. COMPARISON:  None. FINDINGS: Liver: Normal echogenicity but mildly heterogeneous. Surface nodularity compatible with cirrhosis. No focal lesion, mass or intrahepatic biliary ductal dilatation. Main Portal Vein size: 1.3 cm Portal Vein Velocities Main Prox:  33 cm/sec Main Mid: 25 cm/sec Main Dist:  29 cm/sec Right: 12 cm/sec Left: 16 cm/sec Hepatic Vein Velocities Right:  11 cm/sec Middle:  16 cm/sec Left:  22 cm/sec IVC: Present and patent with normal respiratory phasicity. Hepatic Artery Velocity:  90 cm/sec Splenic Vein Velocity:  16 cm/sec Spleen: 5.3 cm x 8.1 cm x 2.7 cm with a total volume of 59 cm^3 (411 cm^3 is upper limit normal) Portal Vein Occlusion/Thrombus: No Splenic Vein Occlusion/Thrombus: No Ascites: Small amount of perihepatic ascites Varices: Not appreciated by ultrasound Patent portal, hepatic and splenic veins with normal directional flow. Negative for portal vein occlusion or thrombus. IMPRESSION: Hepatic cirrhosis. Trace hepatic ascites Patent portal, hepatic and splenic veins as above. Electronically Signed   By: Jerilynn Mages.  Shick M.D.   On: 04/08/2021 15:53     Scheduled Meds:  sodium chloride   Intravenous Once   hydrocortisone cream   Topical TID   octreotide  50 mcg Intravenous Once   sodium chloride flush  3 mL Intravenous Q12H   Continuous Infusions:  sodium chloride     ciprofloxacin     octreotide  (SANDOSTATIN)    IV infusion       LOS: 1 day     Enzo Bi, MD [facility] If 7PM-7AM, please contact night-coverage 04/08/2021, 4:10 PM

## 2021-04-08 NOTE — Anesthesia Postprocedure Evaluation (Signed)
Anesthesia Post Note  Patient: Shelly Stevenson  Procedure(s) Performed: ESOPHAGOGASTRODUODENOSCOPY (EGD) WITH PROPOFOL  Patient location during evaluation: PACU Anesthesia Type: MAC Level of consciousness: awake and alert Pain management: pain level controlled Vital Signs Assessment: post-procedure vital signs reviewed and stable Respiratory status: spontaneous breathing, nonlabored ventilation, respiratory function stable and patient connected to nasal cannula oxygen Cardiovascular status: stable and blood pressure returned to baseline Postop Assessment: no apparent nausea or vomiting Anesthetic complications: no   No notable events documented.   Last Vitals:  Vitals:   04/08/21 1715 04/08/21 1800  BP: (!) 147/70 (!) 152/64  Pulse: 88   Resp: 18 18  Temp: 36.8 C 36.7 C  SpO2: 100% 98%    Last Pain:  Vitals:   04/08/21 1800  TempSrc: Oral  PainSc:                  Tonny Bollman

## 2021-04-08 NOTE — H&P (Signed)
[name], MD [address], [place], [place], [address] [address], [place], [place], [address] Phone: [phone]  Fax: [phone]  Primary Care Physician:  McLean-Scocuzza, Pasty Spillers, MD   Pre-Procedure History & Physical: HPI:  Shelly Stevenson is a 56 y.o. female is here for an endoscopy    Past Medical History:  Diagnosis Date   Anemia    with PICA sx's    Anxiety    Bacterial vaginosis    CHF (congestive heart failure) (HCC)    Chicken pox    Depression    HSV (herpes simplex virus) infection    Hypertension    Vitamin D deficiency     Past Surgical History:  Procedure Laterality Date   APPENDECTOMY     56 y.o.    AUGMENTATION MAMMAPLASTY Bilateral    COLONOSCOPY WITH PROPOFOL N/A 05/02/2017   Procedure: COLONOSCOPY WITH PROPOFOL;  Surgeon: Pasty Spillers, MD;  Location: [facility] ENDOSCOPY;  Service: Endoscopy;  Laterality: N/A;   FRACTURE SURGERY     Left thumb    Prior to Admission medications   Medication Sig Start Date End Date Taking? Authorizing Provider  ALPRAZolam Prudy Feeler) 0.5 MG tablet Take 0.5 mg by mouth daily as needed for anxiety.   Yes [provider]  amLODipine (NORVASC) 2.5 MG tablet Take 1 tablet (2.5 mg total) by mouth daily. 04/28/20  Yes McLean-Scocuzza, Pasty Spillers, MD  furosemide (LASIX) 40 MG tablet Take 1 tablet (40 mg total) by mouth daily. 10/08/20 10/08/21 Yes Arnaldo Natal, MD  venlafaxine XR (EFFEXOR-XR) 150 MG 24 hr capsule Take 1 capsule (150 mg total) by mouth daily. 04/28/20  Yes McLean-Scocuzza, Pasty Spillers, MD  zolpidem (AMBIEN CR) 6.25 MG CR tablet Take 1 tablet (6.25 mg total) by mouth at bedtime as needed for sleep. 10/08/20  Yes Arnaldo Natal, MD    Allergies as of 04/07/2021 - Review Complete 04/07/2021  Allergen Reaction Noted   Penicillins Swelling 09/28/2016   Codeine Rash 03/24/2017    Family History  Problem Relation Age of Onset   Arthritis Mother    Cancer Mother        breast    Hearing loss Mother    Hypertension Mother    Breast cancer Mother 56   Arthritis Father    Cancer Father        colon dx'ed age 65s    Hypertension Father    Heart disease Father        MI   Diabetes Father    Mental illness Son        [facility]    Social History   Socioeconomic History   Marital status: Divorced    Spouse name: Not on file   Number of children: Not on file   Years of education: Not on file   Highest education level: Not on file  Occupational History   Not on file  Tobacco Use   Smoking status: Every Day    Packs/day: 0.50    Types: Cigarettes   Smokeless tobacco: Never   Tobacco comments:    <1ppd x 30 years no FH lung cancer   Vaping Use   Vaping Use: Never used  Substance and Sexual Activity   Alcohol use: Yes   Drug use: Not Currently   Sexual activity: Yes    Comment: men  Other Topics Concern   Not on file  Social History Narrative   Works in [employer]  [employer] 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 [facility]    Long term boyfriend       12 grade education    Enjoys yard work    1 Administrator   3 kids 2 boys and 1 girl and 1 grandson [employer]    Social Determinants of Corporate investment banker Strain: Not on [employer] Insecurity: Not on file  Transportation Needs: Not on file  Physical Activity: Not on file  Stress: Not on file  Social Connections: Not on file  Intimate Partner Violence: Not on file    Review of Systems: See HPI, otherwise negative ROS  Physical Exam: BP 121/75 (BP Location: Right Arm)    Pulse 80    Temp 98 F (36.7 C) (Oral)    Resp 16    Ht 5\' 5"  (1.651 m)    Wt 65.8 kg    SpO2 100%    BMI 24.13 kg/m  General:   Alert,  pleasant and cooperative in NAD Head:  Normocephalic and atraumatic. Neck:  Supple; no masses or thyromegaly. Lungs:  Clear throughout to auscultation, normal respiratory  effort.    Heart:  +S1, +S2, Regular rate and rhythm, No edema. Abdomen:  Soft, nontender and nondistended. Normal bowel sounds, without guarding, and without rebound.   Neurologic:  Alert and  oriented x4;  grossly normal neurologically.  Impression/Plan: Shelly Stevenson is here for an endoscopy  to be performed for  evaluation of coffee groundemesis    Risks, benefits, limitations, and alternatives regarding endoscopy have been reviewed with the patient.  Questions have been answered.  All parties agreeable.   Marisa Hua, MD  04/08/2021, 11:22 AM

## 2021-04-09 ENCOUNTER — Encounter: Payer: Self-pay | Admitting: Gastroenterology

## 2021-04-09 LAB — BASIC METABOLIC PANEL
Anion gap: 5 (ref 5–15)
BUN: 13 mg/dL (ref 6–20)
CO2: 27 mmol/L (ref 22–32)
Calcium: 8.5 mg/dL — ABNORMAL LOW (ref 8.9–10.3)
Chloride: 105 mmol/L (ref 98–111)
Creatinine, Ser: 0.63 mg/dL (ref 0.44–1.00)
GFR, Estimated: >60 mL/min (ref >60)
Glucose, Bld: 122 mg/dL — ABNORMAL HIGH (ref 70–99)
Potassium: 3.4 mmol/L — ABNORMAL LOW (ref 3.5–5.1)
Sodium: 137 mmol/L (ref 135–145)

## 2021-04-09 LAB — CBC
HCT: 25.9 % — ABNORMAL LOW (ref 36.0–46.0)
Hemoglobin: 8.6 g/dL — ABNORMAL LOW (ref 12.0–15.0)
MCH: 30 pg (ref 26.0–34.0)
MCHC: 33.2 g/dL (ref 30.0–36.0)
MCV: 90.2 fL (ref 80.0–100.0)
Platelets: 216 10*3/uL (ref 150–400)
RBC: 2.87 MIL/uL — ABNORMAL LOW (ref 3.87–5.11)
RDW: 15 % (ref 11.5–15.5)
WBC: 5.6 10*3/uL (ref 4.0–10.5)
nRBC: 0 % (ref 0.0–0.2)

## 2021-04-09 LAB — TYPE AND SCREEN
ABO/RH(D): O POS
Antibody Screen: NEGATIVE
Unit division: 0

## 2021-04-09 LAB — BPAM RBC
Blood Product Expiration Date: 202301142359
ISSUE DATE / TIME: 202301081736
Unit Type and Rh: 5100

## 2021-04-09 LAB — MAGNESIUM: Magnesium: 2.1 mg/dL (ref 1.7–2.4)

## 2021-04-09 LAB — MRSA NEXT GEN BY PCR, NASAL: MRSA by PCR Next Gen: DETECTED — AB

## 2021-04-09 MED ORDER — POTASSIUM CHLORIDE CRYS ER 20 MEQ PO TBCR
40.0000 meq | EXTENDED_RELEASE_TABLET | Freq: Once | ORAL | Status: AC
  Administered 2021-04-09: 40 meq via ORAL
  Filled 2021-04-09: qty 2

## 2021-04-09 MED ORDER — CHLORHEXIDINE GLUCONATE CLOTH 2 % EX PADS
6.0000 | MEDICATED_PAD | Freq: Every day | CUTANEOUS | Status: DC
  Administered 2021-04-11: 6 via TOPICAL

## 2021-04-09 MED ORDER — MUPIROCIN 2 % EX OINT
1.0000 "application " | TOPICAL_OINTMENT | Freq: Two times a day (BID) | CUTANEOUS | Status: DC
  Administered 2021-04-09 – 2021-04-11 (×5): 1 via NASAL
  Filled 2021-04-09: qty 22
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
## 2021-04-10 LAB — CBC
HCT: 26.8 % — ABNORMAL LOW (ref 36.0–46.0)
Hemoglobin: 8.8 g/dL — ABNORMAL LOW (ref 12.0–15.0)
MCH: 30.4 pg (ref 26.0–34.0)
MCHC: 32.8 g/dL (ref 30.0–36.0)
MCV: 92.7 fL (ref 80.0–100.0)
Platelets: 245 10*3/uL (ref 150–400)
RBC: 2.89 MIL/uL — ABNORMAL LOW (ref 3.87–5.11)
RDW: 15.6 % — ABNORMAL HIGH (ref 11.5–15.5)
WBC: 5.5 10*3/uL (ref 4.0–10.5)
nRBC: 0 % (ref 0.0–0.2)

## 2021-04-10 LAB — BASIC METABOLIC PANEL
Anion gap: 5 (ref 5–15)
BUN: 9 mg/dL (ref 6–20)
CO2: 26 mmol/L (ref 22–32)
Calcium: 8.5 mg/dL — ABNORMAL LOW (ref 8.9–10.3)
Chloride: 104 mmol/L (ref 98–111)
Creatinine, Ser: 0.73 mg/dL (ref 0.44–1.00)
GFR, Estimated: >60 mL/min (ref >60)
Glucose, Bld: 102 mg/dL — ABNORMAL HIGH (ref 70–99)
Potassium: 3.8 mmol/L (ref 3.5–5.1)
Sodium: 135 mmol/L (ref 135–145)

## 2021-04-10 LAB — MAGNESIUM: Magnesium: 2 mg/dL (ref 1.7–2.4)

## 2021-04-10 MED ORDER — DOXYCYCLINE HYCLATE 100 MG PO TABS
100.0000 mg | ORAL_TABLET | Freq: Two times a day (BID) | ORAL | Status: DC
  Administered 2021-04-10 – 2021-04-11 (×3): 100 mg via ORAL
  Filled 2021-04-10 (×3): qty 1
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
## 2021-04-11 ENCOUNTER — Telehealth: Payer: Self-pay | Admitting: Gastroenterology

## 2021-04-11 DIAGNOSIS — K922 Gastrointestinal hemorrhage, unspecified: Secondary | ICD-10-CM

## 2021-04-11 DIAGNOSIS — K7469 Other cirrhosis of liver: Secondary | ICD-10-CM

## 2021-04-11 DIAGNOSIS — D62 Acute posthemorrhagic anemia: Secondary | ICD-10-CM

## 2021-04-11 DIAGNOSIS — I10 Essential (primary) hypertension: Secondary | ICD-10-CM

## 2021-04-11 LAB — BASIC METABOLIC PANEL
Anion gap: 5 (ref 5–15)
BUN: 9 mg/dL (ref 6–20)
CO2: 24 mmol/L (ref 22–32)
Calcium: 8.4 mg/dL — ABNORMAL LOW (ref 8.9–10.3)
Chloride: 106 mmol/L (ref 98–111)
Creatinine, Ser: 0.76 mg/dL (ref 0.44–1.00)
GFR, Estimated: >60 mL/min (ref >60)
Glucose, Bld: 102 mg/dL — ABNORMAL HIGH (ref 70–99)
Potassium: 3.7 mmol/L (ref 3.5–5.1)
Sodium: 135 mmol/L (ref 135–145)

## 2021-04-11 LAB — CBC
HCT: 27.9 % — ABNORMAL LOW (ref 36.0–46.0)
Hemoglobin: 9 g/dL — ABNORMAL LOW (ref 12.0–15.0)
MCH: 29.6 pg (ref 26.0–34.0)
MCHC: 32.3 g/dL (ref 30.0–36.0)
MCV: 91.8 fL (ref 80.0–100.0)
Platelets: 253 10*3/uL (ref 150–400)
RBC: 3.04 MIL/uL — ABNORMAL LOW (ref 3.87–5.11)
RDW: 15.5 % (ref 11.5–15.5)
WBC: 5.2 10*3/uL (ref 4.0–10.5)
nRBC: 0 % (ref 0.0–0.2)

## 2021-04-11 LAB — MAGNESIUM: Magnesium: 2 mg/dL (ref 1.7–2.4)

## 2021-04-11 MED ORDER — CIPROFLOXACIN HCL 500 MG PO TABS
500.0000 mg | ORAL_TABLET | Freq: Two times a day (BID) | ORAL | 0 refills | Status: AC
Start: 2021-04-12 — End: 2021-04-16

## 2021-04-11 NOTE — Progress Notes (Signed)
Patient being discharged. Medications reviewed and discharge paperwork reviewed. Patient verbalized full understanding. Pt mother is her ride home.

## 2021-04-11 NOTE — Telephone Encounter (Signed)
Talk with pt and she stated she will call back to sch appt.

## 2021-04-11 NOTE — Discharge Summary (Signed)
Physician Discharge Summary  Patient ID: Shelly Stevenson MRN: [id number] DOB/AGE: 1965/05/19 56 y.o.  Admit date: 04/07/2021 Discharge date: 04/11/2021  Admission Diagnoses:  Discharge Diagnoses:  Principal Problem:   Upper GI bleed Active Problems:   Anxiety and depression   Psoriasis   Essential hypertension   Discharged Condition: good  Hospital Course:   Shelly Stevenson is a 56 y.o. female with hx of Zaidi and depression, psoriasis, hypertension, who presents after an episode of vomiting which she estimates to be a gallon of dark brown bloody material.   Acute blood loss anemia Upper GI bleed secondary to esophageal varices due to portal hypertension. Portal hypertension gastropathy. Liver cirrhosis. Patient is treated with IV octreotide, EGD showed portal hypertension gastropathy. Patient will complete 72 hours of octreotide today before discharge. Patient has no additional bleeding.  Hemoglobin is a stable. At this point, she is medically stable to be discharged, she will be seen by Dr. Tobi Bastos as outpatient follow-up on work-up on liver cirrhosis.  Mild cellulitis. Appear to be a pimple, no longer significant today.  Essential hypertension plan Blood pressure is not elevated, did not restart amlodipine.  Anxiety/depression. Follow-up with PCP and continue home medicines.  Consults: GI  Significant Diagnostic Studies:   DUPLEX ULTRASOUND OF LIVER   TECHNIQUE: Color and duplex Doppler ultrasound was performed to evaluate the hepatic in-flow and out-flow vessels.   COMPARISON:  None.   FINDINGS: Liver: Normal echogenicity but mildly heterogeneous. Surface nodularity compatible with cirrhosis. No focal lesion, mass or intrahepatic biliary ductal dilatation.   Main Portal Vein size: 1.3 cm   Portal Vein Velocities   Main Prox:  33 cm/sec   Main Mid: 25 cm/sec   Main Dist:  29 cm/sec Right: 12 cm/sec Left: 16 cm/sec   Hepatic Vein Velocities   Right:  11  cm/sec   Middle:  16 cm/sec   Left:  22 cm/sec   IVC: Present and patent with normal respiratory phasicity.   Hepatic Artery Velocity:  90 cm/sec   Splenic Vein Velocity:  16 cm/sec   Spleen: 5.3 cm x 8.1 cm x 2.7 cm with a total volume of 59 cm^3 (411 cm^3 is upper limit normal)   Portal Vein Occlusion/Thrombus: No   Splenic Vein Occlusion/Thrombus: No   Ascites: Small amount of perihepatic ascites   Varices: Not appreciated by ultrasound   Patent portal, hepatic and splenic veins with normal directional flow. Negative for portal vein occlusion or thrombus.   IMPRESSION: Hepatic cirrhosis.   Trace hepatic ascites   Patent portal, hepatic and splenic veins as above.     Electronically Signed   By: Judie Petit.  Shick M.D.   On: 04/08/2021 15:53  EGD  Normal examined duodenum. - Portal hypertensive gastropathy. - Grade III esophageal varices. Banded. - No specimens collected.  Treatments: Octreotide.  Discharge Exam: Blood pressure (!) 111/58, pulse 68, temperature 97.9 F (36.6 C), temperature source Oral, resp. rate 18, height 5\' 5"  (1.651 m), weight 65.8 kg, SpO2 98 %. General appearance: alert and cooperative Resp: clear to auscultation bilaterally Cardio: regular rate and rhythm, S1, S2 normal, no murmur, click, rub or gallop GI: soft, non-tender; bowel sounds normal; no masses,  no organomegaly Extremities: extremities normal, atraumatic, no cyanosis or edema  Disposition: Discharge disposition: 01-Home or Self Care       Discharge Instructions     Diet - low sodium heart healthy   Complete by: As directed    Increase activity slowly   Complete by:  As directed       Allergies as of 04/11/2021       Reactions   Penicillins Swelling   Angioedema   Codeine Rash   Elevated heartrate        Medication List     STOP taking these medications    amLODipine 2.5 MG tablet Commonly known as: NORVASC   furosemide 40 MG tablet Commonly known  as: Lasix       TAKE these medications    ALPRAZolam 0.5 MG tablet Commonly known as: XANAX Take 0.5 mg by mouth daily as needed for anxiety.   ciprofloxacin 500 MG tablet Commonly known as: Cipro Take 1 tablet (500 mg total) by mouth 2 (two) times daily for 4 days. Start taking on: April 12, 2021   venlafaxine XR 150 MG 24 hr capsule Commonly known as: EFFEXOR-XR Take 1 capsule (150 mg total) by mouth daily.   zolpidem 6.25 MG CR tablet Commonly known as: Ambien CR Take 1 tablet (6.25 mg total) by mouth at bedtime as needed for sleep.        Follow-up Information     McLean-Scocuzza, Pasty Spillers, MD Follow up in 1 week(s).   Specialty: Internal Medicine Contact information: [address] [place] [address] [phone]         [name], MD Follow up in 2 week(s).   Specialty: Gastroenterology Contact information: [address] [place] [address] [phone]                 Signed: Marrion Coy 04/11/2021, 10:28 AM

## 2021-04-11 NOTE — TOC Transition Note (Signed)
Transition of Care [facility]) - CM/SW Discharge Note   Patient Details  Name: Shelly Stevenson MRN: [id number] Date of Birth: 09-Jan-1966  Transition of Care [facility]) CM/SW Contact:  Beverly Sessions, RN Phone Number: 04/11/2021, 2:28 PM   Clinical Narrative:    Printed patient goodrx coupon, and confirmed she would be able to obtain at discharge          Patient Goals and CMS Choice        Discharge Placement                       Discharge Plan and Services                                     Social Determinants of Health (SDOH) Interventions     Readmission Risk Interventions No flowsheet data found.

## 2021-04-12 ENCOUNTER — Encounter: Payer: Self-pay | Admitting: Internal Medicine

## 2021-04-12 ENCOUNTER — Other Ambulatory Visit: Payer: Self-pay

## 2021-04-12 ENCOUNTER — Ambulatory Visit (INDEPENDENT_AMBULATORY_CARE_PROVIDER_SITE_OTHER): Payer: Medicaid Other | Admitting: Internal Medicine

## 2021-04-12 ENCOUNTER — Telehealth: Payer: Self-pay

## 2021-04-12 VITALS — BP 106/70 | HR 83 | Temp 96.9°F | Ht 65.0 in | Wt 155.0 lb

## 2021-04-12 DIAGNOSIS — F339 Major depressive disorder, recurrent, unspecified: Secondary | ICD-10-CM | POA: Diagnosis not present

## 2021-04-12 DIAGNOSIS — K7031 Alcoholic cirrhosis of liver with ascites: Secondary | ICD-10-CM | POA: Insufficient documentation

## 2021-04-12 DIAGNOSIS — K766 Portal hypertension: Secondary | ICD-10-CM

## 2021-04-12 DIAGNOSIS — T148XXA Other injury of unspecified body region, initial encounter: Secondary | ICD-10-CM

## 2021-04-12 DIAGNOSIS — F419 Anxiety disorder, unspecified: Secondary | ICD-10-CM

## 2021-04-12 DIAGNOSIS — L299 Pruritus, unspecified: Secondary | ICD-10-CM

## 2021-04-12 DIAGNOSIS — G4700 Insomnia, unspecified: Secondary | ICD-10-CM | POA: Diagnosis not present

## 2021-04-12 DIAGNOSIS — I8500 Esophageal varices without bleeding: Secondary | ICD-10-CM

## 2021-04-12 DIAGNOSIS — E559 Vitamin D deficiency, unspecified: Secondary | ICD-10-CM

## 2021-04-12 DIAGNOSIS — F1010 Alcohol abuse, uncomplicated: Secondary | ICD-10-CM

## 2021-04-12 DIAGNOSIS — Z1231 Encounter for screening mammogram for malignant neoplasm of breast: Secondary | ICD-10-CM

## 2021-04-12 MED ORDER — POTASSIUM CHLORIDE ER 10 MEQ PO TBCR
20.0000 meq | EXTENDED_RELEASE_TABLET | Freq: Every day | ORAL | 2 refills | Status: DC | PRN
  Filled 2021-04-12: qty 60, 30d supply, fill #0

## 2021-04-12 MED ORDER — VITAMIN D (ERGOCALCIFEROL) 1.25 MG (50000 UNIT) PO CAPS
50000.0000 [IU] | ORAL_CAPSULE | ORAL | 1 refills | Status: AC
  Filled 2021-04-12: qty 13, 91d supply, fill #0
  Filled 2021-04-16: qty 4, 28d supply, fill #0
  Filled 2021-05-25: qty 4, 28d supply, fill #1

## 2021-04-12 MED ORDER — MUPIROCIN 2 % EX OINT
1.0000 "application " | TOPICAL_OINTMENT | Freq: Two times a day (BID) | CUTANEOUS | 0 refills | Status: DC
  Filled 2021-04-12: qty 22, 30d supply, fill #0

## 2021-04-12 MED ORDER — HYDROXYZINE PAMOATE 25 MG PO CAPS
25.0000 mg | ORAL_CAPSULE | Freq: Two times a day (BID) | ORAL | 2 refills | Status: DC | PRN
  Filled 2021-04-12: qty 60, 30d supply, fill #0

## 2021-04-12 MED ORDER — FUROSEMIDE 20 MG PO TABS
20.0000 mg | ORAL_TABLET | Freq: Every day | ORAL | 3 refills | Status: AC | PRN
  Filled 2021-04-12: qty 30, 30d supply, fill #0

## 2021-04-12 NOTE — Telephone Encounter (Signed)
I saw today will do this for todays visit

## 2021-04-12 NOTE — Progress Notes (Signed)
Chief Complaint  Patient presents with   Anxiety   Ulcer   Insomnia   HFU 1/7-1/11/23 for UGIB and ascites with liver cirrhosis and portal htn and esophageal varices grade 3. Prior to hospital she was vomitting blood brown  but stopped now she denies drinking etoh   H/o chronic insomnia, etoh abuse though currently not drinking, anxiety, depression though states she is not depressed  Rec pt call [facility] or f/u with [facility]   Wound/cyst to left forehead improved on cipro and lower leg right wound   Htn contolled off BP meds norvasc 2.5 but may consider BB with GI for above  Review of Systems  Constitutional:  Negative for weight loss.  HENT:  Negative for hearing loss.   Eyes:  Negative for blurred vision.  Respiratory:  Negative for shortness of breath.   Cardiovascular:  Negative for chest pain.  Gastrointestinal:  Negative for abdominal pain and blood in stool.  Genitourinary:  Negative for dysuria.  Musculoskeletal:  Negative for falls and joint pain.  Skin:  Negative for rash.  Neurological:  Negative for headaches.  Psychiatric/Behavioral:  Negative for depression.   Past Medical History:  Diagnosis Date   Anemia    with PICA sx's    Anxiety    Bacterial vaginosis    CHF (congestive heart failure) (HCC)    Chicken pox    Depression    HSV (herpes simplex virus) infection    Hypertension    Vitamin D deficiency    Past Surgical History:  Procedure Laterality Date   APPENDECTOMY     56 y.o.    AUGMENTATION MAMMAPLASTY Bilateral    COLONOSCOPY WITH PROPOFOL N/A 05/02/2017   Procedure: COLONOSCOPY WITH PROPOFOL;  Surgeon: Virgel Manifold, MD;  Location: [facility] ENDOSCOPY;  Service: Endoscopy;  Laterality: N/A;   ESOPHAGOGASTRODUODENOSCOPY (EGD) WITH PROPOFOL N/A 04/08/2021   Procedure: ESOPHAGOGASTRODUODENOSCOPY (EGD) WITH PROPOFOL;  Surgeon: Jonathon Bellows, MD;  Location: [facility] ENDOSCOPY;  Service: Gastroenterology;  Laterality: N/A;   FRACTURE SURGERY     Left thumb    Family History  Problem Relation Age of Onset   Arthritis Mother    Cancer Mother        breast   Hearing loss Mother    Hypertension Mother    Breast cancer Mother 32   Arthritis Father    Cancer Father        colon dx'ed age 32s    Hypertension Father    Heart disease Father        MI   Diabetes Father    Mental illness Son        [facility]   Social History   Socioeconomic History   Marital status: Divorced    Spouse name: Not on file   Number of children: Not on file   Years of education: Not on file   Highest education level: Not on file  Occupational History   Not on file  Tobacco Use   Smoking status: Every Day    Packs/day: 0.50    Types: Cigarettes   Smokeless tobacco: Never   Tobacco comments:    <1ppd x 30 years no FH lung cancer   Vaping Use   Vaping Use: Never used  Substance and Sexual Activity   Alcohol use: Yes   Drug use: Not Currently   Sexual activity: Yes    Comment: men  Other Topics Concern   Not on file  Social History Narrative   Works in [employer]  [place] person is Shelly Stevenson as of up until 12/2018    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 D3 x 6 months   ciprofloxacin (CIPRO) 500 MG tablet Take 1 tablet (500 mg total) by mouth 2 (two) times daily for 4 days.   furosemide (LASIX) 20 MG tablet Take 1 tablet (20 mg total) by  mouth daily as needed. (Abdominal bloating/leg swelling)   hydrOXYzine (VISTARIL) 25 MG capsule Take 1 capsule (25 mg total) by mouth 2 (two) times daily as needed.   mupirocin ointment (BACTROBAN) 2 % Apply 1 application topically 2 (two) times daily. (Face wound and leg wound)   potassium chloride (KLOR-CON) 10 MEQ tablet Take 2 tablets (20 mEq total) by mouth daily as needed.   Allergies  Allergen Reactions   Penicillins Swelling    Angioedema   Codeine Rash    Elevated heartrate   Recent Results (from the past 2160 hour(s))  Comprehensive metabolic panel     Status: Abnormal   Collection Time: 04/07/21  9:18 AM  Result Value Ref Range   Sodium 131 (L) 135 - 145 mmol/L   Potassium 3.5 3.5 - 5.1 mmol/L   Chloride 97 (L) 98 - 111 mmol/L   CO2 22 22 - 32 mmol/L   Glucose, Bld 116 (H) 70 - 99 mg/dL    Comment: Glucose reference range applies only to samples taken after fasting for at least 8 hours.   BUN 35 (H) 6 - 20 mg/dL   Creatinine, Ser 0.82 0.44 - 1.00 mg/dL   Calcium 9.0 8.9 - 10.3 mg/dL   Total Protein 7.4 6.5 - 8.1 g/dL   Albumin 3.3 (L) 3.5 - 5.0 g/dL   AST 29 15 - 41 U/L   ALT 17 0 - 44 U/L   Alkaline Phosphatase 140 (H) 38 - 126 U/L   Total Bilirubin 1.2 0.3 - 1.2 mg/dL   GFR, Estimated >60 >60 mL/min    Comment: (NOTE) Calculated using the CKD-EPI Creatinine Equation (2021)    Anion gap 12 5 - 15    Comment: Performed at [facility], [address] [place], [place] [address]  CBC     Status: Abnormal   Collection Time: 04/07/21  9:18 AM  Result Value Ref Range   WBC 8.7 4.0 - 10.5 K/uL   RBC 2.85 (L) 3.87 - 5.11 MIL/uL   Hemoglobin 8.7 (L) 12.0 - 15.0 g/dL   HCT 25.8 (L) 36.0 - 46.0 %   MCV 90.5 80.0 - 100.0 fL   MCH 30.5 26.0 - 34.0 pg   MCHC 33.7 30.0 - 36.0 g/dL   RDW 15.1 11.5 - 15.5 %   Platelets 243 150 - 400 K/uL   nRBC 0.0 0.0 -  0.2 %    Comment: Performed at [facility], [address] [place], [place] [address]   [place] - (order if Patient is taking Coumadin / Warfarin)     Status: Abnormal   Collection Time: 04/07/21  9:18 AM  Result Value Ref Range   Prothrombin Time 15.5 (H) 11.4 - 15.2 seconds   INR 1.2 0.8 - 1.2    Comment: (NOTE) INR goal varies based on device and disease states. Performed at [facility], [place], [place] [address]   Troponin I (High Sensitivity)     Status: None   Collection Time: 04/07/21  9:18 AM  Result Value Ref Range   Troponin I (High Sensitivity) 10 <18 ng/L    Comment: (NOTE) Elevated high sensitivity troponin I (hsTnI) values and significant  changes across serial measurements may suggest ACS but many other  chronic and acute conditions are known to elevate hsTnI results.  Refer to the "Links" section for chest pain algorithms and additional  guidance. Performed at [facility], [address] [place], [place] [address]   Lipase, blood     Status: None   Collection Time: 04/07/21  9:18 AM  Result Value Ref Range   Lipase 27 11 - 51 U/L    Comment: Performed at [facility], [address] [place], [place] [address]  Ethanol     Status: None   Collection Time: 04/07/21  9:18 AM  Result Value Ref Range   Alcohol, Ethyl (B) <10 <10 mg/dL    Comment: (NOTE) Lowest detectable limit for serum alcohol is 10 mg/dL.  For medical purposes only. Performed at [facility], [address] [place], [place] [address]   Type and screen Ordered by PROVIDER DEFAULT     Status: None   Collection Time: 04/07/21 10:34 AM  Result Value Ref Range   ABO/RH(D) O POS    Antibody Screen NEG    Sample Expiration 04/10/2021,2359    Unit Number [id number]    Blood Component Type RBC LR PHER2    Unit division 00    Status of Unit ISSUED,FINAL    Transfusion Status OK TO TRANSFUSE    Crossmatch Result      Compatible Performed at [facility], [address] [place], [place] [address]    BPAM RBC     Status: None   Collection Time: 04/07/21 10:34 AM  Result Value Ref Range   ISSUE DATE / TIME [id number]    Blood Product Unit Number [id number]    PRODUCT CODE [id number]    Unit Type and Rh 5100    Blood Product Expiration Date [id number]   Resp Panel by RT-PCR (Flu A&B, Covid) Nasopharyngeal Swab     Status: None   Collection Time: 04/07/21  1:48 PM   Specimen: Nasopharyngeal Swab; Nasopharyngeal(NP) swabs in vial transport medium  Result Value Ref Range   SARS Coronavirus 2 by RT PCR NEGATIVE NEGATIVE    Comment: (NOTE) SARS-CoV-2 target nucleic acids are NOT DETECTED.  The SARS-CoV-2 RNA is generally detectable in upper respiratory specimens during the acute phase of infection. The lowest concentration of SARS-CoV-2 viral copies this assay can detect is 138 copies/mL. A negative result does not preclude SARS-Cov-2 infection and should not be used as the sole basis for treatment or other patient management decisions. A negative result may occur with  improper specimen collection/handling, submission of specimen other than nasopharyngeal swab, presence of viral mutation(s) within the areas  targeted by this assay, and inadequate number of viral copies(<138 copies/mL). A negative result must be combined with clinical observations, patient history, and epidemiological information. The expected result is Negative.  Fact Sheet for Patients:  EntrepreneurPulse.com.au  Fact Sheet for Healthcare Providers:  IncredibleEmployment.be  This test is no t yet approved or cleared by the [place] FDA and  has been authorized for detection and/or diagnosis of SARS-CoV-2 by FDA under an Emergency Use Authorization (EUA). This EUA will remain  in effect (meaning this test can be used) for the duration of the COVID-19 declaration under Section 564(b)(1) of the Act, 21 U.S.C.section 360bbb-3(b)(1), unless the authorization is  terminated  or revoked sooner.       Influenza A by PCR NEGATIVE NEGATIVE   Influenza B by PCR NEGATIVE NEGATIVE    Comment: (NOTE) The Xpert Xpress SARS-CoV-2/FLU/RSV plus assay is intended as an aid in the diagnosis of influenza from Nasopharyngeal swab specimens and should not be used as a sole basis for treatment. Nasal washings and aspirates are unacceptable for Xpert Xpress SARS-CoV-2/FLU/RSV testing.  Fact Sheet for Patients: EntrepreneurPulse.com.au  Fact Sheet for Healthcare Providers: IncredibleEmployment.be  This test is not yet approved or cleared by the [place] FDA and has been authorized for detection and/or diagnosis of SARS-CoV-2 by FDA under an Emergency Use Authorization (EUA). This EUA will remain in effect (meaning this test can be used) for the duration of the COVID-19 declaration under Section 564(b)(1) of the Act, 21 U.S.C. section 360bbb-3(b)(1), unless the authorization is terminated or revoked.  Performed at [facility], [address] [place], [place] [address]   CBC     Status: Abnormal   Collection Time: 04/08/21 11:03 AM  Result Value Ref Range   WBC 4.7 4.0 - 10.5 K/uL   RBC 2.33 (L) 3.87 - 5.11 MIL/uL   Hemoglobin 7.0 (L) 12.0 - 15.0 g/dL   HCT 21.3 (L) 36.0 - 46.0 %   MCV 91.4 80.0 - 100.0 fL   MCH 30.0 26.0 - 34.0 pg   MCHC 32.9 30.0 - 36.0 g/dL   RDW 15.8 (H) 11.5 - 15.5 %   Platelets 191 150 - 400 K/uL   nRBC 0.0 0.0 - 0.2 %    Comment: Performed at [facility], [address] [place], [place] [id number]  Basic metabolic panel     Status: Abnormal   Collection Time: 04/08/21 11:03 AM  Result Value Ref Range   Sodium 134 (L) 135 - 145 mmol/L   Potassium 3.5 3.5 - 5.1 mmol/L   Chloride 103 98 - 111 mmol/L   CO2 25 22 - 32 mmol/L   Glucose, Bld 92 70 - 99 mg/dL    Comment: Glucose reference range applies only to samples taken after fasting for at least 8 hours.   BUN  20 6 - 20 mg/dL   Creatinine, Ser 0.63 0.44 - 1.00 mg/dL   Calcium 8.3 (L) 8.9 - 10.3 mg/dL   GFR, Estimated >60 >60 mL/min    Comment: (NOTE) Calculated using the CKD-EPI Creatinine Equation (2021)    Anion gap 6 5 - 15    Comment: Performed at [facility], [address] [place], [place] [address]  ABO/Rh     Status: None   Collection Time: 04/08/21 11:03 AM  Result Value Ref Range   ABO/RH(D)      O POS Performed at [facility] [address], [place], [place] [address]   Prepare RBC (  crossmatch)     Status: None   Collection Time: 04/08/21  2:44 PM  Result Value Ref Range   Order Confirmation      ORDER PROCESSED BY BLOOD BANK Performed at [facility], [address] [place], [place] [id number]   Basic metabolic panel     Status: Abnormal   Collection Time: 04/09/21  4:23 AM  Result Value Ref Range   Sodium 137 135 - 145 mmol/L   Potassium 3.4 (L) 3.5 - 5.1 mmol/L   Chloride 105 98 - 111 mmol/L   CO2 27 22 - 32 mmol/L   Glucose, Bld 122 (H) 70 - 99 mg/dL    Comment: Glucose reference range applies only to samples taken after fasting for at least 8 hours.   BUN 13 6 - 20 mg/dL   Creatinine, Ser 0.63 0.44 - 1.00 mg/dL   Calcium 8.5 (L) 8.9 - 10.3 mg/dL   GFR, Estimated >60 >60 mL/min    Comment: (NOTE) Calculated using the CKD-EPI Creatinine Equation (2021)    Anion gap 5 5 - 15    Comment: Performed at [facility], [address] [place], [place] [address]  CBC     Status: Abnormal   Collection Time: 04/09/21  4:23 AM  Result Value Ref Range   WBC 5.6 4.0 - 10.5 K/uL   RBC 2.87 (L) 3.87 - 5.11 MIL/uL   Hemoglobin 8.6 (L) 12.0 - 15.0 g/dL   HCT 25.9 (L) 36.0 - 46.0 %   MCV 90.2 80.0 - 100.0 fL   MCH 30.0 26.0 - 34.0 pg   MCHC 33.2 30.0 - 36.0 g/dL   RDW 15.0 11.5 - 15.5 %   Platelets 216 150 - 400 K/uL   nRBC 0.0 0.0 - 0.2 %    Comment: Performed at [facility], [address] [place], [place] [address]   Magnesium     Status: None   Collection Time: 04/09/21  4:23 AM  Result Value Ref Range   Magnesium 2.1 1.7 - 2.4 mg/dL    Comment: Performed at [facility], [address], [place], [place] [address]  MRSA Next Gen by PCR, Nasal     Status: Abnormal   Collection Time: 04/09/21 11:16 AM   Specimen: Nasal Mucosa; Nasal Swab  Result Value Ref Range   MRSA by PCR Next Gen DETECTED (A) NOT DETECTED    Comment: RESULT CALLED TO, READ BACK BY AND VERIFIED WITH: KAYLA BYRD 04/09/21 1324 (NOTE) The GeneXpert MRSA Assay (FDA approved for NASAL specimens only), is one component of a comprehensive MRSA colonization surveillance program. It is not intended to diagnose MRSA infection nor to guide or monitor treatment for MRSA infections. Test performance is not FDA approved in patients less than 59 years old. Performed at [facility], [address] [place], [place] [id number]   Basic metabolic panel     Status: Abnormal   Collection Time: 04/10/21  4:27 AM  Result Value Ref Range   Sodium 135 135 - 145 mmol/L   Potassium 3.8 3.5 - 5.1 mmol/L   Chloride 104 98 - 111 mmol/L   CO2 26 22 - 32 mmol/L   Glucose, Bld 102 (H) 70 - 99 mg/dL    Comment: Glucose reference range applies only to samples taken after fasting for at least 8 hours.   BUN 9 6 - 20 mg/dL   Creatinine, Ser 0.73 0.44 - 1.00 mg/dL   Calcium 8.5 (L) 8.9 -  10.3 mg/dL   GFR, Estimated >60 >60 mL/min    Comment: (NOTE) Calculated using the CKD-EPI Creatinine Equation (2021)    Anion gap 5 5 - 15    Comment: Performed at [facility], [address] [place], [place] [address]  CBC     Status: Abnormal   Collection Time: 04/10/21  4:27 AM  Result Value Ref Range   WBC 5.5 4.0 - 10.5 K/uL   RBC 2.89 (L) 3.87 - 5.11 MIL/uL   Hemoglobin 8.8 (L) 12.0 - 15.0 g/dL   HCT 26.8 (L) 36.0 - 46.0 %   MCV 92.7 80.0 - 100.0 fL   MCH 30.4 26.0 - 34.0 pg   MCHC 32.8 30.0 - 36.0 g/dL   RDW 15.6 (H) 11.5 -  15.5 %   Platelets 245 150 - 400 K/uL   nRBC 0.0 0.0 - 0.2 %    Comment: Performed at [facility], [address] [place], [place] [address]  Magnesium     Status: None   Collection Time: 04/10/21  4:27 AM  Result Value Ref Range   Magnesium 2.0 1.7 - 2.4 mg/dL    Comment: Performed at [facility], [address] [place], [place] [id number]  Basic metabolic panel     Status: Abnormal   Collection Time: 04/11/21  4:32 AM  Result Value Ref Range   Sodium 135 135 - 145 mmol/L   Potassium 3.7 3.5 - 5.1 mmol/L   Chloride 106 98 - 111 mmol/L   CO2 24 22 - 32 mmol/L   Glucose, Bld 102 (H) 70 - 99 mg/dL    Comment: Glucose reference range applies only to samples taken after fasting for at least 8 hours.   BUN 9 6 - 20 mg/dL   Creatinine, Ser 0.76 0.44 - 1.00 mg/dL   Calcium 8.4 (L) 8.9 - 10.3 mg/dL   GFR, Estimated >60 >60 mL/min    Comment: (NOTE) Calculated using the CKD-EPI Creatinine Equation (2021)    Anion gap 5 5 - 15    Comment: Performed at [facility], [address] [place], [place] [address]  CBC     Status: Abnormal   Collection Time: 04/11/21  4:32 AM  Result Value Ref Range   WBC 5.2 4.0 - 10.5 K/uL   RBC 3.04 (L) 3.87 - 5.11 MIL/uL   Hemoglobin 9.0 (L) 12.0 - 15.0 g/dL   HCT 27.9 (L) 36.0 - 46.0 %   MCV 91.8 80.0 - 100.0 fL   MCH 29.6 26.0 - 34.0 pg   MCHC 32.3 30.0 - 36.0 g/dL   RDW 15.5 11.5 - 15.5 %   Platelets 253 150 - 400 K/uL   nRBC 0.0 0.0 - 0.2 %    Comment: Performed at [facility], [address] [place], [place] [address]  Magnesium     Status: None   Collection Time: 04/11/21  4:32 AM  Result Value Ref Range   Magnesium 2.0 1.7 - 2.4 mg/dL    Comment: Performed at [facility], [address] [place],  [address]   Objective  Body mass index is 25.79 kg/m. Wt Readings from Last 3 Encounters:  04/12/21 155 lb (70.3 kg)  04/07/21 145 lb (65.8 kg)  10/08/20 145 lb (65.8 kg)   Temp  Readings from Last 3 Encounters:  04/12/21 (!) 96.9 F (36.1 C) (Temporal)  04/11/21 97.9 F (36.6 C) (Oral)  10/08/20 97.9 F (36.6 C) (Oral)   BP  Readings from Last 3 Encounters:  04/12/21 106/70  04/11/21 (!) 111/58  10/08/20 (!) 145/89   Pulse Readings from Last 3 Encounters:  04/12/21 83  04/11/21 68  10/08/20 91    Physical Exam Vitals and nursing note reviewed.  Constitutional:      Appearance: Normal appearance. She is well-developed and well-groomed.  HENT:     Head: Normocephalic and atraumatic.  Eyes:     Conjunctiva/sclera: Conjunctivae normal.     Pupils: Pupils are equal, round, and reactive to light.  Cardiovascular:     Rate and Rhythm: Normal rate and regular rhythm.     Heart sounds: Normal heart sounds. No murmur heard. Pulmonary:     Effort: Pulmonary effort is normal.     Breath sounds: Normal breath sounds.  Abdominal:     General: Abdomen is flat. Bowel sounds are normal.     Tenderness: There is no abdominal tenderness.  Musculoskeletal:        General: No tenderness.  Skin:    General: Skin is warm and dry.  Neurological:     General: No focal deficit present.     Mental Status: She is alert and oriented to person, place, and time. Mental status is at baseline.     Cranial Nerves: Cranial nerves 2-12 are intact.     Gait: Gait is intact.  Psychiatric:        Attention and Perception: Attention and perception normal.        Mood and Affect: Mood and affect normal.        Speech: Speech normal.        Behavior: Behavior normal. Behavior is cooperative.        Thought Content: Thought content normal.        Cognition and Memory: Cognition and memory normal.        Judgment: Judgment normal.    Assessment  Plan  Alcoholic cirrhosis of liver with ascites ([place]) S/p GIB 04/2021 with anemia s/p 1 unit of blood  F/u GI 05/10/20 will repeat EGD and consider BB Will see if GI can f/u labs at f/u  Pt denies etoh use now but did in the past rec  cessatoin Grade 3 Esophageal varices without bleeding, unspecified esophageal varices type ([place]) Portal hypertension with esophageal varices ([place])    Alcohol abuse - Plan: Ambulatory referral to Psychiatry given resources Anxiety and depression - Plan: Ambulatory referral to Psychiatry cal [facility] or [facility] referred  Needs psych and therapy  Depression, recurrent ([place]) - Plan: Ambulatory referral to Psychiatry Insomnia, unspecified type - Plan: Ambulatory referral to Psychiatry  Vitamin D deficiency - Plan: Cholecalciferol 1.25 MG (50000 UT) capsule  Pruritic dermatitis - Plan: hydrOXYzine (VISTARIL) 25 MG capsule bid prn   Open wound - Plan: mupirocin ointment (BACTROBAN) 2 %   HM  Check labs to see what labs need to be done at f/u Flu declines, prevnar declines 1 pfizer doc. consider #2  utd Tdap 07/30/16  Had 3/3 hep B vaccines immune   Pap 04/28/17 neg neg hpv +BV -will need pap in the fuutre at f/u   mammo neg 07/08/17 neg sch 10/16/2018 with implants  -order in pt needs to schedule    Colonoscopy had 05/02/17 normal diverticulosis repeat in 5 years [facility]  Egd 04/2021 abnormal f/u in 4 weeks Dr. Jonathon Bellows   rec smoking cessation    Referred to dermatology in past Dr. Kellie Moor not yet been    Seeing rheumatology Dr.  Meda Coffee  saw  05/21/18 PSA arthritis   ent referral in the past has not been yet      Provider: Dr. Olivia Mackie McLean-[facility]oc[facility]

## 2021-04-12 NOTE — Patient Instructions (Addendum)
Call for psychiatry appointment and make appt [facility] or [facility] (takes [facility])   [facility] [place]  [address]  [place] [address] [phone]    [facility] [place]  [address] [place] [address]  [phone]  05/10/2021 at 1:00 PM Dr. [name] GI please follow up  Lasix 20 mg take if abdominal bloating or leg swelling with potassium     Phone Fax E-mail Address  [phone] Not available [address]   [address]   [place] [address]    Pick up multivitamin and take with ferrous gluconate 27 mg daily  Colace as needed stool softner   Sarna lotion for itching over the counter    Dove antibacterial soap over the counter bodywash    Cirrhosis Cirrhosis is long-term (chronic) liver injury. The liver is the body's largest internal organ, and it performs many functions. It converts food into energy, removes toxic material from the blood, makes important proteins, and absorbs necessary vitamins from food. In cirrhosis, healthy liver cells are replaced by scar tissue. This prevents blood from flowing through the liver and makes it difficult for the liver to complete its functions. What are the causes? Common causes of this condition are hepatitis C and long-term alcohol abuse. Other causes include: Nonalcoholic fatty liver disease (NAFLD). This happens when fat is deposited in the liver by causes other than alcohol. Hepatitis B infection. Autoimmune hepatitis. In this condition, the body's defense system (immune system) mistakenly attacks the liver cells, causing inflammation. Diseases that cause blockage of ducts inside the liver. Inherited liver diseases, such as hemochromatosis. This is one of the most common inherited liver diseases. In this disease, deposits of iron collect in the liver and other organs. Reactions to certain long-term medicines, such as  amiodarone, a heart medicine. Parasitic infections. These include schistosomiasis, which is caused by a flatworm. Long-term contact to certain toxins. These toxins include certain organic solvents, such as toluene and chloroform. What increases the risk? You are more likely to develop this condition if: You have certain types of viral hepatitis. You abuse alcohol, especially if you are female. You are overweight. You use IV drugs and share needles. You have unprotected sex with someone who has viral hepatitis. What are the signs or symptoms? You may not have any signs and symptoms at first. Symptoms may not develop until the damage to your liver starts to get worse. Early symptoms may include: Weakness and tiredness (fatigue). Changes in sleep patterns or having trouble sleeping. Itchiness. Tenderness in the right-upper part of your abdomen. Weight loss and muscle loss. Nausea. Loss of appetite. Later symptoms may include: Fatigue or weakness that is getting worse. Yellow skin and eyes (jaundice). Buildup of fluid in the abdomen (ascites). You may notice that your clothes are tight around your waist. Weight gain and swelling of the feet and ankles (edema). Trouble breathing. Easy bruising and bleeding. Vomiting blood, or black or bloody stool. Mental confusion. How is this diagnosed? Your health care provider may suspect cirrhosis based on your symptoms and medical history, especially if you have other medical conditions or a history of alcohol abuse. Your health care provider will do a physical exam to feel your liver and to check for signs of cirrhosis. Tests may include: Blood tests to check: For hepatitis B or C. Kidney function. Liver function. Imaging tests such as: MRI or CT scan to look for changes  seen in advanced cirrhosis. Ultrasound to see if normal liver tissue is being replaced by scar tissue. A procedure in which a long needle is used to take a sample of liver  tissue to be checked in a lab (biopsy). Liver biopsy can confirm the diagnosis of cirrhosis. How is this treated? Treatment for this condition depends on how damaged your liver is and what caused the damage. It may include treating the symptoms of cirrhosis, or treating the underlying causes to slow the damage. Treatment may include: Making lifestyle changes, such as: Eating a healthy diet. You may need to work with your health care provider or a dietitian to develop an eating plan. Restricting salt intake. Maintaining a healthy weight. Not abusing drugs or alcohol. Taking medicines to: Treat liver infections or other infections. Control itching. Reduce fluid buildup. Reduce certain blood toxins. Reduce risk of bleeding from enlarged blood vessels in the stomach or esophagus (varices). Liver transplant. In this procedure, a liver from a donor is used to replace your diseased liver. This is done if cirrhosis has caused liver failure. Other treatments and procedures may be done depending on the problems that you get from cirrhosis. Common problems include liver-related kidney failure (hepatorenal syndrome). Follow these instructions at home:  Take medicines only as told by your health care provider. Do not use medicines that are toxic to your liver. Ask your health care provider before taking any new medicines, including over-the-counter medicines such as NSAIDs. Rest as needed. Eat a well-balanced diet. Limit your salt or water intake, if your health care provider asks you to do this. Do not drink alcohol. This is especially important if you routinely take acetaminophen. Keep all follow-up visits. This is important. Contact a health care provider if you: Have fatigue or weakness that is getting worse. Develop swelling of the hands, feet, or legs, or a buildup of fluid in the abdomen (ascites). Have a fever or chills. Develop loss of appetite. Have nausea or vomiting. Develop  jaundice. Develop easy bruising or bleeding. Get help right away if you: Vomit bright red blood or a material that looks like coffee grounds. Have blood in your stools. Notice that your stools appear black and tarry. Become confused. Have chest pain or trouble breathing. These symptoms may represent a serious problem that is an emergency. Do not wait to see if the symptoms will go away. Get medical help right away. Call your local emergency services (911 in the [place] Do not drive yourself to the hospital. Summary Cirrhosis is chronic liver injury. Common causes are hepatitis C and long-term alcohol abuse. Tests used to diagnose cirrhosis include blood tests, imaging tests, and liver biopsy. Treatment for this condition involves treating the underlying cause. Avoid alcohol, drugs, salt, and medicines that may damage your liver. Get help right away if you vomit bright red blood or a material that looks like coffee grounds. This information is not intended to replace advice given to you by your health care provider. Make sure you discuss any questions you have with your health care provider. Document Revised: 12/30/2019 Document Reviewed: 12/30/2019 Elsevier Patient Education  2022 [employer].

## 2021-04-12 NOTE — Addendum Note (Signed)
Addended by: Orland Mustard on: 04/12/2021 05:08 PM   Modules accepted: Level of Service

## 2021-04-12 NOTE — Telephone Encounter (Signed)
Patient seeing PCP within 2 days of discharge. Qualifies for TCM coding.

## 2021-04-13 ENCOUNTER — Other Ambulatory Visit: Payer: Self-pay

## 2021-04-13 ENCOUNTER — Inpatient Hospital Stay: Payer: Self-pay | Admitting: Internal Medicine

## 2021-04-16 ENCOUNTER — Other Ambulatory Visit: Payer: Self-pay

## 2021-04-23 ENCOUNTER — Other Ambulatory Visit: Payer: Self-pay

## 2021-05-03 ENCOUNTER — Other Ambulatory Visit: Payer: Self-pay

## 2021-05-03 ENCOUNTER — Telehealth: Payer: Self-pay | Admitting: Internal Medicine

## 2021-05-03 ENCOUNTER — Other Ambulatory Visit: Payer: Self-pay | Admitting: Internal Medicine

## 2021-05-03 DIAGNOSIS — L299 Pruritus, unspecified: Secondary | ICD-10-CM

## 2021-05-03 DIAGNOSIS — L409 Psoriasis, unspecified: Secondary | ICD-10-CM

## 2021-05-03 DIAGNOSIS — T148XXA Other injury of unspecified body region, initial encounter: Secondary | ICD-10-CM

## 2021-05-03 MED ORDER — TRIAMCINOLONE ACETONIDE 0.1 % EX OINT
1.0000 "application " | TOPICAL_OINTMENT | Freq: Two times a day (BID) | CUTANEOUS | 3 refills | Status: AC
  Filled 2021-05-03: qty 454, 30d supply, fill #0

## 2021-05-03 MED ORDER — HYDROXYZINE PAMOATE 25 MG PO CAPS
25.0000 mg | ORAL_CAPSULE | Freq: Two times a day (BID) | ORAL | 3 refills | Status: AC | PRN
  Filled 2021-05-03: qty 180, 90d supply, fill #0
  Filled 2021-05-25: qty 60, 30d supply, fill #0
  Filled 2021-08-02: qty 60, 30d supply, fill #1
  Filled 2022-01-28: qty 60, 30d supply, fill #0

## 2021-05-03 MED ORDER — POTASSIUM CHLORIDE ER 20 MEQ PO TBCR
20.0000 meq | EXTENDED_RELEASE_TABLET | Freq: Every day | ORAL | 3 refills | Status: AC
  Filled 2021-05-03: qty 180, fill #0

## 2021-05-03 MED ORDER — MUPIROCIN 2 % EX OINT
1.0000 "application " | TOPICAL_OINTMENT | Freq: Two times a day (BID) | CUTANEOUS | 2 refills | Status: AC
  Filled 2021-05-03 – 2021-05-07 (×2): qty 22, 11d supply, fill #0

## 2021-05-03 NOTE — Telephone Encounter (Signed)
Sent to pharmacy 

## 2021-05-03 NOTE — Telephone Encounter (Signed)
Patient is needing a refill for medications for psoriasis and arthritis. Patient states she is experiencing a really bad flare up and is uncomfortable.  °  °Patient is requesting:  °  °hydrOXYzine (VISTARIL) 25 MG capsule °mupirocin ointment (BACTROBAN) 2 % °potassium chloride (KLOR-CON) 10 MEQ tablet °  °  °  °  °  °  °Patient uses [facility] °

## 2021-05-03 NOTE — Telephone Encounter (Signed)
Patient is needing a refill for medications for psoriasis and arthritis. Patient states she is experiencing a really bad flare up and is uncomfortable.    Patient is requesting:    hydrOXYzine (VISTARIL) 25 MG capsule mupirocin ointment (BACTROBAN) 2 % potassium chloride (KLOR-CON) 10 MEQ tablet             [facility] uses [facility]

## 2021-05-04 ENCOUNTER — Other Ambulatory Visit: Payer: Self-pay

## 2021-05-07 ENCOUNTER — Ambulatory Visit: Payer: Medicaid Other

## 2021-05-07 ENCOUNTER — Other Ambulatory Visit: Payer: Self-pay

## 2021-05-07 NOTE — Telephone Encounter (Signed)
Noted  

## 2021-05-08 ENCOUNTER — Other Ambulatory Visit: Payer: Self-pay

## 2021-05-09 ENCOUNTER — Ambulatory Visit: Payer: Medicaid Other

## 2021-05-09 ENCOUNTER — Encounter: Payer: Medicaid Other | Admitting: Pharmacist

## 2021-05-09 ENCOUNTER — Other Ambulatory Visit: Payer: Self-pay

## 2021-05-10 ENCOUNTER — Ambulatory Visit (INDEPENDENT_AMBULATORY_CARE_PROVIDER_SITE_OTHER): Payer: Medicaid Other | Admitting: Gastroenterology

## 2021-05-10 ENCOUNTER — Other Ambulatory Visit: Payer: Self-pay

## 2021-05-10 ENCOUNTER — Encounter: Payer: Self-pay | Admitting: Gastroenterology

## 2021-05-10 VITALS — BP 166/89 | HR 97 | Temp 98.2°F | Ht 65.0 in | Wt 168.8 lb

## 2021-05-10 DIAGNOSIS — I8501 Esophageal varices with bleeding: Secondary | ICD-10-CM

## 2021-05-10 DIAGNOSIS — K7460 Unspecified cirrhosis of liver: Secondary | ICD-10-CM

## 2021-05-10 MED ORDER — NADOLOL 20 MG PO TABS
20.0000 mg | ORAL_TABLET | Freq: Every day | ORAL | 6 refills | Status: AC
  Filled 2021-05-10 – 2021-05-11 (×2): qty 30, 30d supply, fill #0
  Filled 2021-06-27: qty 30, 30d supply, fill #1
  Filled 2021-08-03: qty 30, 30d supply, fill #2

## 2021-05-10 NOTE — Addendum Note (Signed)
Addended by: Adela Ports on: 05/10/2021 03:23 PM   Modules accepted: Orders

## 2021-05-10 NOTE — Patient Instructions (Signed)
Please come back to our office to have a blood pressure check in two weeks from today.
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
## 2021-05-11 ENCOUNTER — Other Ambulatory Visit: Payer: Self-pay

## 2021-05-14 ENCOUNTER — Other Ambulatory Visit: Payer: Self-pay

## 2021-05-15 NOTE — Progress Notes (Signed)
Celiac test positive- needs EGD and biopsies to confirm   Needs hep A vaccine   Alk phos elevated : get PTH, Ca, vitamin D, GGT, MRCP liver

## 2021-05-16 ENCOUNTER — Ambulatory Visit: Payer: Medicaid Other | Admitting: Anesthesiology

## 2021-05-16 ENCOUNTER — Encounter
Admission: RE | Disposition: A | Discharge status: Home / Self Care | Payer: Self-pay | Source: Home / Self Care | Attending: Gastroenterology

## 2021-05-16 ENCOUNTER — Telehealth: Payer: Self-pay

## 2021-05-16 ENCOUNTER — Encounter: Payer: Self-pay | Admitting: Gastroenterology

## 2021-05-16 ENCOUNTER — Other Ambulatory Visit: Payer: Self-pay

## 2021-05-16 ENCOUNTER — Ambulatory Visit
Admission: RE | Admit: 2021-05-16 | Discharge: 2021-05-16 | Disposition: A | Discharge status: Home / Self Care | Payer: Medicaid Other | Attending: Gastroenterology | Admitting: Gastroenterology

## 2021-05-16 DIAGNOSIS — F419 Anxiety disorder, unspecified: Secondary | ICD-10-CM | POA: Insufficient documentation

## 2021-05-16 DIAGNOSIS — F32A Depression, unspecified: Secondary | ICD-10-CM | POA: Diagnosis not present

## 2021-05-16 DIAGNOSIS — I8500 Esophageal varices without bleeding: Secondary | ICD-10-CM | POA: Diagnosis present

## 2021-05-16 DIAGNOSIS — I110 Hypertensive heart disease with heart failure: Secondary | ICD-10-CM | POA: Diagnosis not present

## 2021-05-16 DIAGNOSIS — K3189 Other diseases of stomach and duodenum: Secondary | ICD-10-CM | POA: Diagnosis not present

## 2021-05-16 DIAGNOSIS — F17210 Nicotine dependence, cigarettes, uncomplicated: Secondary | ICD-10-CM | POA: Insufficient documentation

## 2021-05-16 DIAGNOSIS — G5603 Carpal tunnel syndrome, bilateral upper limbs: Secondary | ICD-10-CM | POA: Insufficient documentation

## 2021-05-16 DIAGNOSIS — M1990 Unspecified osteoarthritis, unspecified site: Secondary | ICD-10-CM | POA: Insufficient documentation

## 2021-05-16 DIAGNOSIS — K766 Portal hypertension: Secondary | ICD-10-CM | POA: Insufficient documentation

## 2021-05-16 DIAGNOSIS — E559 Vitamin D deficiency, unspecified: Secondary | ICD-10-CM | POA: Diagnosis not present

## 2021-05-16 DIAGNOSIS — I8501 Esophageal varices with bleeding: Secondary | ICD-10-CM

## 2021-05-16 DIAGNOSIS — I509 Heart failure, unspecified: Secondary | ICD-10-CM | POA: Diagnosis not present

## 2021-05-16 HISTORY — PX: ESOPHAGOGASTRODUODENOSCOPY (EGD) WITH PROPOFOL: SHX5813

## 2021-05-16 LAB — CBC WITH DIFFERENTIAL/PLATELET
Basophils Absolute: 0.1 10*3/uL (ref 0.0–0.2)
Basos: 2 %
EOS (ABSOLUTE): 0.1 10*3/uL (ref 0.0–0.4)
Eos: 3 %
Hematocrit: 31.2 % — ABNORMAL LOW (ref 34.0–46.6)
Hemoglobin: 10.2 g/dL — ABNORMAL LOW (ref 11.1–15.9)
Immature Grans (Abs): 0 10*3/uL (ref 0.0–0.1)
Immature Granulocytes: 0 %
Lymphocytes Absolute: 1.3 10*3/uL (ref 0.7–3.1)
Lymphs: 31 %
MCH: 29.1 pg (ref 26.6–33.0)
MCHC: 32.7 g/dL (ref 31.5–35.7)
MCV: 89 fL (ref 79–97)
Monocytes Absolute: 0.5 10*3/uL (ref 0.1–0.9)
Monocytes: 13 %
Neutrophils Absolute: 2.1 10*3/uL (ref 1.4–7.0)
Neutrophils: 51 %
Platelets: 269 10*3/uL (ref 150–450)
RBC: 3.5 x10E6/uL — ABNORMAL LOW (ref 3.77–5.28)
RDW: 13.6 % (ref 11.7–15.4)
WBC: 4.1 10*3/uL (ref 3.4–10.8)

## 2021-05-16 LAB — COMPREHENSIVE METABOLIC PANEL
ALT: 12 IU/L (ref 0–32)
AST: 23 IU/L (ref 0–40)
Albumin/Globulin Ratio: 1.1 — ABNORMAL LOW (ref 1.2–2.2)
Albumin: 4.2 g/dL (ref 3.8–4.9)
Alkaline Phosphatase: 236 IU/L — ABNORMAL HIGH (ref 44–121)
BUN/Creatinine Ratio: 19 (ref 9–23)
BUN: 15 mg/dL (ref 6–24)
Bilirubin Total: 0.5 mg/dL (ref 0.0–1.2)
CO2: 22 mmol/L (ref 20–29)
Calcium: 9.7 mg/dL (ref 8.7–10.2)
Chloride: 103 mmol/L (ref 96–106)
Creatinine, Ser: 0.79 mg/dL (ref 0.57–1.00)
Globulin, Total: 3.9 g/dL (ref 1.5–4.5)
Glucose: 87 mg/dL (ref 70–99)
Potassium: 4.2 mmol/L (ref 3.5–5.2)
Sodium: 139 mmol/L (ref 134–144)
Total Protein: 8.1 g/dL (ref 6.0–8.5)
eGFR: 88 mL/min/{1.73_m2} (ref >59)

## 2021-05-16 LAB — CELIAC DISEASE AB SCREEN W/RFX
Antigliadin Abs, IgA: 23 units — ABNORMAL HIGH (ref 0–19)
Transglutaminase IgA: 6 U/mL — ABNORMAL HIGH (ref 0–3)

## 2021-05-16 LAB — ENDOMYSIAL IGA ANTIBODY: Endomysial IgA: NEGATIVE

## 2021-05-16 LAB — HEPATITIS B SURFACE ANTIGEN: Hepatitis B Surface Ag: NEGATIVE

## 2021-05-16 LAB — IMMUNOGLOBULINS A/E/G/M, SERUM
IgA/Immunoglobulin A, Serum: 499 mg/dL — ABNORMAL HIGH (ref 87–352)
IgE (Immunoglobulin E), Serum: 229 IU/mL (ref 6–495)
IgG (Immunoglobin G), Serum: 2247 mg/dL — ABNORMAL HIGH (ref 586–1602)
IgM (Immunoglobulin M), Srm: 191 mg/dL (ref 26–217)

## 2021-05-16 LAB — HEPATITIS C ANTIBODY: Hep C Virus Ab: 0.1 s/co ratio (ref 0.0–0.9)

## 2021-05-16 LAB — HEPATITIS A ANTIBODY, TOTAL: hep A Total Ab: NEGATIVE

## 2021-05-16 LAB — ALPHA-1-ANTITRYPSIN: A-1 Antitrypsin: 190 mg/dL — ABNORMAL HIGH (ref 101–187)

## 2021-05-16 LAB — MITOCHONDRIAL/SMOOTH MUSCLE AB PNL
Mitochondrial Ab: <20 Units (ref 0.0–20.0)
Smooth Muscle Ab: <1 Units (ref 0–19)

## 2021-05-16 LAB — HIV ANTIBODY (ROUTINE TESTING W REFLEX): HIV Screen 4th Generation wRfx: NONREACTIVE

## 2021-05-16 LAB — IRON,TIBC AND FERRITIN PANEL
Ferritin: 18 ng/mL (ref 15–150)
Iron Saturation: 21 % (ref 15–55)
Iron: 110 ug/dL (ref 27–159)
Total Iron Binding Capacity: 530 ug/dL — ABNORMAL HIGH (ref 250–450)
UIBC: 420 ug/dL (ref 131–425)

## 2021-05-16 LAB — HEPATITIS B SURFACE ANTIBODY,QUALITATIVE: Hep B Surface Ab, Qual: REACTIVE

## 2021-05-16 LAB — HEPATITIS B E ANTIGEN: Hep B E Ag: NEGATIVE

## 2021-05-16 LAB — ANA: Anti Nuclear Antibody (ANA): NEGATIVE

## 2021-05-16 LAB — PROTIME-INR
INR: 1.4 — ABNORMAL HIGH (ref 0.9–1.2)
Prothrombin Time: 15 s — ABNORMAL HIGH (ref 9.1–12.0)

## 2021-05-16 LAB — ANTI-MICROSOMAL ANTIBODY LIVER / KIDNEY: LKM1 Ab: 1.6 Units (ref 0.0–20.0)

## 2021-05-16 LAB — HEPATITIS B E ANTIBODY: Hep B E Ab: NEGATIVE

## 2021-05-16 LAB — HEPATITIS B CORE ANTIBODY, TOTAL: Hep B Core Total Ab: NEGATIVE

## 2021-05-16 LAB — CERULOPLASMIN: Ceruloplasmin: 37.1 mg/dL (ref 19.0–39.0)

## 2021-05-16 LAB — CK: Total CK: 64 U/L (ref 32–182)

## 2021-05-16 SURGERY — ESOPHAGOGASTRODUODENOSCOPY (EGD) WITH PROPOFOL
Anesthesia: General

## 2021-05-16 MED ORDER — LIDOCAINE HCL (PF) 2 % IJ SOLN
INTRAMUSCULAR | Status: AC
  Filled 2021-05-16: qty 5

## 2021-05-16 MED ORDER — PROPOFOL 10 MG/ML IV BOLUS
INTRAVENOUS | Status: DC | PRN
  Administered 2021-05-16: 300 mg via INTRAVENOUS

## 2021-05-16 MED ORDER — PROPOFOL 10 MG/ML IV BOLUS
INTRAVENOUS | Status: AC
  Filled 2021-05-16: qty 40

## 2021-05-16 MED ORDER — LIDOCAINE HCL (CARDIAC) PF 100 MG/5ML IV SOSY
PREFILLED_SYRINGE | INTRAVENOUS | Status: DC | PRN
  Administered 2021-05-16: 100 mg via INTRAVENOUS

## 2021-05-16 MED ORDER — SODIUM CHLORIDE 0.9 % IV SOLN: INTRAVENOUS | Status: DC

## 2021-05-16 MED ORDER — DEXAMETHASONE SODIUM PHOSPHATE 10 MG/ML IJ SOLN
INTRAMUSCULAR | Status: DC | PRN
  Administered 2021-05-16: 10 mg via INTRAVENOUS

## 2021-05-16 NOTE — Anesthesia Preprocedure Evaluation (Signed)
Anesthesia Evaluation  Patient identified by MRN, date of birth, ID band Patient awake    Reviewed: Allergy & Precautions, NPO status , Patient's Chart, lab work & pertinent test results  History of Anesthesia Complications Negative for: history of anesthetic complications  Airway Mallampati: II  TM Distance: >3 FB Neck ROM: Full    Dental no notable dental hx.    Pulmonary neg shortness of breath, neg COPD, neg recent URI, Current Smoker and Patient abstained from smoking.,    Pulmonary exam normal breath sounds clear to auscultation       Cardiovascular Exercise Tolerance: Good METS: 3 - Mets hypertension, Pt. on medications (-) angina(-) Past MI, (-) Cardiac Stents and (-) CHF negative cardio ROS Normal cardiovascular exam(-) dysrhythmias (-) Valvular Problems/Murmurs Rhythm:Regular Rate:Normal     Neuro/Psych neg Seizures PSYCHIATRIC DISORDERS Anxiety Depression  Neuromuscular disease (Bilat CTS)    GI/Hepatic negative GI ROS, Neg liver ROS,   Endo/Other  negative endocrine ROS  Renal/GU negative Renal ROS  negative genitourinary   Musculoskeletal  (+) Arthritis ,   Abdominal   Peds  Hematology negative hematology ROS (+) Blood dyscrasia, anemia , Upper GI bleed   Anesthesia Other Findings Past Medical History: No date: Anemia     Comment:  with PICA sx's  No date: Anxiety No date: Bacterial vaginosis No date: CHF (congestive heart failure) (HCC) No date: Chicken pox No date: Depression No date: HSV (herpes simplex virus) infection No date: Hypertension No date: Vitamin D deficiency   Reproductive/Obstetrics negative OB ROS                             Anesthesia Physical  Anesthesia Plan  ASA: 2  Anesthesia Plan: General   Post-op Pain Management:    Induction: Intravenous  PONV Risk Score and Plan: 3 and Propofol infusion and TIVA  Airway Management Planned:  Natural Airway and Nasal Cannula  Additional Equipment:   Intra-op Plan:   Post-operative Plan:   Informed Consent: I have reviewed the patients History and Physical, chart, labs and discussed the procedure including the risks, benefits and alternatives for the proposed anesthesia with the patient or authorized representative who has indicated his/her understanding and acceptance.     Dental Advisory Given  Plan Discussed with: Anesthesiologist, CRNA and Surgeon  Anesthesia Plan Comments: (Patient consented for risks of anesthesia including but not limited to:  - adverse reactions to medications - damage to eyes, teeth, lips or other oral mucosa - nerve damage due to positioning  - sore throat or hoarseness - Damage to heart, brain, nerves, lungs, other parts of body or loss of life  Patient voiced understanding.)        Anesthesia Quick Evaluation

## 2021-05-16 NOTE — H&P (Signed)
Shelly Mood, MD [address], [place], [place], [address] [address], [place], [place], [address] Phone: [phone]  Fax: [phone]  Primary Care Physician:  McLean-Scocuzza, Pasty Spillers, MD   Pre-Procedure History & Physical: HPI:  Shelly Stevenson is a 56 y.o. female is here for an endoscopy    Past Medical History:  Diagnosis Date   Anemia    with PICA sx's    Anxiety    Bacterial vaginosis    CHF (congestive heart failure) (HCC)    Chicken pox    Depression    HSV (herpes simplex virus) infection    Hypertension    Vitamin D deficiency     Past Surgical History:  Procedure Laterality Date   APPENDECTOMY     56 y.o.    AUGMENTATION MAMMAPLASTY Bilateral    COLONOSCOPY WITH PROPOFOL N/A 05/02/2017   Procedure: COLONOSCOPY WITH PROPOFOL;  Surgeon: Pasty Spillers, MD;  Location: [facility] END[facility]COPY;  Service: Endoscopy;  Laterality: N/A;   ESOPHAGOGASTRODUODENOSCOPY (EGD) WITH PROPOFOL N/A 04/08/2021   Procedure: ESOPHAGOGASTRODUODENOSCOPY (EGD) WITH PROPOFOL;  Surgeon: Shelly Mood, MD;  Location: [facility] ENDOSCOPY;  Service: Gastroenterology;  Laterality: N/A;   FRACTURE SURGERY     Left thumb    Prior to Admission medications   Medication Sig Start Date End Date Taking? Authorizing Provider  furosemide (LASIX) 20 MG tablet Take 1 tablet (20 mg total) by mouth daily as needed. (Abdominal bloating/leg swelling) 04/12/21  Yes McLean-Scocuzza, Pasty Spillers, MD  hydrOXYzine (VISTARIL) 25 MG capsule Take 1 capsule (25 mg total) by mouth 2 (two) times daily as needed. 05/03/21  Yes McLean-Scocuzza, Pasty Spillers, MD  mupirocin ointment (BACTROBAN) 2 % Apply 1 application topically 2 (two) times daily. (Face wound and leg wound) 05/03/21  Yes McLean-Scocuzza, Pasty Spillers, MD  nadolol (CORGARD) 20 MG tablet Take 1 tablet (20 mg total) by mouth once daily. 05/10/21  Yes Shelly Mood, MD  triamcinolone ointment (KENALOG) 0.1 % Apply 1 application topically 2 (two) times daily as  needed. Avoid the face, underarms, and private area. 05/03/21  Yes McLean-Scocuzza, Pasty Spillers, MD  Vitamin D, Ergocalciferol, (DRISDOL) 1.25 MG (50000 UNIT) CAPS capsule Take 1 capsule (50,000 Units total) by mouth once a week for 6 months. 04/12/21  Yes McLean-Scocuzza, Pasty Spillers, MD  zolpidem (AMBIEN CR) 6.25 MG CR tablet Take 1 tablet (6.25 mg total) by mouth at bedtime as needed for sleep. 10/08/20  Yes Arnaldo Natal, MD  ALPRAZolam Prudy Feeler) 0.5 MG tablet Take 0.5 mg by mouth daily as needed for anxiety. Patient not taking: Reported on 05/16/2021    [provider]  Potassium Chloride ER 20 MEQ TBCR Take 1 tablet (20 mEq total) by mouth once daily. 05/03/21   McLean-Scocuzza, Pasty Spillers, MD  venlafaxine XR (EFFEXOR-XR) 150 MG 24 hr capsule Take 1 capsule (150 mg total) by mouth daily. 04/28/20   McLean-Scocuzza, Pasty Spillers, MD    Allergies as of 05/10/2021 - Review Complete 05/10/2021  Allergen Reaction Noted   Penicillins Swelling 09/28/2016   Codeine Rash 03/24/2017    Family History  Problem Relation Age of Onset   Arthritis Mother    Cancer Mother        breast   Hearing loss Mother    Hypertension Mother    Breast cancer Mother 69   Arthritis Father    Cancer Father        colon dx'ed age 24s    Hypertension Father  Heart disease Father        MI   Diabetes Father    Mental illness Son        [facility]    Social History   Socioeconomic History   Marital status: Divorced    Spouse name: Not on file   Number of children: Not on file   Years of education: Not on file   Highest education level: Not on file  Occupational History   Not on file  Tobacco Use   Smoking status: Every Day    Packs/day: 0.50    Types: Cigarettes   Smokeless tobacco: Never   Tobacco comments:    <1ppd x 30 years no FH lung cancer   Vaping Use   Vaping Use: Never used  Substance and Sexual Activity   Alcohol use: Yes    Comment: occ   Drug use: Not Currently   Sexual activity: Yes     Comment: men  Other Topics Concern   Not on file  Social History Narrative   Works in [employer] [place] 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 [place]    Long term boyfriend       12 grade education    Enjoys yard work    1 Administrator   3 kids 2 boys and 1 girl and 1 grandson [employer]    Social Determinants of Corporate investment banker Strain: Not on [employer] Insecurity: Not on file  Transportation Needs: Not on file  Physical Activity: Not on file  Stress: Not on file  Social Connections: Not on file  Intimate Partner Violence: Not on file    Review of Systems: See HPI, otherwise negative ROS  Physical Exam: BP (!) 134/100    Pulse 82    Temp (!) 96.6 F (35.9 C) (Temporal)    Resp 20    Ht 5\' 5"  (1.651 m)    Wt 74.8 kg    SpO2 99%    BMI 27.46 kg/m  General:   Alert,  pleasant and cooperative in NAD Head:  Normocephalic and atraumatic. Neck:  Supple; no masses or thyromegaly. Lungs:  Clear throughout to auscultation, normal respiratory effort.    Heart:  +S1, +S2, Regular rate and rhythm, No edema. Abdomen:  Soft, nontender and nondistended. Normal bowel sounds, without guarding, and without rebound.   Neurologic:  Alert and  oriented x4;  grossly normal neurologically.  Impression/Plan: Shelly Stevenson is here for an endoscopy  to be performed for  evaluation of esophageal varices    Risks, benefits, limitations, and alternatives regarding endoscopy have been reviewed with the patient.  Questions have been answered.  All parties agreeable.   Marisa Hua, MD  05/16/2021, 9:07 AM

## 2021-05-16 NOTE — Telephone Encounter (Signed)
Dr. Tobi Bastos sent me a secure chat to let me know that he wanted me to get in contact with the patient so I could schedule her an EGD in about 4 weeks to check if her esophageal varices have healed. I called patient but had to leave her a detail message to call back so I could schedule her an EGD. Awaiting on her call.

## 2021-05-16 NOTE — Transfer of Care (Signed)
Immediate Anesthesia Transfer of Care Note  Patient: Marisa Hua  Procedure(s) Performed: ESOPHAGOGASTRODUODENOSCOPY (EGD) WITH PROPOFOL  Patient Location: Endoscopy Unit  Anesthesia Type:General  Level of Consciousness: awake  Airway & Oxygen Therapy: Patient Spontanous Breathing and Patient connected to face mask  Post-op Assessment: Report given to RN and Post -op Vital signs reviewed and stable  Post vital signs: Reviewed and stable  Last Vitals:  Vitals Value Taken Time  BP 126/84 05/16/21 0934  Temp    Pulse 83 05/16/21 0935  Resp 16 05/16/21 0935  SpO2 94 % 05/16/21 0935  Vitals shown include unvalidated device data.  Last Pain:  Vitals:   05/16/21 0830  TempSrc: Temporal  PainSc: 0-No pain         Complications: No notable events documented.

## 2021-05-16 NOTE — Anesthesia Postprocedure Evaluation (Signed)
Anesthesia Post Note  Patient: Oretha Caprice  Procedure(s) Performed: ESOPHAGOGASTRODUODENOSCOPY (EGD) WITH PROPOFOL  Patient location during evaluation: Endoscopy Anesthesia Type: General Level of consciousness: awake and alert Pain management: pain level controlled Vital Signs Assessment: post-procedure vital signs reviewed and stable Respiratory status: spontaneous breathing, nonlabored ventilation, respiratory function stable and patient connected to nasal cannula oxygen Cardiovascular status: blood pressure returned to baseline and stable Postop Assessment: no apparent nausea or vomiting Anesthetic complications: no   No notable events documented.   Last Vitals:  Vitals:   05/16/21 0934 05/16/21 0940  BP: 126/84 135/90  Pulse: 87 84  Resp: 20 (!) 26  Temp:    SpO2: 93% 92%    Last Pain:  Vitals:   05/16/21 0930  TempSrc: Temporal  PainSc:                  Martha Clan

## 2021-05-16 NOTE — Op Note (Signed)
[facility] Gastroenterology Patient Name: Shelly Stevenson Procedure Date: 05/16/2021 9:08 AM MRN: [id number] Account #: [id number] Date of Birth: 03-28-1966 Admit Type: Outpatient Age: 56 Room: [facility] ENDO ROOM 3 Gender: Female Note Status: Finalized Instrument Name: Upper Endoscope [id number] Procedure:             Upper GI endoscopy Indications:           Follow-up of esophageal varices Providers:             [name] MD, MD Medicines:             Monitored Anesthesia Care Complications:         No immediate complications. Procedure:             Pre-Anesthesia Assessment:                        - Prior to the procedure, a History and Physical was                         performed, and patient medications, allergies and                         sensitivities were reviewed. The patient's tolerance                         of previous anesthesia was reviewed.                        - The risks and benefits of the procedure and the                         sedation options and risks were discussed with the                         patient. All questions were answered and informed                         consent was obtained.                        - ASA Grade Assessment: III - A patient with severe                         systemic disease.                        After obtaining informed consent, the endoscope was                         passed under direct vision. Throughout the procedure,                         the patient's blood pressure, pulse, and oxygen                         saturations were monitored continuously. The Endoscope                         was introduced through the mouth, and advanced to the  third part of duodenum. The upper GI endoscopy was                         accomplished with ease. The patient tolerated the                         procedure well. Findings:      Grade III varices were found in the lower third of the  esophagus. They       were medium in size. Two bands were successfully placed with complete       eradication, resulting in deflation of varices. There was no bleeding       during and at the end of the procedure.      Moderate portal hypertensive gastropathy was found on the greater       curvature of the stomach.      The examined duodenum was normal. Impression:            - Grade III esophageal varices. Completely eradicated.                         Banded.                        - Portal hypertensive gastropathy.                        - Normal examined duodenum.                        - No specimens collected. Recommendation:        - Discharge patient to home (with escort).                        - Resume previous diet.                        - Continue present medications.                        - Repeat upper endoscopy in 4 weeks for surveillance. Procedure Code(s):     --- Professional ---                        [phone], Esophagogastroduodenoscopy, flexible,                         transoral; with band ligation of esophageal/gastric                         varices Diagnosis Code(s):     --- Professional ---                        I85.00, Esophageal varices without bleeding                        K76.6, Portal hypertension                        K31.89, Other diseases of stomach and duodenum CPT copyright 2019 American Medical Association. All rights reserved. The codes documented in this report are preliminary and upon coder review may  be  revised to meet current compliance requirements. [name], MD [name] MD, MD 05/16/2021 9:27:07 AM This report has been signed electronically. Number of Addenda: 0 Note Initiated On: 05/16/2021 9:08 AM Estimated Blood Loss:  Estimated blood loss: none.      [facility]

## 2021-05-17 NOTE — Telephone Encounter (Signed)
Called patient again and had to leave her a detailed message to call us back to schedule her another EGD in a month for surveillance.

## 2021-05-18 ENCOUNTER — Other Ambulatory Visit: Payer: Self-pay

## 2021-05-18 ENCOUNTER — Ambulatory Visit: Payer: Medicaid Other | Admitting: Pharmacy Technician

## 2021-05-18 DIAGNOSIS — R748 Abnormal levels of other serum enzymes: Secondary | ICD-10-CM

## 2021-05-18 DIAGNOSIS — Z79899 Other long term (current) drug therapy: Secondary | ICD-10-CM

## 2021-05-18 NOTE — Progress Notes (Signed)
Met with patient completed financial assistance application for [place] due to recent hospital visit.  Patient requested that financial information be forwarded to the Patient Billing department in [facility].    Completed [facility] application and contract.  Patient agreed to all terms of the [facility] contract.    Patient approved for medication assistance at [facility] until time for re-certification in 9914, and as long as eligibility criteria continues to be met.    Provided patient with community resource material based on her particular needs.    [facility]

## 2021-05-18 NOTE — Telephone Encounter (Signed)
Called patient again and she did not answer. I left her another voicemail letting her know that we needed to speak to her in reference to her labs. I will go ahead and send her a MyChart message too and hopefully she gets to read it.

## 2021-05-21 ENCOUNTER — Other Ambulatory Visit: Payer: Self-pay

## 2021-05-24 ENCOUNTER — Ambulatory Visit: Payer: Medicaid Other

## 2021-05-24 ENCOUNTER — Telehealth: Payer: Self-pay

## 2021-05-24 NOTE — Telephone Encounter (Signed)
Called to check on patient no answer left voicemail patient not here when we went out to get her

## 2021-05-25 ENCOUNTER — Other Ambulatory Visit: Payer: Self-pay

## 2021-05-28 ENCOUNTER — Other Ambulatory Visit: Payer: Self-pay

## 2021-05-29 ENCOUNTER — Ambulatory Visit: Payer: Medicaid Other

## 2021-06-06 ENCOUNTER — Other Ambulatory Visit: Payer: Self-pay

## 2021-06-06 MED ORDER — DOXEPIN HCL 10 MG PO CAPS
ORAL_CAPSULE | Freq: Every evening | ORAL | 1 refills | Status: DC
  Filled 2021-06-06: qty 60, 30d supply, fill #0

## 2021-06-07 ENCOUNTER — Other Ambulatory Visit: Payer: Self-pay

## 2021-06-07 MED ORDER — DOXEPIN HCL 10 MG PO CAPS
ORAL_CAPSULE | Freq: Every evening | ORAL | 1 refills | Status: AC
  Filled 2021-08-03: qty 60, 30d supply, fill #0

## 2021-06-12 ENCOUNTER — Ambulatory Visit: Payer: Self-pay | Admitting: Internal Medicine

## 2021-06-12 ENCOUNTER — Encounter: Payer: Self-pay | Admitting: Internal Medicine

## 2021-06-27 ENCOUNTER — Other Ambulatory Visit: Payer: Self-pay

## 2021-07-31 ENCOUNTER — Ambulatory Visit: Payer: Medicaid Other

## 2021-08-02 ENCOUNTER — Other Ambulatory Visit: Payer: Self-pay

## 2021-08-03 ENCOUNTER — Other Ambulatory Visit: Payer: Self-pay

## 2021-08-06 ENCOUNTER — Other Ambulatory Visit: Payer: Self-pay | Admitting: Internal Medicine

## 2021-08-06 ENCOUNTER — Other Ambulatory Visit: Payer: Self-pay

## 2021-08-06 ENCOUNTER — Telehealth: Payer: Self-pay | Admitting: Internal Medicine

## 2021-08-06 DIAGNOSIS — F419 Anxiety disorder, unspecified: Secondary | ICD-10-CM

## 2021-08-06 MED ORDER — VENLAFAXINE HCL ER 150 MG PO CP24: 150.0000 mg | ORAL_CAPSULE | Freq: Every day | ORAL | 3 refills | Status: AC

## 2021-08-06 NOTE — Telephone Encounter (Signed)
Called pt to inform that effexor xr has been sent to pharmacy, and to inform pt that effexor xr to [facility] Dr.Tracy rec pt to  establish with psychiatry and therapy. Unable to reach due to operator stating that call cannot be completed as dialed.  ?Given list previously or she can call on back of her insurance card who accepts and let me know ?

## 2021-08-06 NOTE — Telephone Encounter (Signed)
Patient is requesting a refill on her venlafaxine XR (EFFEXOR-XR) 150 MG 24 hr capsule. 

## 2021-08-06 NOTE — Telephone Encounter (Signed)
Filled effexor xr to [facility] [employer] ?also rec pt establish with psychiatry and therapy  ?Given list previously or she can call on back of her insurance card who accepts and let me know ?

## 2021-08-07 ENCOUNTER — Encounter: Payer: Self-pay | Admitting: Gastroenterology

## 2021-08-07 ENCOUNTER — Ambulatory Visit: Payer: Medicaid Other | Admitting: Gastroenterology
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
## 2021-08-07 NOTE — Telephone Encounter (Signed)
Called pt x2 to inform that effexor xr has been sent to pharmacy, Dr.Tracy rec pt to establish with psychiatry and therapy. Given list previously or she can call on back of her insurance card who accepts and let me know. Unable to reach due to operator stating that call cannot be completed as dialed x2 ?

## 2021-08-08 NOTE — Telephone Encounter (Signed)
Called pt x3 to inform that effexor xr has been sent to pharmacy, Dr.Tracy rec pt to establish with psychiatry and therapy. Given list previously or she can call on back of her insurance card who accepts and let me know. Unable to reach due to operator stating that call cannot be completed as dialed x3. Will be mailing letter to notify pt that rx was sent to pharmacy and to establish with psychiatry and therapy.  ?

## 2021-08-15 ENCOUNTER — Other Ambulatory Visit: Payer: Self-pay

## 2021-11-19 DIAGNOSIS — F1520 Other stimulant dependence, uncomplicated: Secondary | ICD-10-CM | POA: Diagnosis not present

## 2021-11-19 DIAGNOSIS — R69 Illness, unspecified: Secondary | ICD-10-CM | POA: Diagnosis not present

## 2021-11-22 DIAGNOSIS — F1520 Other stimulant dependence, uncomplicated: Secondary | ICD-10-CM | POA: Diagnosis not present

## 2021-11-22 DIAGNOSIS — F1120 Opioid dependence, uncomplicated: Secondary | ICD-10-CM | POA: Diagnosis not present

## 2021-11-30 DIAGNOSIS — F1120 Opioid dependence, uncomplicated: Secondary | ICD-10-CM | POA: Diagnosis not present

## 2021-12-07 DIAGNOSIS — Z880 Allergy status to penicillin: Secondary | ICD-10-CM | POA: Diagnosis not present

## 2021-12-07 DIAGNOSIS — L989 Disorder of the skin and subcutaneous tissue, unspecified: Secondary | ICD-10-CM | POA: Diagnosis not present

## 2021-12-18 DIAGNOSIS — F1120 Opioid dependence, uncomplicated: Secondary | ICD-10-CM | POA: Diagnosis not present

## 2022-01-11 IMAGING — CT CT ANGIO CHEST
2 of 6 series · 19 of 46 positions shown · IV contrast (APPLIED)
Comparison: chest radiograph April 24, 2020

CLINICAL DATA: Chronic shortness of breath and generalized chest
pain, worsening today.

EXAM:
CT ANGIOGRAPHY CHEST WITH CONTRAST
TECHNIQUE: Multidetector CT imaging of the chest was performed using the
standard protocol during bolus administration of intravenous
contrast. Multiplanar CT image reconstructions and MIPs were
obtained to evaluate the vascular anatomy.
CONTRAST:  100mL OMNIPAQUE IOHEXOL 350 MG/ML SOLN

[Series 5: thins · axial · 0.60mm/px · z∈[-628,-403]mm · 16 of 307 slices shown]
[im 13/307  lung]
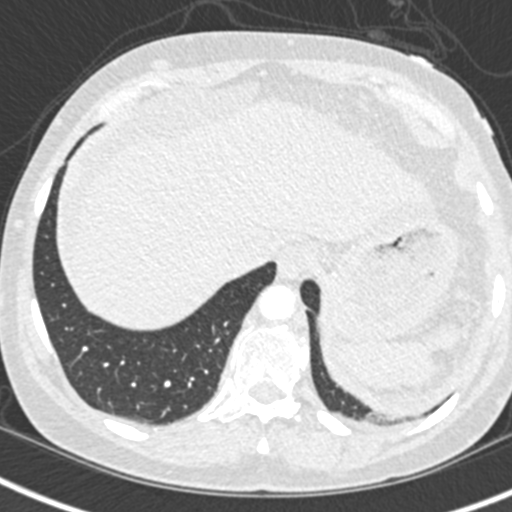
[im 39/307  soft-tissue]
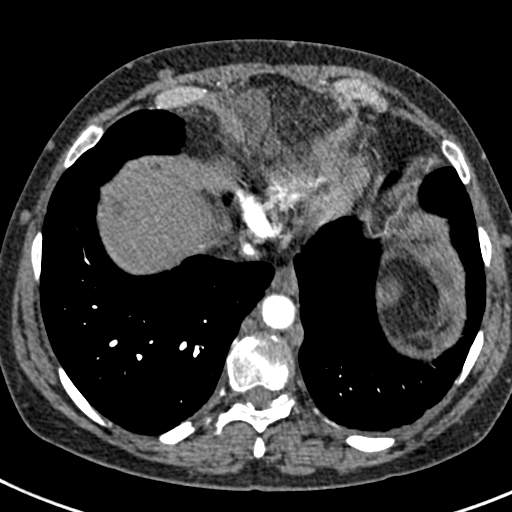
[im 52/307  lung]
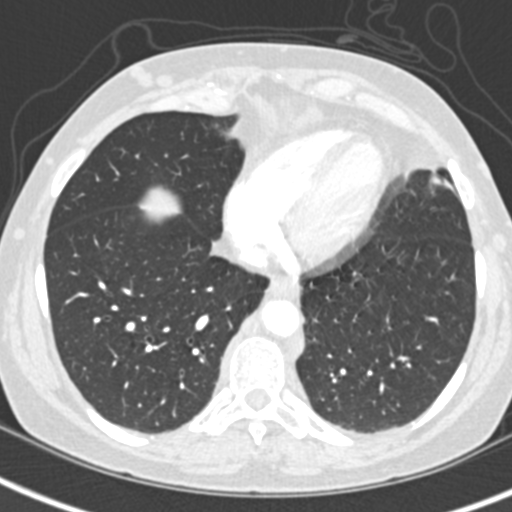
[im 77/307  soft-tissue]
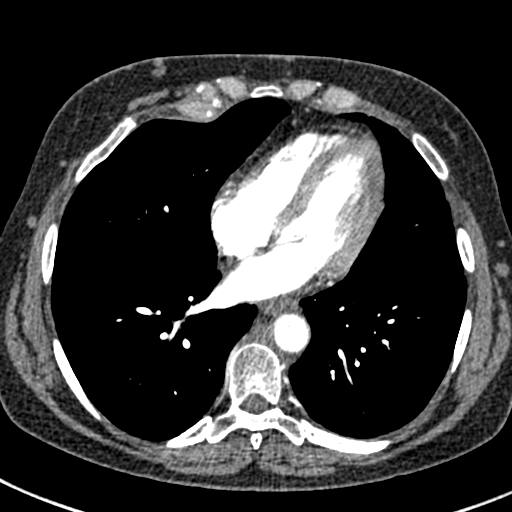
[im 90/307  lung]
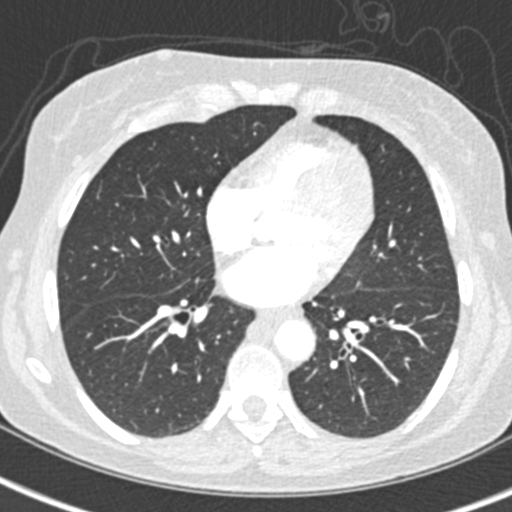
[im 103/307  soft-tissue]
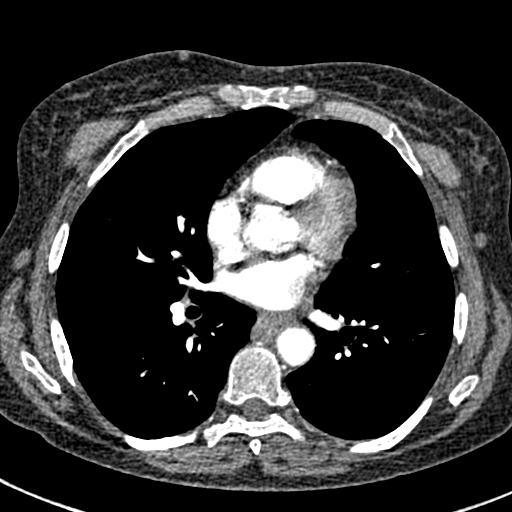
[im 128/307  lung]
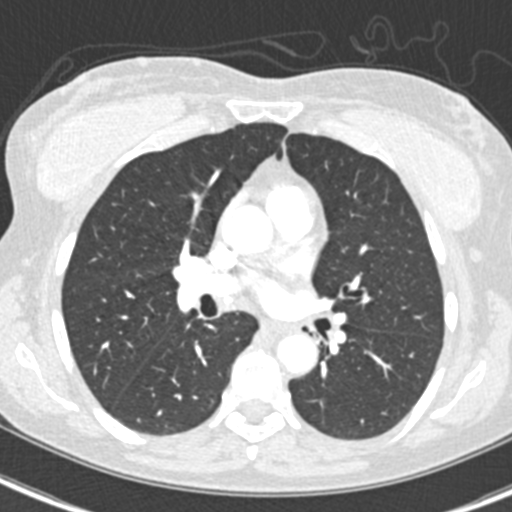
[im 141/307  soft-tissue]
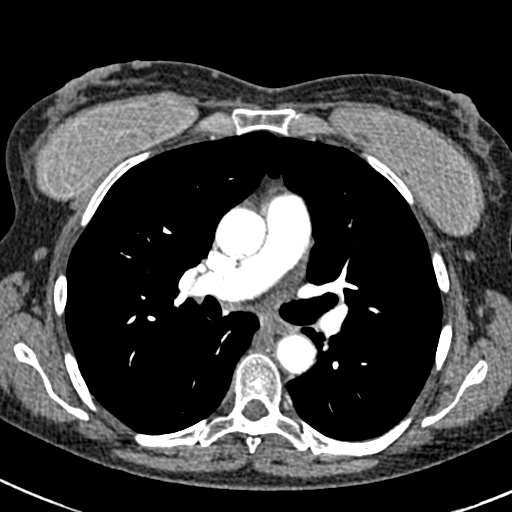
[im 166/307  lung]
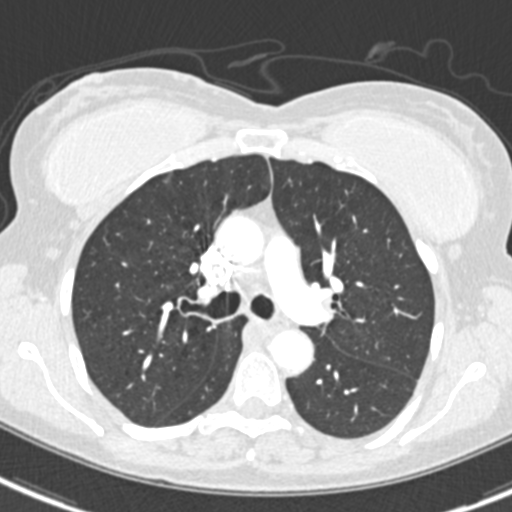
[im 179/307  soft-tissue]
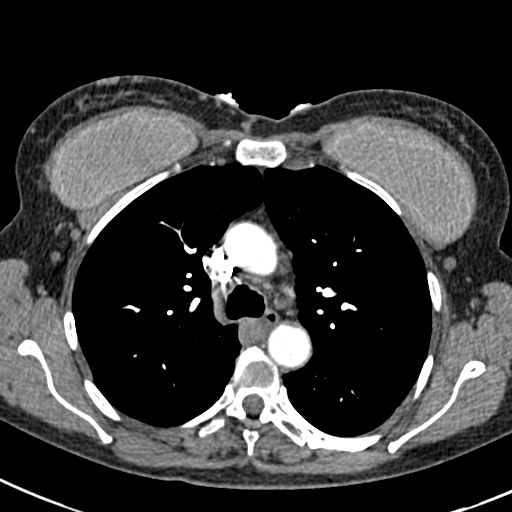
[im 205/307  lung]
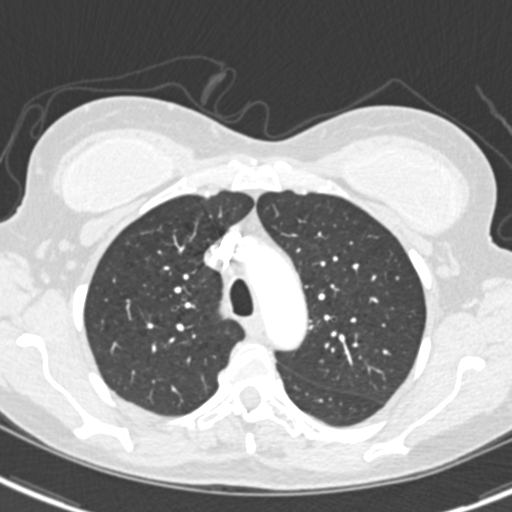
[im 217/307  soft-tissue]
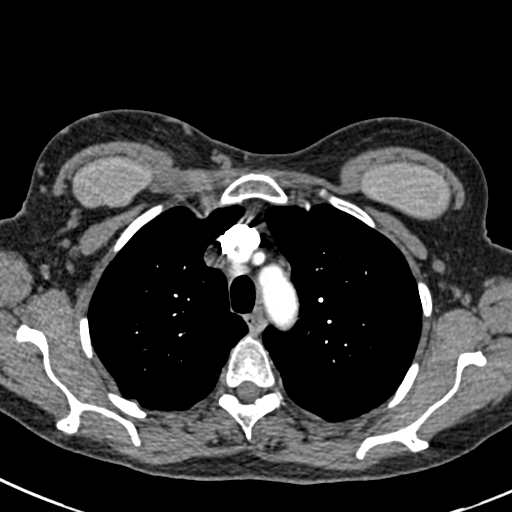
[im 230/307  lung]
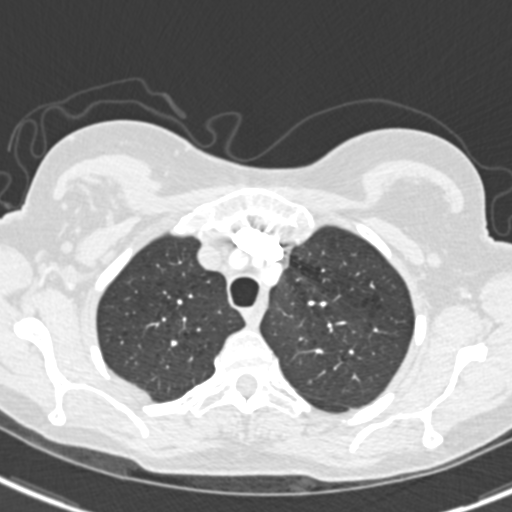
[im 256/307  soft-tissue]
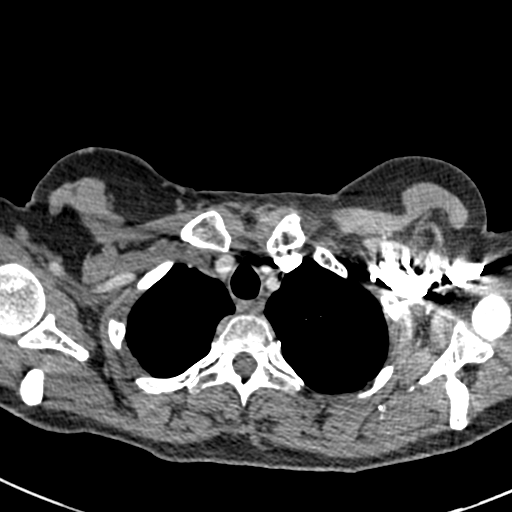
[im 268/307  lung]
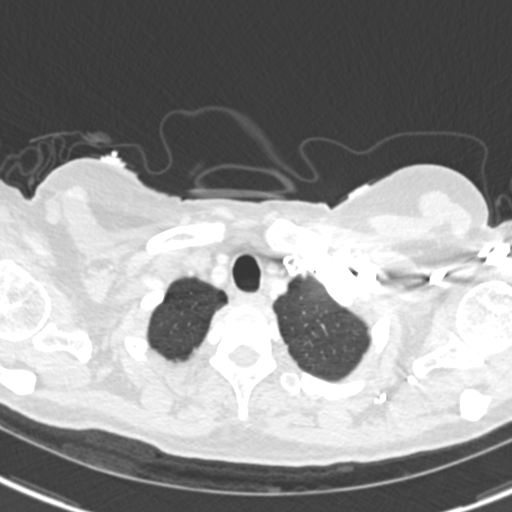
[im 294/307  soft-tissue]
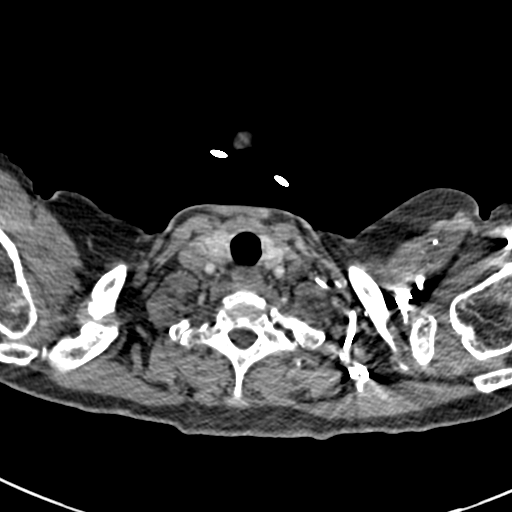

[Series 7: coronal mpr · coronal · 0.54mm/px · 3 of 106 slices shown]
[im 27/106  soft-tissue]
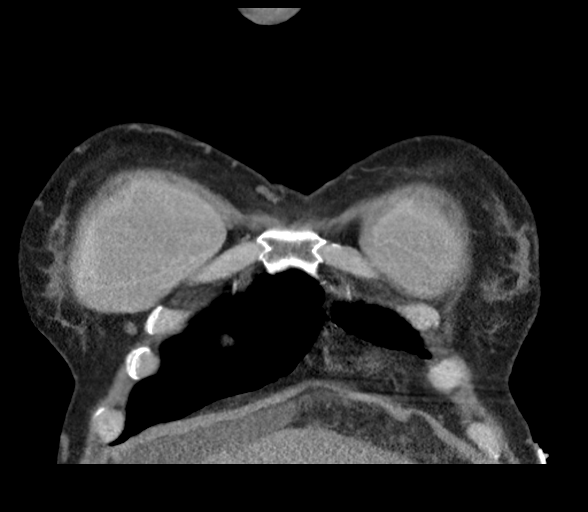
[im 53/106  soft-tissue]
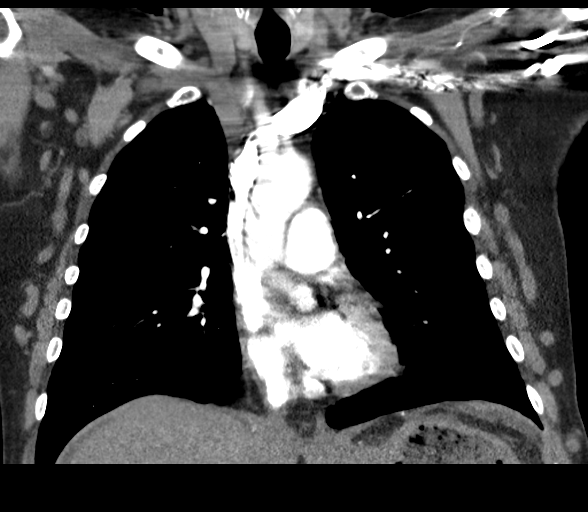
[im 79/106  soft-tissue]
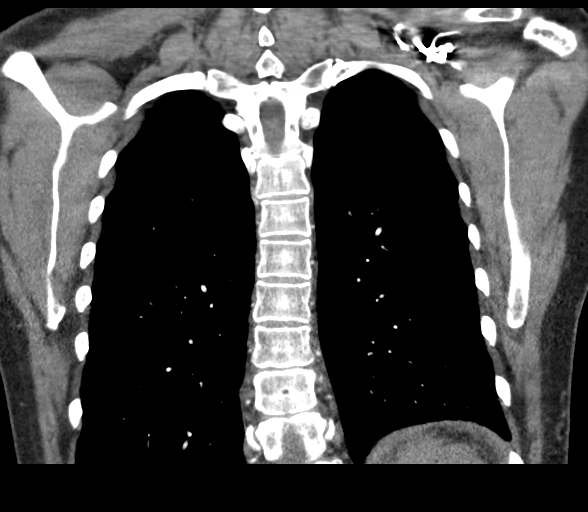

[19 of 46 positions shown; findings below may reference images not displayed]

FINDINGS: Cardiovascular: Satisfactory opacification of the pulmonary arteries
to the segmental level. No evidence of pulmonary embolism. Normal
caliber central pulmonary vessels. No thoracic aortic aneurysm.
Normal heart size. No pericardial effusion. No suspicious
intracardiac filling defect.

Mediastinum/Nodes: No suspicious thyroid nodule. No pathologically
enlarged mediastinal, hilar or axillary lymph nodes. The trachea
esophagus are grossly unremarkable.

Lungs/Pleura: Mild emphysematous change. Lingular and left lower
lobe atelectasis. Right lung base is excluded by collimation. No
suspicious pulmonary nodules or masses. No pleural effusion. No
pneumothorax.

Upper Abdomen: Small volume upper abdominal ascites.

Musculoskeletal: No acute osseous abnormality.

Review of the MIP images confirms the above findings.
IMPRESSION: 1. No evidence of pulmonary embolism.
2. No acute cardiopulmonary disease.
3. Small volume upper abdominal ascites.
4. Emphysema ([id number]).

## 2022-01-28 ENCOUNTER — Other Ambulatory Visit: Payer: Self-pay

## 2022-04-17 ENCOUNTER — Inpatient Hospital Stay: Payer: Medicaid Other | Admitting: Nurse Practitioner

## 2022-07-11 IMAGING — CR DG ABDOMEN ACUTE W/ 1V CHEST
1 series · 4 of 4 positions shown · non-contrast
Comparison: Chest radiograph 10/08/2020

CLINICAL DATA: Abdominal pain.  Coffee-ground emesis.

EXAM:
DG ABDOMEN ACUTE WITH 1 VIEW CHEST

[Series 1: dg abd acute 2+v w 1v chest · 0.14mm/px · 4 of 4 slices shown]
[im 1/4]
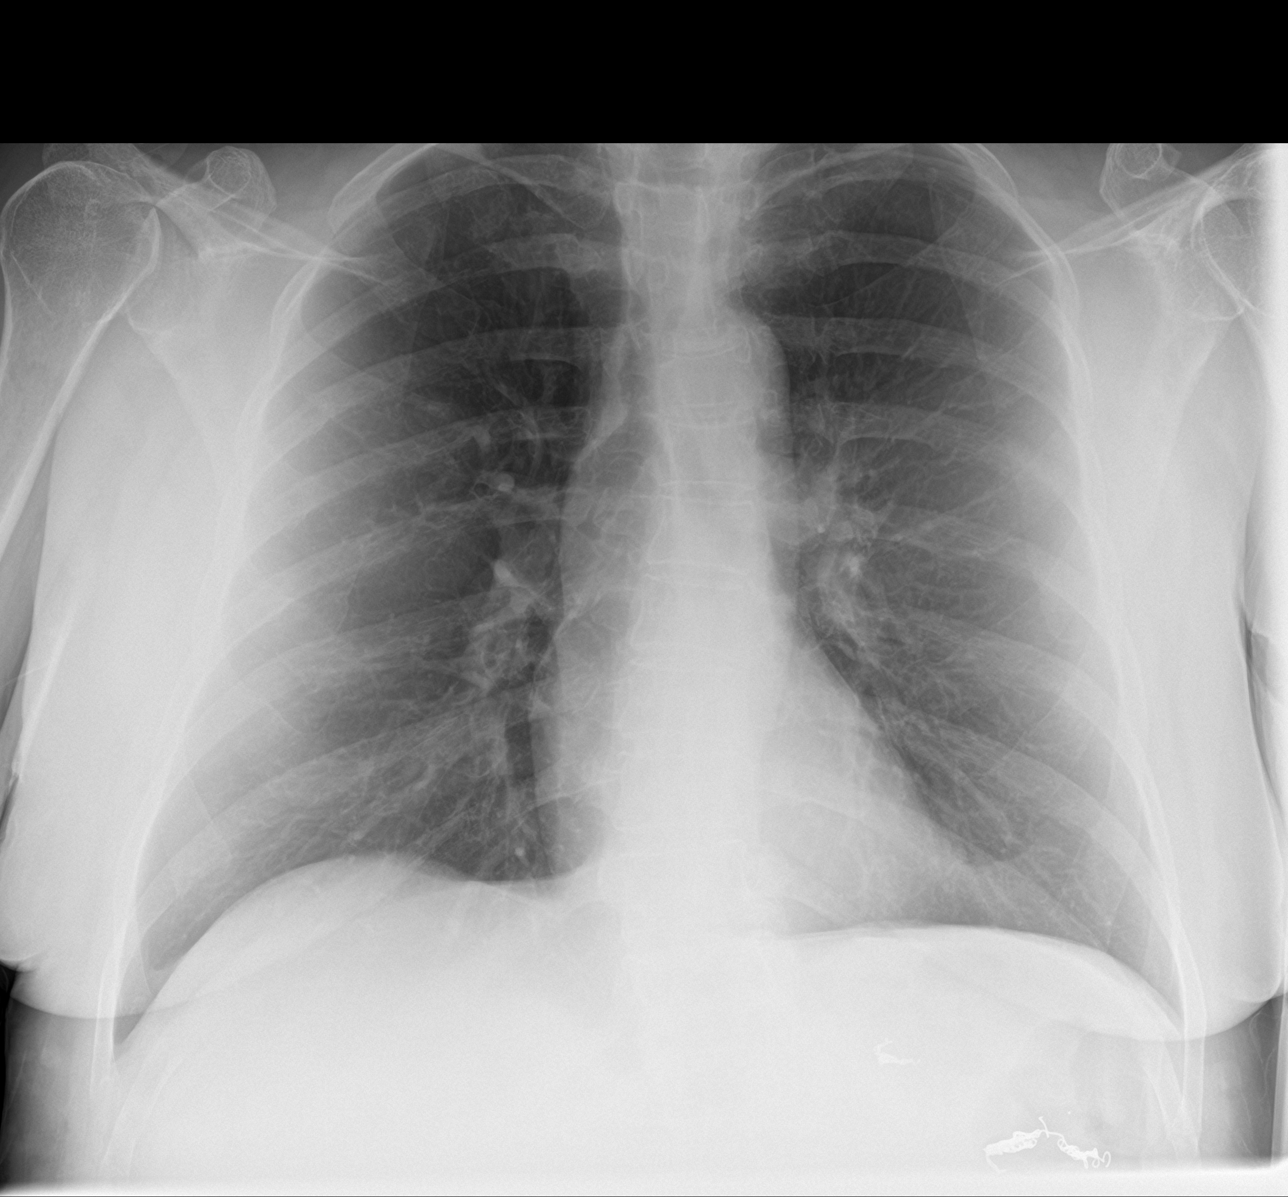
[im 2/4]
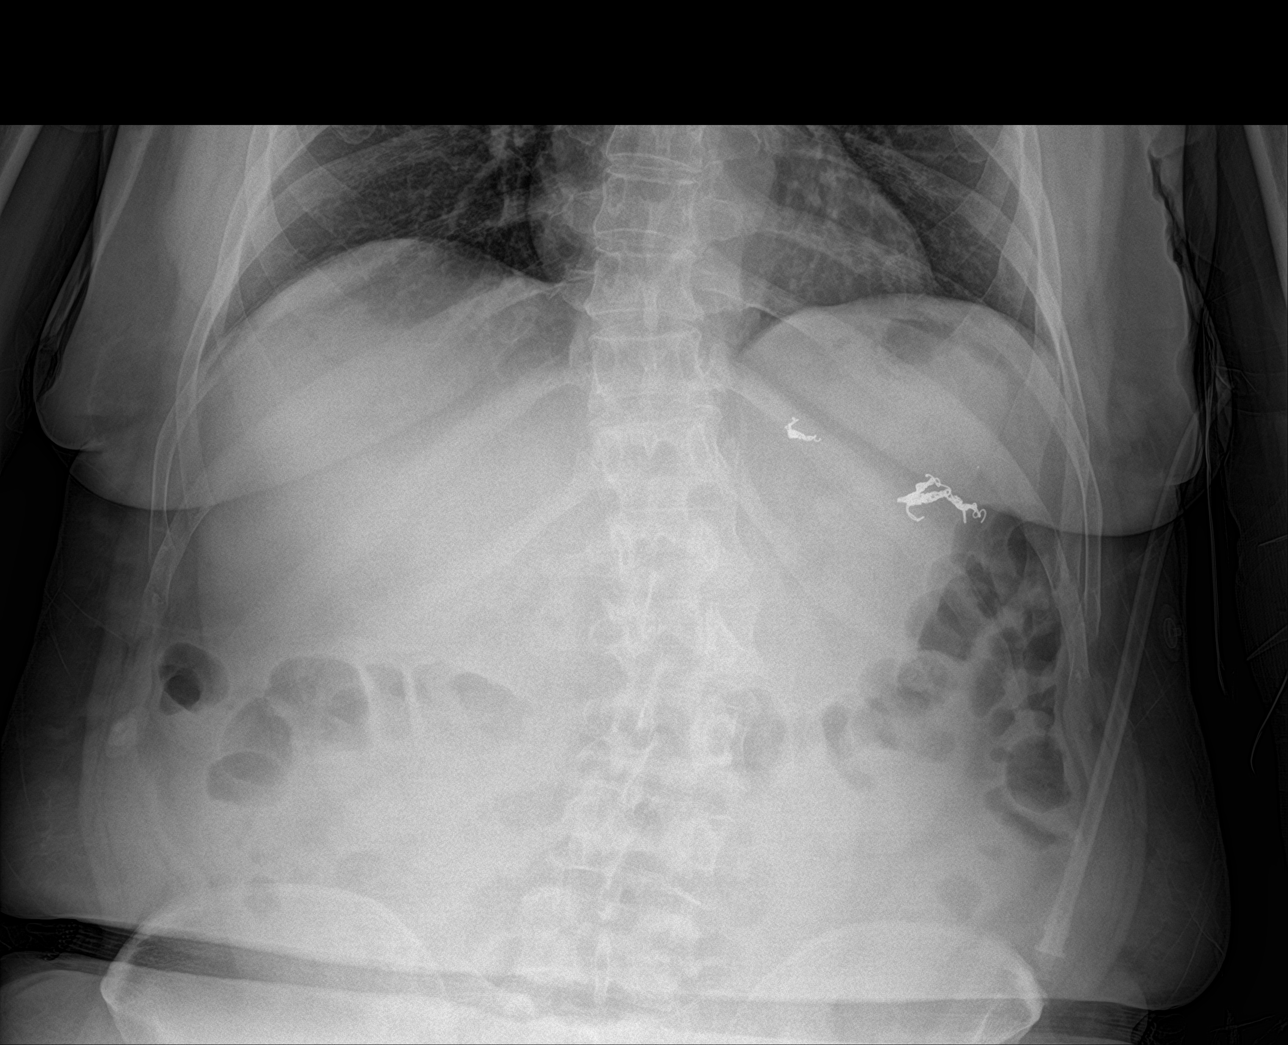
[im 3/4]
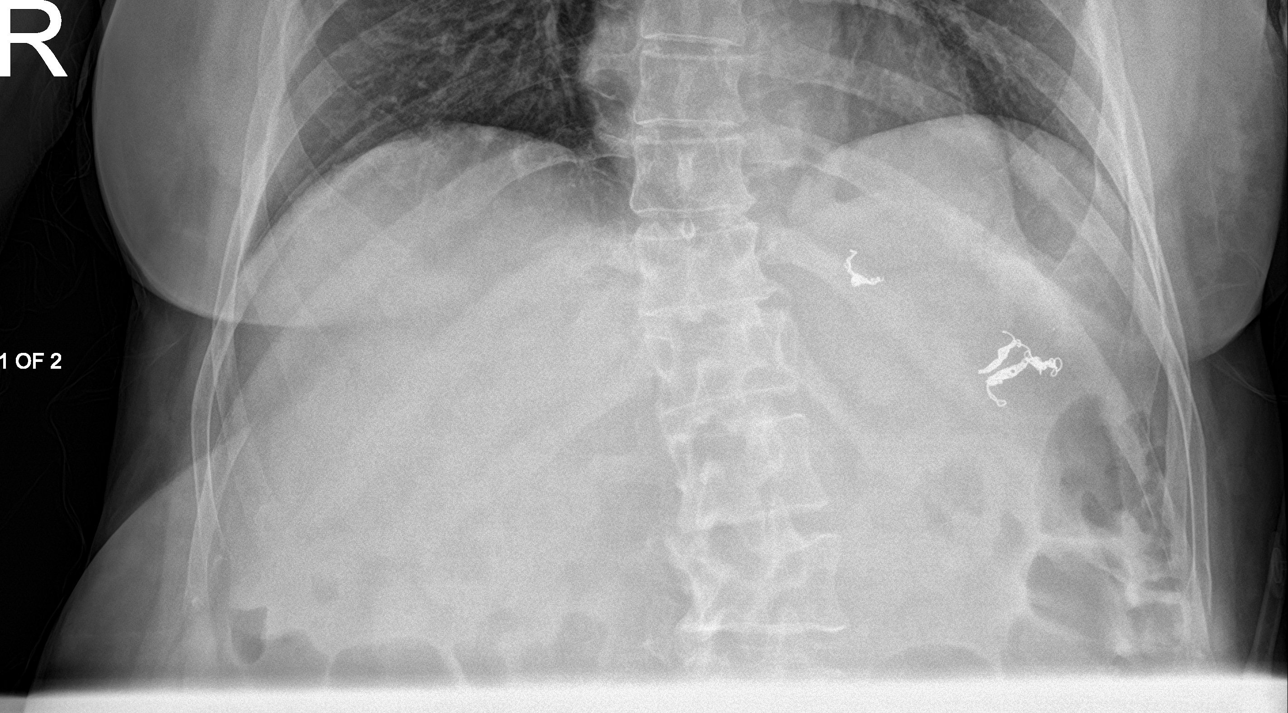
[im 4/4]
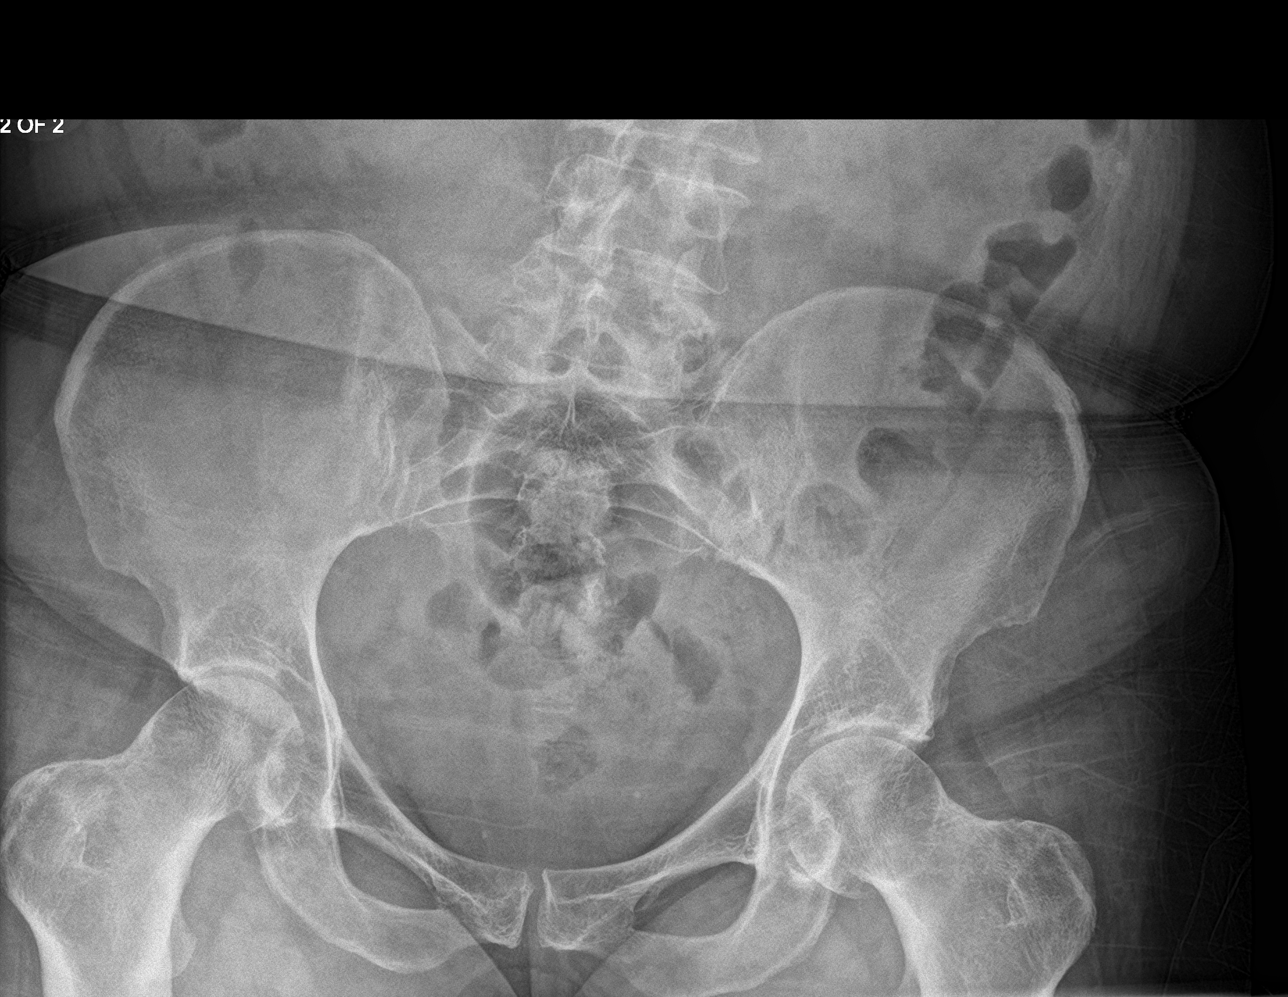

[4 of 4 positions shown; findings below may reference images not displayed]

FINDINGS: Embolization coils noted within the left upper quadrant of the
abdomen. There is no evidence of dilated bowel loops or free
intraperitoneal air. No radiopaque calculi or other significant
radiographic abnormality is seen. Heart size and mediastinal
contours are within normal limits. Both lungs are clear.
IMPRESSION: Negative abdominal radiographs.  No acute cardiopulmonary disease.

## 2022-07-12 IMAGING — US US HEPATIC LIVER DOPPLER
1 series · 13 of 25 positions shown · non-contrast
Comparison: None.

CLINICAL DATA: Cirrhosis and esophageal varices by EGD earlier
today

EXAM:
DUPLEX ULTRASOUND OF LIVER
TECHNIQUE: Color and duplex Doppler ultrasound was performed to evaluate the
hepatic in-flow and out-flow vessels.

[Series 1: us abdomen complete · 13 of 81 slices shown]
[im 1/81]
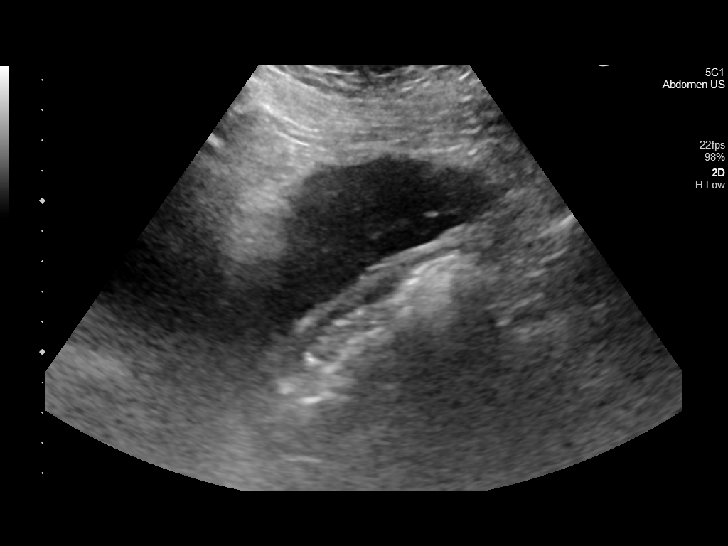
[im 7/81]
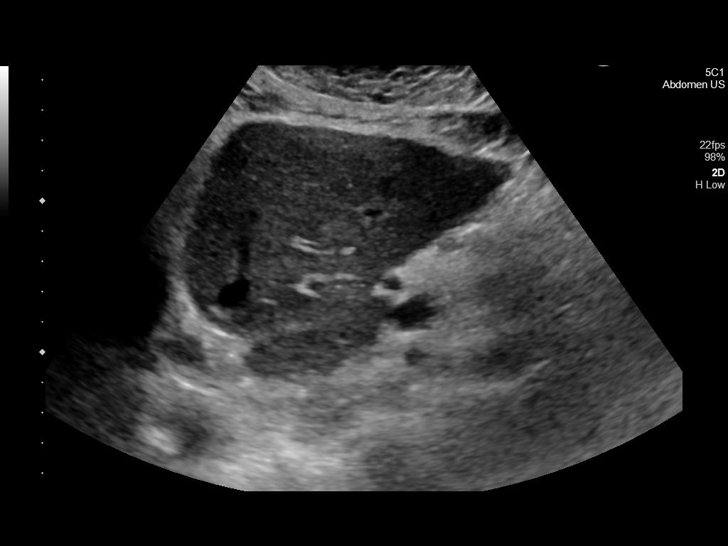
[im 14/81]
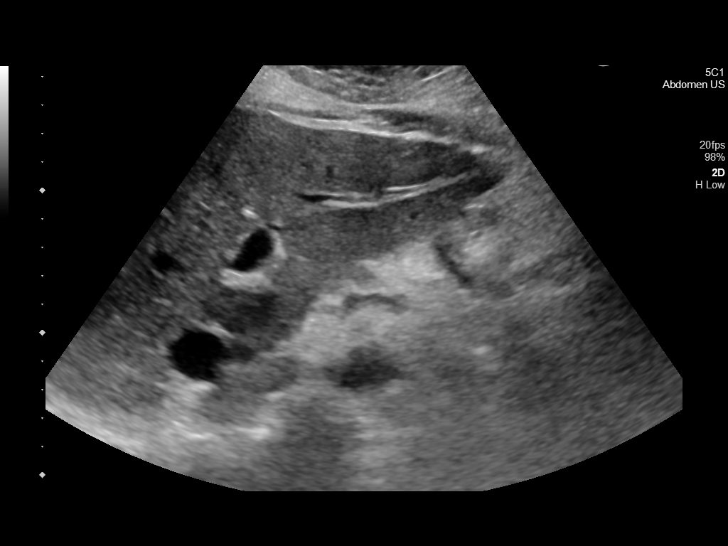
[im 21/81]
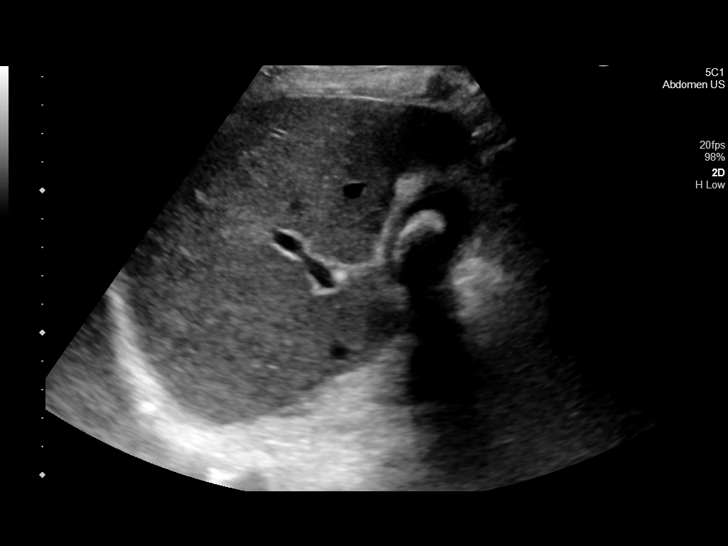
[im 27/81]
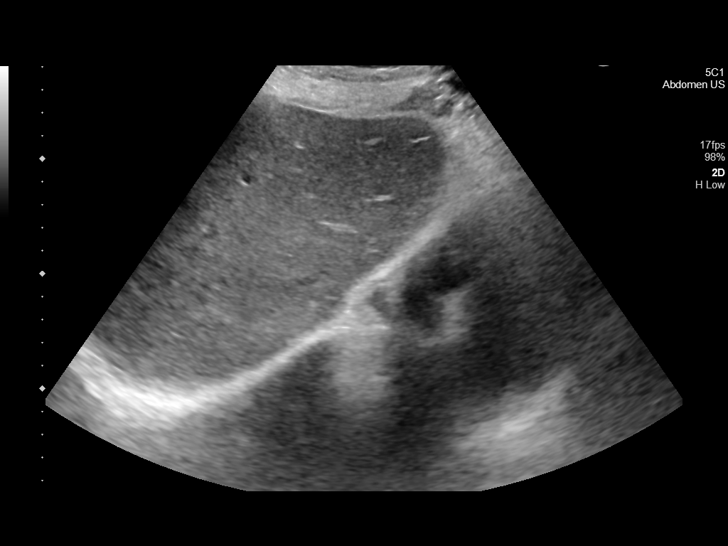
[im 34/81]
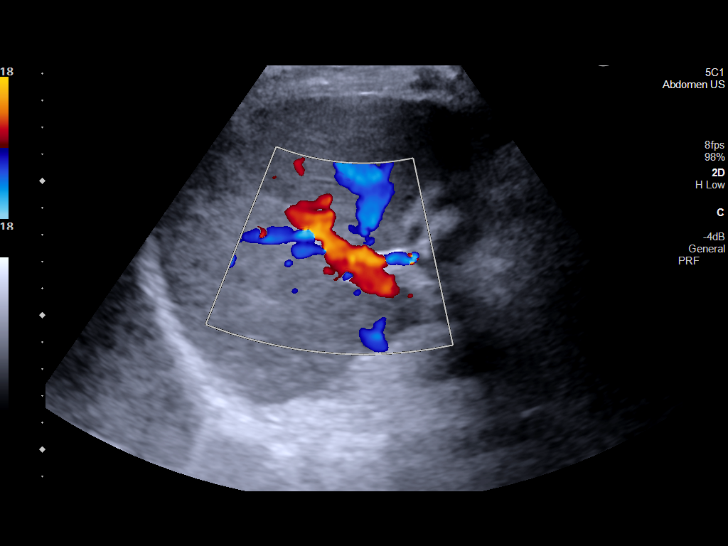
[im 41/81]
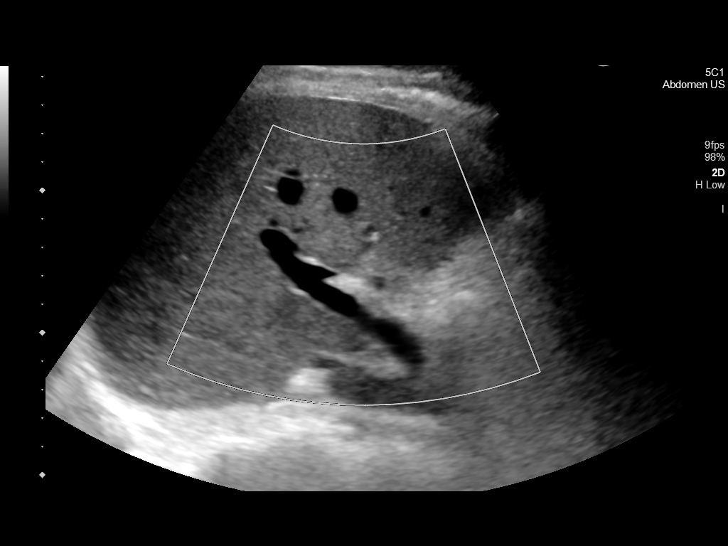
[im 47/81]
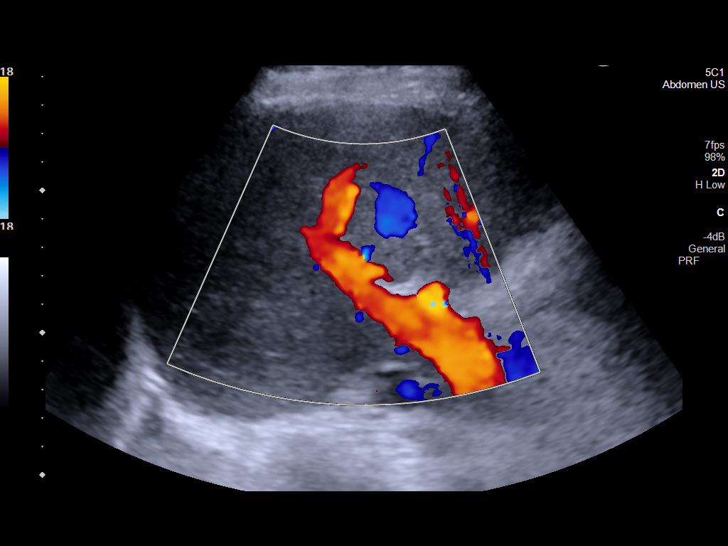
[im 54/81]
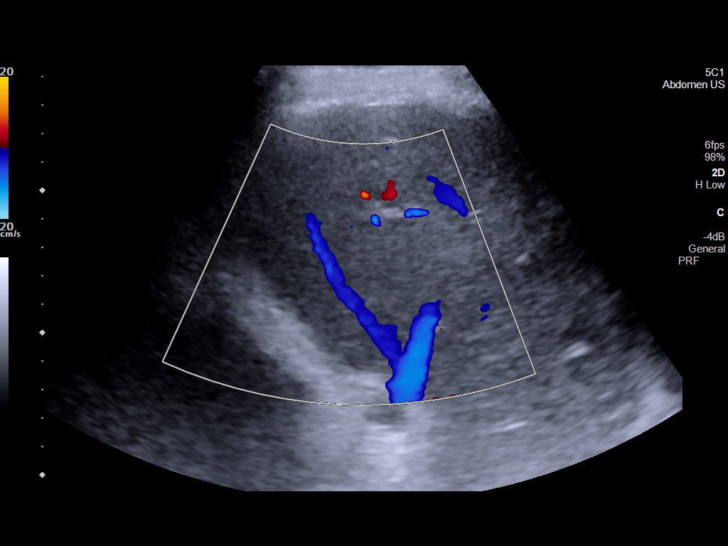
[im 61/81]
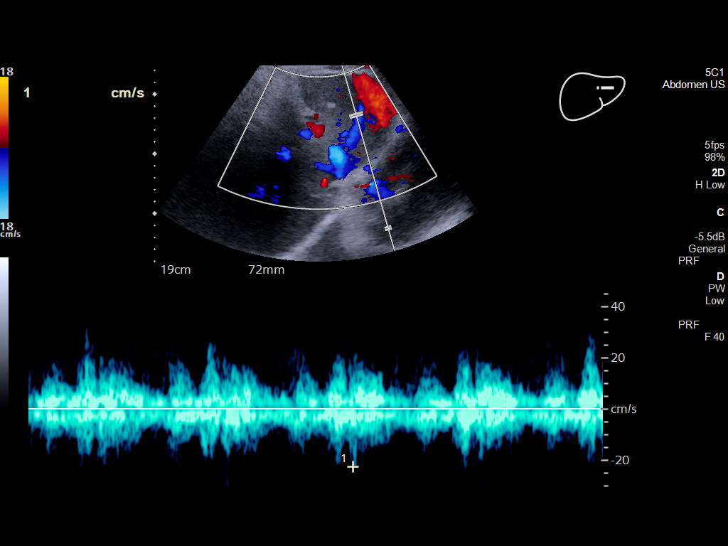
[im 67/81]
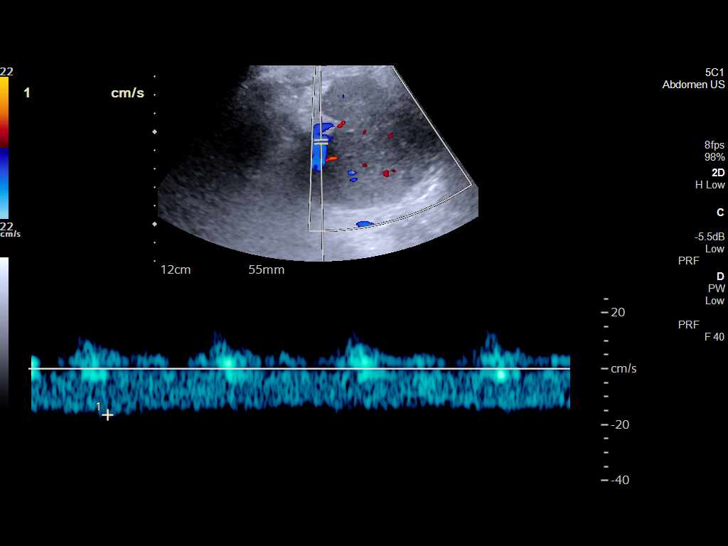
[im 74/81]
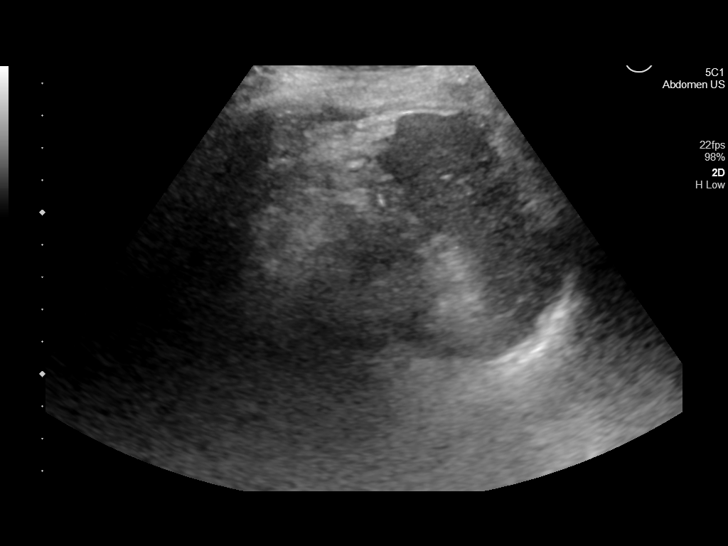
[im 81/81]
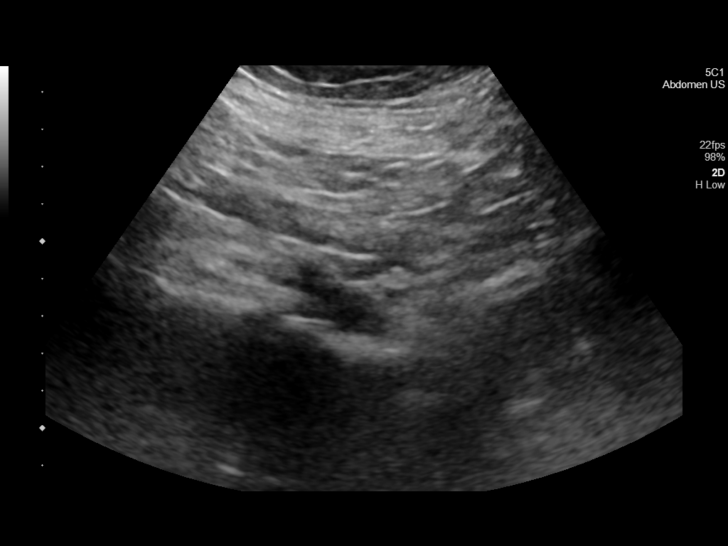

[13 of 25 positions shown; findings below may reference images not displayed]

FINDINGS: Liver: Normal echogenicity but mildly heterogeneous. Surface
nodularity compatible with cirrhosis. No focal lesion, mass or
intrahepatic biliary ductal dilatation.

Main Portal Vein size: 1.3 cm

Portal Vein Velocities

Main Prox:  33 cm/sec

Main Mid: 25 cm/sec

Main Dist:  29 cm/sec
Right: 12 cm/sec
Left: 16 cm/sec

Hepatic Vein Velocities

Right:  11 cm/sec

Middle:  16 cm/sec

Left:  22 cm/sec

IVC: Present and patent with normal respiratory phasicity.

Hepatic Artery Velocity:  90 cm/sec

Splenic Vein Velocity:  16 cm/sec

Spleen: 5.3 cm x 8.1 cm x 2.7 cm with a total volume of 59 cm^3
(411 cm^3 is upper limit normal)

Portal Vein Occlusion/Thrombus: No

Splenic Vein Occlusion/Thrombus: No

Ascites: Small amount of perihepatic ascites

Varices: Not appreciated by ultrasound

Patent portal, hepatic and splenic veins with normal directional
flow. Negative for portal vein occlusion or thrombus.
IMPRESSION: Hepatic cirrhosis.

Trace hepatic ascites

Patent portal, hepatic and splenic veins as above.

## 2022-11-19 ENCOUNTER — Other Ambulatory Visit: Payer: Self-pay
